# Patient Record
Sex: Female | Born: 1985
Health system: Southern US, Community
[De-identification: ages and names within clinical notes are randomized; demographics above are authoritative.]

## PROBLEM LIST (undated history)

## (undated) DIAGNOSIS — K219 Gastro-esophageal reflux disease without esophagitis: Secondary | ICD-10-CM

## (undated) DIAGNOSIS — F419 Anxiety disorder, unspecified: Secondary | ICD-10-CM

## (undated) DIAGNOSIS — U071 COVID-19: Secondary | ICD-10-CM

## (undated) DIAGNOSIS — M545 Low back pain: Secondary | ICD-10-CM

## (undated) DIAGNOSIS — R87619 Unspecified abnormal cytological findings in specimens from cervix uteri: Secondary | ICD-10-CM

## (undated) DIAGNOSIS — G43909 Migraine, unspecified, not intractable, without status migrainosus: Secondary | ICD-10-CM

## (undated) DIAGNOSIS — T7840XA Allergy, unspecified, initial encounter: Secondary | ICD-10-CM

## (undated) DIAGNOSIS — G8929 Other chronic pain: Secondary | ICD-10-CM

## (undated) DIAGNOSIS — I499 Cardiac arrhythmia, unspecified: Secondary | ICD-10-CM

## (undated) DIAGNOSIS — G459 Transient cerebral ischemic attack, unspecified: Secondary | ICD-10-CM

## (undated) DIAGNOSIS — R011 Cardiac murmur, unspecified: Secondary | ICD-10-CM

## (undated) DIAGNOSIS — K8020 Calculus of gallbladder without cholecystitis without obstruction: Secondary | ICD-10-CM

## (undated) HISTORY — PX: BREAST ENHANCEMENT SURGERY: SHX7

## (undated) HISTORY — DX: Unspecified abnormal cytological findings in specimens from cervix uteri: R87.619

## (undated) HISTORY — PX: ENDOSCOPIC RETROGRADE CHOLANGIOPANCREATOGRAPHY (ERCP) WITH PROPOFOL: SHX5810

## (undated) HISTORY — DX: Low back pain: M54.5

## (undated) HISTORY — DX: Allergy, unspecified, initial encounter: T78.40XA

## (undated) HISTORY — DX: Transient cerebral ischemic attack, unspecified: G45.9

## (undated) HISTORY — DX: Calculus of gallbladder without cholecystitis without obstruction: K80.20

## (undated) HISTORY — PX: GALLBLADDER SURGERY: SHX652

## (undated) HISTORY — PX: ABDOMINOPLASTY: SUR9

## (undated) HISTORY — PX: OTHER SURGICAL HISTORY: SHX169

## (undated) HISTORY — DX: Cardiac murmur, unspecified: R01.1

## (undated) HISTORY — DX: Other chronic pain: G89.29

## (undated) HISTORY — DX: COVID-19: U07.1

## (undated) HISTORY — DX: Anxiety disorder, unspecified: F41.9

---
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
## 2004-06-08 ENCOUNTER — Other Ambulatory Visit: Admission: RE | Admit: 2004-06-08 | Discharge: 2004-06-08 | Payer: Self-pay | Admitting: Obstetrics & Gynecology

## 2005-08-25 ENCOUNTER — Ambulatory Visit (HOSPITAL_COMMUNITY): Admission: AD | Admit: 2005-08-25 | Discharge: 2005-08-25 | Payer: Self-pay | Admitting: Obstetrics and Gynecology

## 2005-08-30 ENCOUNTER — Inpatient Hospital Stay (HOSPITAL_COMMUNITY): Admission: AD | Admit: 2005-08-30 | Discharge: 2005-08-31 | Payer: Self-pay | Admitting: Obstetrics and Gynecology

## 2005-09-03 ENCOUNTER — Ambulatory Visit (HOSPITAL_COMMUNITY): Admission: AD | Admit: 2005-09-03 | Discharge: 2005-09-03 | Payer: Self-pay | Admitting: Obstetrics and Gynecology

## 2005-09-08 ENCOUNTER — Inpatient Hospital Stay (HOSPITAL_COMMUNITY): Admission: AD | Admit: 2005-09-08 | Discharge: 2005-09-10 | Payer: Self-pay | Admitting: Obstetrics and Gynecology

## 2005-09-09 ENCOUNTER — Encounter: Payer: Self-pay | Admitting: Obstetrics and Gynecology

## 2005-09-28 ENCOUNTER — Emergency Department (HOSPITAL_COMMUNITY): Admission: EM | Admit: 2005-09-28 | Discharge: 2005-09-28 | Payer: Self-pay | Admitting: Emergency Medicine

## 2005-10-05 ENCOUNTER — Emergency Department (HOSPITAL_COMMUNITY): Admission: EM | Admit: 2005-10-05 | Discharge: 2005-10-05 | Payer: Self-pay | Admitting: Emergency Medicine

## 2005-10-08 ENCOUNTER — Ambulatory Visit (HOSPITAL_COMMUNITY): Admission: RE | Admit: 2005-10-08 | Discharge: 2005-10-08 | Payer: Self-pay | Admitting: Obstetrics and Gynecology

## 2005-11-09 ENCOUNTER — Emergency Department (HOSPITAL_COMMUNITY): Admission: EM | Admit: 2005-11-09 | Discharge: 2005-11-09 | Payer: Self-pay | Admitting: Emergency Medicine

## 2006-01-29 ENCOUNTER — Ambulatory Visit (HOSPITAL_COMMUNITY): Admission: RE | Admit: 2006-01-29 | Discharge: 2006-01-29 | Payer: Self-pay | Admitting: Family Medicine

## 2008-06-19 ENCOUNTER — Emergency Department (HOSPITAL_COMMUNITY): Admission: EM | Admit: 2008-06-19 | Discharge: 2008-06-19 | Payer: Self-pay | Admitting: Emergency Medicine

## 2008-07-02 ENCOUNTER — Emergency Department (HOSPITAL_COMMUNITY): Admission: EM | Admit: 2008-07-02 | Discharge: 2008-07-02 | Payer: Self-pay | Admitting: Emergency Medicine

## 2008-08-01 ENCOUNTER — Emergency Department (HOSPITAL_COMMUNITY): Admission: EM | Admit: 2008-08-01 | Discharge: 2008-08-01 | Payer: Self-pay | Admitting: Emergency Medicine

## 2008-09-14 ENCOUNTER — Other Ambulatory Visit: Admission: RE | Admit: 2008-09-14 | Discharge: 2008-09-14 | Payer: Self-pay | Admitting: Obstetrics & Gynecology

## 2009-05-04 ENCOUNTER — Emergency Department (HOSPITAL_COMMUNITY): Admission: EM | Admit: 2009-05-04 | Discharge: 2009-05-04 | Payer: Self-pay | Admitting: Emergency Medicine

## 2009-11-06 ENCOUNTER — Other Ambulatory Visit: Admission: RE | Admit: 2009-11-06 | Discharge: 2009-11-06 | Payer: Self-pay | Admitting: Obstetrics & Gynecology

## 2010-10-10 ENCOUNTER — Other Ambulatory Visit: Admission: RE | Admit: 2010-10-10 | Discharge: 2010-10-10 | Payer: Self-pay | Admitting: Obstetrics and Gynecology

## 2011-02-22 ENCOUNTER — Emergency Department (HOSPITAL_COMMUNITY)
Admission: EM | Admit: 2011-02-22 | Discharge: 2011-02-22 | Disposition: A | Discharge status: Home / Self Care | Payer: Self-pay | Attending: Emergency Medicine | Admitting: Emergency Medicine

## 2011-02-22 ENCOUNTER — Emergency Department (HOSPITAL_COMMUNITY): Payer: Self-pay

## 2011-02-22 DIAGNOSIS — M25579 Pain in unspecified ankle and joints of unspecified foot: Secondary | ICD-10-CM | POA: Insufficient documentation

## 2011-04-09 LAB — GC/CHLAMYDIA PROBE AMP, GENITAL
Chlamydia, DNA Probe: NEGATIVE
GC Probe Amp, Genital: NEGATIVE

## 2011-04-09 LAB — URINALYSIS, ROUTINE W REFLEX MICROSCOPIC
Hgb urine dipstick: NEGATIVE
Nitrite: NEGATIVE
Protein, ur: NEGATIVE mg/dL
Urobilinogen, UA: 0.2 mg/dL (ref 0.0–1.0)

## 2011-05-09 ENCOUNTER — Other Ambulatory Visit (HOSPITAL_COMMUNITY)
Admission: RE | Admit: 2011-05-09 | Discharge: 2011-05-09 | Disposition: A | Discharge status: Home / Self Care | Payer: Medicaid Other | Source: Ambulatory Visit | Attending: Obstetrics & Gynecology | Admitting: Obstetrics & Gynecology

## 2011-05-09 ENCOUNTER — Other Ambulatory Visit: Payer: Self-pay | Admitting: Obstetrics & Gynecology

## 2011-05-09 DIAGNOSIS — Z113 Encounter for screening for infections with a predominantly sexual mode of transmission: Secondary | ICD-10-CM | POA: Insufficient documentation

## 2011-05-09 DIAGNOSIS — Z01419 Encounter for gynecological examination (general) (routine) without abnormal findings: Secondary | ICD-10-CM | POA: Insufficient documentation

## 2011-05-17 NOTE — Discharge Summary (Signed)
[facility]  Patient:    Marissa Burnett, Marissa Burnett Visit Number: [id number] MRN: [id number]          Service Type: OBS Location: 4A A418 01 Attending Physician:  Jeri Cos. Dictated by:   Langley Gauss, M.D. Admit Date:  10/28/2001 Discharge Date: 10/30/2001   CC:         Dennie Maizes, M.D., [facility]   Discharge Summary  DIAGNOSES: 1. Twenty-four-week intrauterine pregnancy. 2. Right pyelonephritis. 3. Marked moderate-to-severe right hydronephrosis.  CONSULTATIONS OBTAINED:  Dr. Dennie Maizes was consulted and he saw the patient on October 29, 2001, at which time, on the same date, he took the patient to the operating room.  Utilizing general anesthesia, a stent was placed within the right ureter.  Patient did well postoperatively.  She had no postoperative complications.  She was continued on Rocephin 1 g IV q.12h. postoperatively.  Patient did have remarkable improvement in the pain following stent placement such that on October 30, 2001, patient was discharged home.  She had not taken any pain medication postoperatively.  PERTINENT LABORATORY STUDIES:  A urine culture obtained upon admission did reveal 50,000 colonies per milliliter of mixed bacteria.  Hemoglobin 10.7, hematocrit 31.3, white blood count of 5.8.  Urinalysis pertinent for negative hematuria, negative nitrite, negative esterase.  HOSPITAL COURSE:  Patient was seen in the office October 28, 2001 with continued right flank pain which had increased over the previous several days. Patient had been afebrile and had no urinary tract symptoms.  She was taking p.o. antibiotics at that time but had difficulty taking these and vomited several of these recently.  She likewise was on p.o. Tylox which failed to continue to give therapeutic results.  Patient had a renal ultrasound performed on October 28, 2001, with the final report being marked-to-severe right hydronephrosis,  thus patient was referred to [facility] for [facility] on October 28, 2001.  Dr. Rito Ehrlich was consulted and he saw the patient on October 29, 2001.  Upon admission on October 28, 2001, patient required IV Demerol and IV Phenergan for pain relief.  She, however, did remain afebrile.  She was treated with Rocephin 1 g IV q.12h.  She remained afebrile, voided without difficulty.  She had no gross hematuria.  The stent was placed on October 29, 2001 without difficulty.  Patient was prepared for discharge on October 30, 2001. Dictated by:   Langley Gauss, M.D.  Attending Physician:  Jeri Cos. DD:  10/30/01 TD:  11/02/01 Job: [id number]

## 2011-05-17 NOTE — H&P (Signed)
NAMEIRISA, GRIMSLEY            ACCOUNT NO.:  [id number]   MEDICAL RECORD NO.:  [id number]          PATIENT TYPE:  OIB   LOCATION:  LDR2                          FACILITY:  [facility]   PHYSICIAN:  Tilda Burrow, M.D. DATE OF BIRTH:  January 21, 1986   DATE OF ADMISSION:  09/08/2005  DATE OF DISCHARGE:  LH                                HISTORY & PHYSICAL   ADMISSION DIAGNOSES:  1.  Pregnancy, 38-1/2 weeks' gestation.  2.  Prodromal labor.   HISTORY OF PRESENT ILLNESS:  This 25 year old female, gravida 6, para 1, AB  4, LMP December 13, 2004, placing menstrual [facility] September 19, 2005, is seen  in labor and delivery after pregnancy course followed through our office  through 16 prenatal visits so far and several visits to labor and delivery.  She has had lots of prodromal labor symptoms over the last few weeks.  She  presents tonight with the strongest contractions yet.  Cervix is 3 cm, 20%,  -2, by nursing evaluation, 2-3 cm by my exam.  Contractions were more  regular than they have been on prior visits to labor and delivery with  contractions every 3 minutes, tolerated adequately by the patient who claims  epidural once labor gets going.  The patient is admitted and after  discussion, plans are to insert the Foley balloon overnight and if labor  does not spontaneously result, proceed with Pitocin in a.m.   PAST MEDICAL HISTORY:  Benign.   PAST SURGICAL HISTORY:  Cholecystectomy.   ALLERGIES:  PENICILLIN.   GYN HISTORY:  Positive for abnormal Pap smear requiring colposcopy.   LABORATORY DATA:  Prenatal labs include blood type O positive.  Urine drug  screen negative.  Rubella immune.  _present__Hemoglobin 13, hematocrit 39.  Hepatitis, HIV, GC, Chlamydia, RPR, MSA between 1 and 4300.  The patient's  partner, Kathryne Eriksson, is supportive.  She desires epidural.   PLAN:  Balloon dilation overnight, Pitocin in a.m.  Epidural for analgesia.      Tilda Burrow, M.D.  Electronically Signed     JVF/MEDQ  D:  09/08/2005  T:  09/08/2005  Job:  [id number]   cc:   Donna Bernard, M.D.  [address]  [place]  [place] [address]  Fax: [phone]
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
## 2011-05-17 NOTE — Op Note (Signed)
[name], [name]            ACCOUNT NO.:  [id number]   MEDICAL RECORD NO.:  [id number]          PATIENT TYPE:  INP   LOCATION:  LDR2                          FACILITY:  [facility]   PHYSICIAN:  Tilda Burrow, M.D. DATE OF BIRTH:  Jul 11, 1986   DATE OF PROCEDURE:  DATE OF DISCHARGE:                                  PROCEDURE NOTE   DELIVERY NOTE:  Onset of labor on September 09, 2005.  Date of delivery  September 09, 2005, at 7:57 a.m.  Length of first stage labor 3 hours and 52  minutes.  Length of second stage of labor 5 minutes.  Length of third stage  labor 3 minutes.   Marissa Burnett had a normal spontaneous vaginal delivery of a viable female infant  weighing 8 pounds 1 ounce with Apgar's 9 and 9.  Upon delivery of head, nose  and mouth were thoroughly suctioned with DeLee suctioning on the perineum  due to light meconium staining.  Approximately 4 mL of very light meconium  fluid was noted in the DeLee suction.  Shoulders were rotated and delivered  spontaneously without difficulty.  The infant was suctioned again, dried,  cord clamped and cut and to nurses for newborn care.  Third stage of labor  was actually managed with 20 units of Pitocin and 1000 mL of D-5LR at a  rapid rate.  The placenta was delivered spontaneously.  Three-cord was noted  upon inspection.  Membranes were noted to be intact upon inspection.  Estimated blood loss approximately 300 mL.  Perineum was noted to be intact  upon inspection.  Epidural catheter was removed with blue tip intact.  The  placenta will be sent to pathology for evaluation due to meconium staining.      Marissa Burnett, Marissa Burnett      Tilda Burrow, M.D.  Electronically Signed    DL/MEDQ  D:  91/[id number]/[id number]  T:  09/09/2005  Job:  [id number]

## 2011-05-17 NOTE — Discharge Summary (Signed)
Marissa Burnett, Marissa Burnett            ACCOUNT NO.:  [id number]   MEDICAL RECORD NO.:  [id number]          PATIENT TYPE:  INP   LOCATION:  A415                          FACILITY:  [facility]   PHYSICIAN:  Lazaro Arms, M.D.   DATE OF BIRTH:  [birth year]   DATE OF ADMISSION:  08/30/2005  DATE OF DISCHARGE:  LH                                 DISCHARGE SUMMARY   LABOR AND DELIVER OBSERVATION NOTE:  She came in on September 1 and left on  September 2.   Marissa Burnett is a 25 year old gravida 6, para 1, abortion 2, miscarriage 2 with  estimated deliver of September 19, 2005, currently at 37-2/[redacted] weeks gestation  presenting complaining of irregular uterine contractions. She came in having  irregular contractions anywhere from 2 to 14 minutes. They were very mild.  There was no rupture of membranes, no bleeding, good fetal movement. She got  a reactive NST. She was 2 cm when she came in, thick and posterior. We kept  her basically all night. Approximately 12 hours later, her cervix was  unchanged, and again, she was having just irregular uterine activity. Baby  was reactive, and no other changes in her clinical status. As a result, she  is discharged to home to follow up in the office as scheduled and given  labor precautions and instructions.      Lazaro Arms, M.D.  Electronically Signed     LHE/MEDQ  D:  08/31/2005  T:  08/31/2005  Job:  [id number]

## 2011-05-17 NOTE — Discharge Summary (Signed)
[facility]  Patient:    Marissa Burnett, Marissa Burnett Visit Number: [id number] MRN: [id number]          Service Type: OBS Location: 4A A418 01 Attending Physician:  Jeri Cos. Dictated by:   Langley Gauss, M.D. Admit Date:  10/28/2001 Discharge Date: 10/30/2001                             Discharge Summary  DISCHARGE DIAGNOSES:  A 24 week intrauterine pregnancy with right pyelonephritis complicated by moderate to severe hydronephrosis.  The patient was admitted and was treated with IV Rocephin.  Dr. Dennie Maizes, was consulted who placed a stent in the patient on October 29, 2001. On October 30, 2001, the patient was prepared for discharge.  Final cultures:  50,000 mixed culture.  DISCHARGE MEDICATIONS: 1. Keflex 500 mg p.o. b.i.d. x 7 days to be followed by 2. Macrodantin 100 mg p.o. q.h.s. suppressive therapy.  On October 28, 2001, initial hospital care [id number], 50 minutes spent with the patient and on the floor.  On October 29, 2001, subsequent inpatient services [phone].  On October 30, 2001, hospital discharge services [phone]. Dictated by:   Langley Gauss, M.D. Attending Physician:  Jeri Cos. DD:  11/02/01 TD:  11/03/01 Job: [id number]

## 2011-05-17 NOTE — Op Note (Signed)
[facility]  Patient:    Marissa Burnett, Marissa Burnett Visit Number: [id number] MRN: [id number]          Service Type: OBS Location: 4A A418 01 Attending Physician:  Jeri Cos. Dictated by:   Langley Gauss, M.D. Proc. Date: 01/27/02 Admit Date:  01/27/2002                             Operative Report  PROCEDURE: Placement of continuous lumbar epidural analgesia at the L3-L4 interspace, performed by Langley Gauss, M.D.  COMPLICATIONS:  None.  SUMMARY: With the outset of active labor and ______ uterine contractions, the patient requested epidural analgesic, continuous, and electronic fetal monitoring was performed.  The risks and benefits of the procedure had been discussed during the patients prenatal course. She was placed in the seated position, at which time bony landmarks were identified. The L3-L4 interspace was chosen.  The patients back was sterilely prepped and draped utilizing the epidural kit.  5 cc of 1% lidocaine were injected at the midline of the L3-L4 interspace to raise a small skin wheal.  The gauge Tuohy-Schliff needle was then utilized with loss of resistance, and air-filled ______ identified entry into the epidural space on the first attempt ______.  Excellent loss of resistance was noted, consistent with entry into the epidural space.  Initial test dose of 5 cc of 1.5% lidocaine plus epinephrine injected through the epidural needle.  No signs of CSF or intravascular injection obtained.  With this injection, thus the catheter was inserted to a depth of about 4 cm, and the epidural needle was removed.  Aspiration test was negative.  A second test dose, 2 cc of 1.5% lidocaine plus epinephrine injected through the epidural catheter.  Again, no signs of CSF or intravascular injection obtained, the patient having tingling in the buttocks bilaterally, consistent with a proper setting up by epidural block.  Likewise, she was noted to have  appropriate changes in blood pressure.  The catheter was secured into place.  The patient was connected to the infusion pump containing the standard mixture.  She was treated with a bolus of 10 cc, followed by continuous infusion rate of 14 cc per hour.  Upon return to the bed in the left lateral position, the patient was noted to have significant heaviness in the legs, consistent with a properly setting up epidural block, as well as onset of effective labor analgesia. The patient had felt pressure during placement of the epidural. Examination immediately following placement revealed the cervix to be 8 cm dilated, vertex at +1 station, and completely effaced with a continuing reassuring fetal heart rate.  The patient is effectively contracting q. 3-5 minutes at this time.  Now, she is allowed to continue to labor with expectation that the labor will be progressing rapidly. Dictated by:   Langley Gauss, M.D. Attending Physician:  Jeri Cos. DD:  01/29/02 TD:  01/29/02 Job: [phone]

## 2011-05-17 NOTE — H&P (Signed)
[facility]  Patient:    Marissa Burnett, Marissa Burnett Visit Number: [id number] MRN: [id number]          Service Type: OBS Location: 4A A428 01 Attending Physician:  Jeri Cos. Dictated by:   Christin Bach, M.D. Admit Date:  10/22/2001   CC:         Langley Gauss, M.D.   History and Physical  CHIEF COMPLAINT: Back pain, nausea and vomiting, low-grade temperature.  HISTORY OF PRESENT ILLNESS: This 25 year old female, gravida 2 para 0 AB 1, LMP May 03, 2001, placing menstrual [facility] February 10, 2002, placing her now at [redacted] weeks gestation, is admitted after presenting to labor and delivery complaining of nausea and vomiting and back pain throughout the day.  The patient is found on admission to have low-grade temperature of 99.2 degrees, pulse 102, with urinalysis showing presence of urine nitrites and estrace, with negative protein and negative blood.  Physical examination is notable for mild right-sided CVA tenderness.  The patient is admitted for early pyelonephritis.  Prenatal course has been followed through Dr. Royetta Crochet office, and notable for birth control pills taken around the time of conception, with several pills forgotten.  The patient had a history of kidney infection at age [age].  Prenatal laboratories included GC and Chlamydia culture negative, hemoglobin 12, hematocrit 36, blood type O-positive, antibody screen negative. hepatitis and HIV all negative.  Rubella immune to present.  Triple screen negative.  ALLERGIES:  1. PENICILLIN (?).  2. CODEINE (?).  PHYSICAL EXAMINATION:  GENERAL: Generally healthy-appearing Caucasian female, alert and oriented x 3.  HEENT: PERRL.  EOMI.  NECK: Supple.  Trachea midline.  CHEST: Clear to auscultation.  ABDOMEN: Nontender.  BACK: Right CVA tenderness.  PELVIC: External monitoring shows no uterine contractions.  Cervical examination by nurse shows the cervix to be  closed.  EXTREMITIES: Grossly normal.  ASSESSMENT: Right pyelonephritis, mild.  PLAN: IV antibiotics x 24-48 hours. Dictated by:   Christin Bach, M.D. Attending Physician:  Jeri Cos. DD:  10/22/01 TD:  10/24/01 Job: [id number]

## 2011-05-17 NOTE — Consult Note (Signed)
NAMEMADDELYN, ROCCA            ACCOUNT NO.:  [id number]   MEDICAL RECORD NO.:  [id number]          PATIENT TYPE:  OIB   LOCATION:  LDR1                          FACILITY:  [facility]   PHYSICIAN:  Tilda Burrow, M.D. DATE OF BIRTH:  08/16/1986   DATE OF CONSULTATION:  DATE OF DISCHARGE:                                   CONSULTATION   OBSERVATION NOTE:  Observation x2+ hours 1557 to 1977.   CHIEF COMPLAINT:  Contractions overnight, nausea and diarrhea.   HISTORY AND PHYSICAL:  [age]-year-old female gravida 6, para 1, AB 4 now  at 67 plus 4 weeks who presents with overnight discomforts beginning with  irregular contractions last night developing some nausea and loose bowel  movements.  She presented at 7 a.m. with complaints of vomiting x3 and the  mild contractions without bleeding or gush of fluid.  Fetal monitoring shows  excellent reactivity.  Exam by nurse showed cervix to be firm, long, closed,  unchanged from July 26, 2005 evaluation in our office.  Prenatal course is  noted in the prenatal records and records are reviewed.  She had a history  of Chlamydia noted on her 28-week labs which was treated on August 12, 2005  and proof of cure obtained August 21, 2005.  The results of that are  pending.  After 2 hours of monitoring the contractions have not increased,  the nausea has resolved, she has received oral Phenergan and kept it down  with increased comfort.  The plan is to discharge home with prescription  given for Phenergan 25 mg p.o. q.6 h. (dispense 10 tablets) with full liquid  diet for the day anticipate resolution of GI symptoms.  Return p.r.n.  deterioration of condition or increasing contractions.      Tilda Burrow, M.D.  Electronically Signed     JVF/MEDQ  D:  08/25/2005  T:  08/25/2005  Job:  [id number]

## 2011-05-17 NOTE — Op Note (Signed)
[facility]  Patient:    Marissa Burnett, Marissa Burnett Visit Number: [id number] MRN: [id number]          Service Type: OBS Location: 4A A418 01 Attending Physician:  Jeri Cos. Dictated by:   Langley Gauss, M.D. Proc. Date: 01/27/02 Admit Date:  01/27/2002                             Operative Report  DELIVERY PROCEDURE NOTE  DELIVERY PERFORMED BY:  Langley Gauss, M.D.  DIAGNOSIS:  A 38-week intrauterine pregnancy, in labor.  PROCEDURES: 1. Delivery form:  Spontaneous assisted vaginal delivery of a 7 pound 2 ounce    female infant. 2. Midline episiotomy and repair.  ANALGESIA:  Continuous lumbar epidural supplemented with 20 cc of 1% lidocaine in the midline of the perineal body.  SPECIMENS:  Arterial cord gas and cord blood to pathology and laboratory. Placenta is examined and noted to be apparently intact with a three-vessel umbilical cord.  SUMMARY:  The patient had been seen in the office on this date, January 27, 2002, at [redacted] weeks gestation, complaining of onset of uterine contractions since 0600.  She was referred to [facility], at which time she was noted to have contractions q.3-72m. throughout the day with associated cervical change to 3 cm.  Thus the patient was admitted in early labor.  Fetal scalp electrode was placed with resultant amniotomy.  The patient had a reassuring fetal heart rate.  Following amniotomy, spontaneously entered active labor.  With onset of painful uterine contractions, the patient requested epidural anesthesia, which was placed immediately.  Epidural functioned very well throughout the remainder of the labor course.  After placement of the epidural, the patient was noted to be 8 cm dilated with vertex at a +1 station.  The patient thereafter had effective labor analgesia and quickly progressed.  With onset of significant pelvic pressure, the patient was examined and noted to be completely dilated at a +1  station.  Thus, with continued pressure associated with uterine contractions, the patient began pushing with descent of the vertex to the +2 station.  She was placed in the dorsal lithotomy position and prepped and draped in the usual sterile manner.  She continued to push with easy descent of the vertex to the pelvic floor with distention of the perineum, and 20 cc of 1% lidocaine is injected.  A small midline episiotomy was performed.  The patient then pushed well with delivery in a direct OA position over the midline episiotomy without extension.  Mouth and ears of the infant were bulb-suctioned of clear amniotic fluid.  With good expulsive efforts that resulted in spontaneous rotation to a left anterior shoulder position, gentle abdominal traction combined with expulsive efforts resulted in delivery of the shoulder on pubic symphysis as well as the remainder of the infant without difficulty.  Spontaneous and vigorous breathe and cry is noted, and the umbilical cord is noted toward the infant.  The cord was doubly clamped and cut and infant is placed on the maternal abdomen for immediate bonding purposes.  Cord gas and cord blood were then obtained.  Gentle traction on the umbilical cord results in separation, which on examination appears to be intact three-vessel placenta and associated cord.  Excellent uterine tone is achieved immediately following delivery.  Examination of the genital tract reveals no lacerations, the midline episiotomy was not extended. This is easily repaired utilizing 0 chromic in a running  locked fashion on the vaginal mucosa, followed by two-layer closure of 0 chromic on the perineal body.  Following the repair, the patient is taken out of the lithotomy position and rolled to her side, at which time the epidural catheter is removed with the blue tip noted to be intact.  _____ to the nursing staff. The patient herself does plan on bottle-feeding.  She will be  utilizing [facility] for newborn pediatric care. Dictated by:   Langley Gauss, M.D. Attending Physician:  Jeri Cos. DD:  01/29/02 TD:  01/29/02 Job: [id number]

## 2011-05-17 NOTE — Op Note (Signed)
[facility]  Patient:    LESHEA, JAGGERS Visit Number: [id number] MRN: [id number]          Service Type: OBS Location: 4A A418 01 Attending Physician:  Jeri Cos. Dictated by:   Dennie Maizes, M.D. Proc. Date: 10/29/01 Admit Date:  10/28/2001 Discharge Date: 10/30/2001   CC:         Langley Gauss, M.D.   Operative Report  PREOPERATIVE DIAGNOSES:  Right hydronephrosis, pregnancy, history of acute right pyelonephritis.  POSTOPERATIVE DIAGNOSES:  Right hydronephrosis, pregnancy, history of acute right pyelonephritis.  OPERATIVE PROCEDURE:  Cystoscopy and right ureteral stent placement.  ANESTHESIA:  Spinal.  SURGEON:  Dennie Maizes, M.D.  COMPLICATIONS:  None.  CARDIAC:  A 6-French 26-cm size right ureteral stent.  INDICATION FOR THE PROCEDURE:  This 25 year old female is [redacted] weeks pregnant. She was treated for acute pyelonephritis last week.  She was admitted to the hospital with severe right flank pain and renal ultrasound revealed moderate-to-severe right hydronephrosis.  The patient is taken to the OR today for cystoscopy and right ureteral stent placement with ultrasound guidance.  DESCRIPTION OF PROCEDURE:  Spinal anesthesia was induced and the patient was placed on the OR table in the dorsal lithotomy position.  The lower abdomen and genitalia were prepped and draped in a sterile fashion.  Cystoscopy was done with a 25-French scope.  Appearance of bladder was normal.  There was mild hyperemia of the bladder mucosa, especially over the posterior bladder wall.  A 6-French open-ended catheter was then inserted into the right collecting system up to the level of about 20 cm.  A 0.0318 stent and guidewire with a flexible tip were now advanced into the renal pelvis. Ultrasound monitoring of the right kidney was done and the tip of the guidewire was found to be inside the renal pelvis.  The open-ended catheter was then removed.   A 6-French 26-cm size stent was then inserted over the guidewire into the collecting system.  The upper end of the stent could be easily identified in the renal pelvis.  The instruments were removed.  The patient was transferred to the PACU in a satisfactory condition. Dictated by:   Dennie Maizes, M.D. Attending Physician:  Jeri Cos. DD:  10/29/01 TD:  10/30/01 Job: [id number]

## 2011-05-17 NOTE — Op Note (Signed)
[facility]  Patient:    Marissa Burnett, Marissa Burnett Visit Number: [id number] MRN: [id number]          Service Type: DSU Location: DAY Attending Physician:  Jonathon Bellows Dictated by:   Roetta Sessions, M.D. Proc. Date: 03/25/02 Admit Date:  03/25/2002 Discharge Date: 03/25/2002   CC:         Dr. Justice Britain, [facility], [place], [place]   Operative Report  PREOPERATIVE DIAGNOSIS:  POSTOPERATIVE DIAGNOSIS:  OPERATION/PROCEDURE:  Endoscopic retrograde cholangiopancreatography with stent removal.  SURGEON:  Roetta Sessions, M.D.  INDICATIONS:  The patient is a 25 year old lady who presented to [facility] a few weeks ago with biliary colic.  She had symptomatic cholelithiasis and cholecystitis and evidence of a common duct stone.  She underwent ERCP by me.  She was found to have a common duct stone.  She underwent a sphincterotomy with balloon dredging.  The duct did not drain well and there was additional debris, in the duct, which could not be removed.  A 5-cm, 7 French stent was placed.  She did well.  She now returns for stent removal.  This approach has been discussed with the patients mother and the patient at length.  Just as before, the potential risks, benefits, and alternatives have been reviewed.  Specifically, the risk for perforation, pancreatitis, and reaction to medications.  DESCRIPTION OF PROCEDURE:  The patient was placed in the semiprone position on the fluoroscopy table.  She was given Versed 4 mg IV, Demerol 100 mg IV in divided doses.  She also received Phenergan 25 mg IV slowly over 10 minutes at the onset of the procedure.  Cetacaine spray for topical oropharyngeal anesthesia, and Levaquin 250 mg IV prior to procedure.  INSTRUMENT:  Olympus videochip side-viewing duodenoscope.  FINDINGS:  Cursory examination of the distal esophagus, stomach, and duodenum to the second portion appeared normal size and a 7 [place]  stent protruding into the ampullary orifice was evident from prior sphincterotomy.  The scope was pulled back to the short position; 65 cm from the incisors a scout film was taken.  Using the snare through the scope the stent was grasped and pulled out of the patient with the scope.  The scope was reintroduced into the duodenum.  Using the Bard sphincterotome the bile duct was easily cannulated.  Cholangiogram was obtained.  The residual biliary tree appeared normal.  There was no stricture or persistent filling defect.  The bile duct was not dilated.  The patient is status post sphincterotomy.  The bile freely flowed from the antral area orifice. Dr. Jean Rosenthal was present for fluoroscopy and agrees with the above assessment; and we have reviewed the films, after the procedure, and the films are consistent with the fluoroscopic images.  The patient tolerated the procedure well and was reacted in endoscopy.  IMPRESSION:  Normal appearing residual biliary tree, status post stent removal and prior laparoscopic cholecystectomy.  No residual filling defect.  Duct appeared to drain well.  RECOMMENDATIONS:  Clear liquids this afternoon, then advance to a light supper this evening then on to a regular diet tomorrow.  She is to call me if she has any problems.  I feel that her outlook is excellent. Dictated by:   Roetta Sessions, M.D. Attending Physician:  Jonathon Bellows DD:  03/25/02 TD:  03/27/02 Job: [id number] MW/[id number]

## 2011-05-17 NOTE — Op Note (Signed)
NAMEMORISSA, OBEIRNE            ACCOUNT NO.:  [id number]   MEDICAL RECORD NO.:  [id number]          PATIENT TYPE:  OIB   LOCATION:  LDR2                          FACILITY:  [facility]   PHYSICIAN:  Tilda Burrow, M.D. DATE OF BIRTH:  [birth year]   DATE OF PROCEDURE:  09/09/2005  DATE OF DISCHARGE:                                 OPERATIVE REPORT   PROCEDURE PERFORMED:  Epidural catheter placement.   Continuous lumbar epidural catheter placed using loss of resistance  technique after the patient was placed in sitting position, flexed forward  with some difficulty with patient cooperation.  Lydiann is quite thick with  lots of back edema.  It was technically challenging to identify epidural  space.  There was absolutely no sensation of identifying the posterior  spinous processes.  At the third epidural site attempt, we were eventually  able to identify an interspace and at the depth of 7 cm, we were able to  achieve a loss of resistance sensation desired at 7 cm depth.  The 5 mL test  dose was administered and the epidural catheter threaded easily 4 cm into  the epidural space and taped to the skin.  The patient had symmetric  analgesic effect at T10, achieved in a rather prompt fashion with motor  ability of moving the legs still intact.  This was felt to represent  effective epidural.  The epidural consisted of 10 mL bolus initially and 12  mL per hour.  Fetal heart rate remained stable with no blood pressure drop.  The patient tolerated the procedure well and proceeded in labor.  Cervical  exam post catheter showed the cervix to be 7 cm 85% effaced, -2 station  vertex.  The fore waters were generous and upon rupture showed light green  meconium discoloration with no particulate material.  Scalp electrode was  placed for fetal monitoring.      Tilda Burrow, M.D.  Electronically Signed     JVF/MEDQ  D:  09/09/2005  T:  09/09/2005  Job:  [id number]

## 2011-05-17 NOTE — H&P (Signed)
[facility]  Patient:    EMI, LYMON Visit Number: [id number] MRN: [id number]          Service Type: DSU Location: DAY Attending Physician:  Jonathon Bellows Dictated by:   Tana Coast, P.A. Admit Date:  03/25/2002 Discharge Date: 03/25/2002   CC:         Lilyan Punt, M.D.  Justice Britain, M.D.   History and Physical  DATE OF OFFICE VISIT:  March 15, 2002.  CHIEF COMPLAINT:  Follow-up after ERCP.  HISTORY OF PRESENT ILLNESS:  The patient is a pleasant 25 year old Caucasian female who presents today for follow-up.  She had an ERCP with sphincterotomy, balloon bridging in the bile duct, plastic stent placement on March 04, 2002. She had presented the day before with symptomatic cholelithiasis.  She was found to have elevated liver function studies.  An intraoperative cholangiogram demonstrated a filling defect in the distal common bile duct with apparent obstruction to flow of contrast in the duodenum.  The patients LFTs postprocedure revealed total protein 6.4, albumin 3.0, alkaline phosphatase 303, SGOT 53, SGPT 86, total bilirubin 4.5, direct bilirubin 3.1, amylase 81.  She presents today saying she is doing reasonably well.  She complains of several episodes a day of fleeting spasm-type pain in the right upper quadrant which has been occurring ever since her ERCP.  She probably is feeling the stent.  The pain usually only lasts five seconds at a time.  She is currently on antibiotic but does not recall the name.  She denies any nausea, vomiting, heartburn, constipation, diarrhea, melena, or rectal bleeding.  Appetite is very good.  She is due to get more LFTs this week as ordered by Dr. Gabriel Cirri.  CURRENT MEDICATIONS: 1. Lexapro 10 mg q.d. 2. Vicodin p.r.n., however, has not taken recently. 3. Tylox p.r.n., however, has not taken recently. 4. Antibiotics, unsure of name or dose.  ALLERGIES:  CODEINE and PENICILLIN.  REVIEW OF  SYSTEMS:  Cardiac:  Denies any chest pain or dyspnea.  CONSTITUTION: Denies fever or chills.  PHYSICAL EXAMINATION:  VITAL SIGNS:  Weight 196.  Blood pressure 110/60, pulse 80.  GENERAL:  Pleasant 25 year old Caucasian female who appears somewhat anxious.  SKIN:  Warm and dry.  No jaundice.  HEENT:  Conjunctivae pink.  Sclerae nonicteric.  Oropharyngeal mucosa moist and pink.  No lesions, erythema, or exudate.  LUNGS:  Clear to auscultation.  CARDIAC:  Regular rate and rhythm.  Normal S1, S2.  No murmurs, rubs, or gallops.  ABDOMEN:  Positive bowel sounds.  Obese but symmetrical.  Soft, nontender, nondistended.  No organomegaly or masses.  LABORATORY DATA:  On March 05, 2002, as stated in HPI.  IMPRESSION:  The patient is a 25 year old female who recently underwent laparoscopic cholecystectomy for cholelithiasis and was found to have an obstruction via intraoperative cholangiogram.  She subsequently underwent an endoscopic retrograde cholangiopancreatography with sphincterotomy, balloon bridging of the bile duct, plastic stent placement.  Her liver function tests continued to be up the day after the procedure, however, did show some improvement.  She is due for repeat liver function tests this week.  PLAN: 1. ERCP with stent removal next week. 2. NuLev #14, samples given to take 1 p.o. q.6h. p.r.n. abdominal spasms. 3. Will obtain a copy of LFTs to be done this week. 4. I discussed the risks, alternatives, and benefits with the patient as far    as ERCP with stent removal, and she is agreeable to proceed. Dictated by:  Tana Coast, P.A. Attending Physician:  Jonathon Bellows DD:  03/15/02 TD:  03/16/02 Job: [id number]

## 2011-05-17 NOTE — Op Note (Signed)
[facility]  Patient:    Marissa Burnett, Marissa Burnett Visit Number: [id number] MRN: [id number]          Service Type: OBS Location: 4A A418 01 Attending Physician:  Jeri Cos. Dictated by:   Langley Gauss, M.D. Admit Date:  01/27/2002                             Operative Report  DIAGNOSIS:  Intrauterine pregnancy at 38 weeks in labor.  PROCEDURES: 1. Delivery performed in a spontaneous assisted vaginal delivery of    7 pound 2 ounce female infant. 2. Midline episiotomy and repair.  ANALGESIA:  Continued lumbar epidural supplemented with 20 cc of 1% lidocaine in the midline of the peritoneal body.  SPECIMENS:  Arterial cord gas and cord blood to pathology laboratory.  The placenta is examined and noted to be apparently intact with a three vessel umbilical cord.  SUMMARY:  The patient had been seen in the office on this date, January 27, 2002, at [redacted] weeks gestation complaining of the onset of uterine contractions since 0600.  She was referred to [facility] at which time she was noted to have contractions every three to five minutes throughout the day with associated cervical change to 3 cm.  The patient was admitted in early labor. Fetal scalp electrode was placed with resultant amniotomy.  The patient had a reassuring fetal heart rate.  Following amniotomy, she spontaneously entered active labor.  With the onset of uterine contractions, the patient requested epidural analgesic which was placed immediately.  Epidural functioned very well throughout the remainder of the labor course.  After placement of the epidural, the patient was noted to be 8 cm dilated, vertex at a +1 station. The patient thereafter have effective labor analgesia and quickly progressed with the onset of subsequent pelvic pressure.  The patient was examined and noted to be completely dilated at a +1 station.  Thus with continued pressure associated with uterine contractions,  the patient began pushing with descent of the uterine vertex with +2 station, she was placed in the dorsal lithotomy position, prepped and draped in the usual sterile manner.  She continues to push with easy descent of the vertex to the pelvic floor with distention of the perineum and 20 cc of 1% lidocaine was injected.  A small midline episiotomy was performed.  The patient then pushed well and delivered in a direct OA position over the midline episiotomy without extension.  The mouth and nares of the infant were bulb suctioned of clear amniotic fluid.  We expulsive efforts this responded in spontaneous dilatation to a left anterior shoulder position.  General abdominal traction combined with expulsive efforts resulted in delivery of shoulder over the pubic symphysis as well as the remainder of the infant without difficulty.  Spontaneous and vigorous breathing and cry were noted.  The umbilical cord was clamped and cut and the infant was placed on the maternal abdomen for immediate bonding purposes. Cord blood and cord gas were then obtained.  Gentle traction on the umbilical cord resulted in separation which upon examination appears to be intact three vessel placenta and associated cord.  Uterine tolerance was achieved immediately following delivery.  Examination of the genital tract reveals no laceration.  The midline episiotomy was not extended.  This was easily repaired utilizing a 0 chromic in a running locked fashion in the vaginal mucosa followed by two layer closure of 0 chromic in  the peritoneal body. Following our repair, the patient was taken out of the lithotomy position, rolled toward her side at which time the epidural catheters were removed and a blue tip noted to be intact as per the nursing staff.  The patient does plan on bottle feeding.  She will be utilizing _____ [facility] for newborn pediatric care. Dictated by:   Langley Gauss, M.D. Attending Physician:   Jeri Cos. DD:  01/29/02 TD:  01/29/02 Job: [id number]

## 2011-05-17 NOTE — Consult Note (Signed)
[facility]  Patient:    Marissa Burnett, GITTO Visit Number: [id number] MRN: [id number]          Service Type: OBS Location: 4A A418 01 Attending Physician:  Jeri Cos. Dictated by:   Dennie Maizes, M.D. Proc. Date: 10/29/01 Admit Date:  10/28/2001 Discharge Date: 10/30/2001   CC:         Langley Gauss, M.D.   Consultation Report  REASON FOR CONSULTATION:  Right hydronephrosis, right flank pain, history of acute right pyelonephritis, pregnancy.  HISTORY:  This 25 year old female is [redacted] weeks pregnant at present.  She has right acute pyelonephritis.  She was hospitalized a week ago and treated with IV antibiotics.  She had recurrent pain, and she was treated in the hospital for pain control.  Urinalysis was unremarkable.  Further evaluation was done with renal ultrasound, and this revealed moderate to severe right hydronephrosis.  The patient denies having any fever, chills, or voiding difficulty at present.  There is no past history of urolithiasis or urinary tract infections.  PAST MEDICAL HISTORY:  History of pyelonephritis as a child.  No other medical problems.  MEDICATIONS:  None.  ALLERGIES:  CODEINE and PENICILLIN.  PHYSICAL EXAMINATION:  ABDOMEN:  Soft.  No palpable flank mass.  Moderate right costovertebral angle tenderness is noted.  Her 25-week pregnancy is noted.  Bladder not palpable.  ADMISSION LABORATORY DATA:  CBC: WBC 5.8, hemoglobin 10.7, hematocrit 31.3. Urinalysis negative, trace leukocyte esterase.  BUN 6, creatinine 0.6, electrolytes within normal limits.  Renal ultrasound revealed moderate to severe right hydronephrosis.  IMPRESSION: 1. Right hydronephrosis. 2. Right flank pain. 3. History of right pyelonephritis. 4. Pregnancy.  PLAN:  I have discussed with the patient and her family regarding the diagnosis and treatment options.  Discussed about cystoscopy and right ureteral stent placement with  ultrasound guidance, and the patient is agreeable.  I explained to them about the diagnosis, operative details, outcome, possible risks and complications, and they agree for the procedure to be done under anesthesia.  Thank you for this consult. Dictated by:   Dennie Maizes, M.D. Attending Physician:  Jeri Cos. DD:  10/29/01 TD:  10/30/01 Job: [id number]

## 2011-05-17 NOTE — Group Therapy Note (Signed)
NAMERYLAND, SMOOTS            ACCOUNT NO.:  [id number]   MEDICAL RECORD NO.:  [id number]          PATIENT TYPE:  OIB   LOCATION:  A415                          FACILITY:  [facility]   PHYSICIAN:  Lazaro Arms, M.D.   DATE OF BIRTH:  02-19-1986   DATE OF PROCEDURE:  09/03/2005  DATE OF DISCHARGE:                                   PROGRESS NOTE   SUBJECTIVE:  Bethannie got very good relief from the morphine IM, took a nap,  woke up and said she felt a gush of fluid about 5 inches in diameter come  out.  She came back to the hospital to rule out rupture of membranes.  Sterile speculum exam revealed no pooling, normal appearing physiological  fluid, Nitrazine was negative and ferning on two slides was negative.  Cervix is unchanged.  There are no contractions.   IMPRESSION:  Negative rupture of membranes.      Jacklyn Shell, C.N.M.      Lazaro Arms, M.D.  Electronically Signed    FC/MEDQ  D:  09/03/2005  T:  09/03/2005  Job:  [id number]   cc:   [facility]

## 2011-05-17 NOTE — Discharge Summary (Signed)
[facility]  Patient:    Marissa Burnett, Marissa Burnett Visit Number: [id number] MRN: [id number]          Service Type: OBS Location: 4A A418 01 Attending Physician:  Jeri Cos. Dictated by:   Langley Gauss, M.D. Admit Date:  01/27/2002 Discharge Date: 01/30/2002   CC:         [facility]   Discharge Summary  DIAGNOSES:  January 27, 2002:  A 25 year old gravida 2, para 0 at [redacted] weeks gestation who presents in labor.  PROCEDURE:  January 27, 2002:  Placement of continuous lumbar epidural analgesia, spontaneous assisted vaginal delivery of 7 pound 2 ounce female infant, midline episiotomy repair.  LABORATORIES:  O+ blood type.  Hemoglobin and hematocrit 8.5/24.3 on postpartum day #1.  Admission hemoglobin and hematocrit 9.9/28.8, white count 11.7.  Patient is bottle feeding at time of discharge.  She is utilizing [facility] for pediatric care.  Patient is given a copy of the standard discharge instructions at time of discharge.  Will follow up in four weeks time at which time we can discuss initiation of oral contraceptives for birth control purposes. Dictated by:   Langley Gauss, M.D. Attending Physician:  Jeri Cos. DD:  02/04/02 TD:  02/04/02 Job: [id number] IO/[id number]

## 2011-05-17 NOTE — Group Therapy Note (Signed)
NAMEDARE, SPILLMAN            ACCOUNT NO.:  [id number]   MEDICAL RECORD NO.:  [id number]          PATIENT TYPE:  OIB   LOCATION:  A415                          FACILITY:  [facility]   PHYSICIAN:  Lazaro Arms, M.D.   DATE OF BIRTH:  [birth year]   DATE OF PROCEDURE:  DATE OF DISCHARGE:                                   PROGRESS NOTE   Marissa Burnett came to labor and delivery this morning to rule out labor.  She is  about 37-plus weeks gestation.  She was found to be having contractions  every 3-4 minutes, mild in strength.  Fetal heart rate is reactive without  decelerations.  Her cervix is not changing and is still tight, two, thick,  posterior and high.  She was given the opportunity to walk around for  several hours and still do not have any cervical change.  Due to the  frequency of her contractions yet no cervical change, she was offered a  therapeutic rest, and she accepted.  She was given 14 mg of morphine IM and  discharged home with instructions to come back if the contractions get  stronger.      Jacklyn Shell, C.N.M.      Lazaro Arms, M.D.  Electronically Signed    FC/MEDQ  D:  09/03/2005  T:  09/03/2005  Job:  [id number]

## 2011-09-26 LAB — HERPES SIMPLEX VIRUS CULTURE: Culture: DETECTED

## 2011-09-26 LAB — STREP A DNA PROBE: Group A Strep Probe: NEGATIVE

## 2012-07-30 ENCOUNTER — Other Ambulatory Visit: Payer: Self-pay | Admitting: Obstetrics & Gynecology

## 2012-07-30 ENCOUNTER — Other Ambulatory Visit (HOSPITAL_COMMUNITY)
Admission: RE | Admit: 2012-07-30 | Discharge: 2012-07-30 | Disposition: A | Discharge status: Home / Self Care | Payer: Medicaid Other | Source: Ambulatory Visit | Attending: Obstetrics & Gynecology | Admitting: Obstetrics & Gynecology

## 2012-07-30 DIAGNOSIS — Z01419 Encounter for gynecological examination (general) (routine) without abnormal findings: Secondary | ICD-10-CM | POA: Insufficient documentation

## 2012-07-30 DIAGNOSIS — Z113 Encounter for screening for infections with a predominantly sexual mode of transmission: Secondary | ICD-10-CM | POA: Insufficient documentation

## 2013-04-15 ENCOUNTER — Encounter: Payer: Self-pay | Admitting: Nurse Practitioner

## 2013-05-18 ENCOUNTER — Telehealth: Payer: Self-pay | Admitting: Family Medicine

## 2013-05-18 MED ORDER — CITALOPRAM HYDROBROMIDE 20 MG PO TABS: 20.0000 mg | ORAL_TABLET | Freq: Every day | ORAL | Status: DC

## 2013-05-18 NOTE — Telephone Encounter (Signed)
celexa 20 mg  30 with one refill faxed to [facility]. Pt notified on her voicemail

## 2013-05-18 NOTE — Telephone Encounter (Signed)
Patient needs a refill of her Celexa to [facility] in [place]. She misplaced her pack so she hasnt had it in 3 days and she is panicking.

## 2013-05-28 ENCOUNTER — Telehealth: Payer: Self-pay | Admitting: Adult Health

## 2013-05-28 MED ORDER — METRONIDAZOLE 500 MG PO TABS: 500.0000 mg | ORAL_TABLET | Freq: Two times a day (BID) | ORAL | Status: DC

## 2013-05-28 NOTE — Telephone Encounter (Signed)
Left message that flagyl called in to [facility]

## 2013-07-24 ENCOUNTER — Encounter: Payer: Self-pay | Admitting: *Deleted

## 2013-07-26 ENCOUNTER — Telehealth: Payer: Self-pay | Admitting: Nurse Practitioner

## 2013-07-26 ENCOUNTER — Encounter: Payer: Self-pay | Admitting: Nurse Practitioner

## 2013-07-26 NOTE — Telephone Encounter (Signed)
Try generic Ritalin, comes in 3 different forms; this and generic Adderall are probably your least expensive choices

## 2013-07-26 NOTE — Telephone Encounter (Signed)
Pt calling wanting to know what ADD meds you would possibly put her on so she can go to the pharmacy an get a quote on the med to see if she can afford to try this route. Thanks

## 2013-07-26 NOTE — Telephone Encounter (Signed)
Pt states she does not have insurance and adderall is still over $100. Is there anything else cheaper

## 2013-07-26 NOTE — Telephone Encounter (Signed)
Most likely Adderall or Adderall XR (both are generic)

## 2013-07-27 NOTE — Telephone Encounter (Signed)
Left message to return call 

## 2013-07-27 NOTE — Telephone Encounter (Signed)
Discussed with patient

## 2013-08-31 ENCOUNTER — Other Ambulatory Visit: Payer: Self-pay | Admitting: Obstetrics & Gynecology

## 2013-09-17 ENCOUNTER — Other Ambulatory Visit (HOSPITAL_COMMUNITY)
Admission: RE | Admit: 2013-09-17 | Discharge: 2013-09-17 | Disposition: A | Discharge status: Home / Self Care | Payer: Medicaid Other | Source: Ambulatory Visit | Attending: Obstetrics & Gynecology | Admitting: Obstetrics & Gynecology

## 2013-09-17 ENCOUNTER — Encounter: Payer: Self-pay | Admitting: Obstetrics & Gynecology

## 2013-09-17 ENCOUNTER — Ambulatory Visit (INDEPENDENT_AMBULATORY_CARE_PROVIDER_SITE_OTHER): Payer: Medicaid Other | Admitting: Obstetrics & Gynecology

## 2013-09-17 VITALS — BP 110/78 | Ht 64.0 in | Wt 166.0 lb

## 2013-09-17 DIAGNOSIS — Z3049 Encounter for surveillance of other contraceptives: Secondary | ICD-10-CM

## 2013-09-17 DIAGNOSIS — Z01419 Encounter for gynecological examination (general) (routine) without abnormal findings: Secondary | ICD-10-CM | POA: Insufficient documentation

## 2013-09-17 DIAGNOSIS — Z113 Encounter for screening for infections with a predominantly sexual mode of transmission: Secondary | ICD-10-CM | POA: Insufficient documentation

## 2013-09-17 LAB — HIV ANTIBODY (ROUTINE TESTING W REFLEX): HIV: NONREACTIVE

## 2013-09-17 LAB — RPR

## 2013-09-17 LAB — POCT HEMOGLOBIN: Hemoglobin: 13.5 g/dL (ref 12.2–16.2)

## 2013-09-17 NOTE — Progress Notes (Signed)
Patient ID: Marissa Burnett, female   DOB: 13-Oct-1986, 27 y.o.   MRN: [id number] Subjective:     Marissa Burnett is a 27 y.o. female here for a routine exam.  Patient's last menstrual period was 09/08/2013. No obstetric history on file. Current complaints: none.  Personal health questionnaire reviewed: no.   Gynecologic History Patient's last menstrual period was 09/08/2013. Contraception: condoms Last Pap: 2013. Results were: normal Last mammogram: na. Results were: na  Obstetric History OB History  No data available     The following portions of the patient's history were reviewed and updated as appropriate: allergies, current medications, past family history, past medical history, past social history, past surgical history and problem list.  Review of Systems  Review of Systems  Constitutional: Negative for fever, chills, weight loss, malaise/fatigue and diaphoresis.  HENT: Negative for hearing loss, ear pain, nosebleeds, congestion, sore throat, neck pain, tinnitus and ear discharge.   Eyes: Negative for blurred vision, double vision, photophobia, pain, discharge and redness.  Respiratory: Negative for cough, hemoptysis, sputum production, shortness of breath, wheezing and stridor.   Cardiovascular: Negative for chest pain, palpitations, orthopnea, claudication, leg swelling and PND.  Gastrointestinal: negative for abdominal pain. Negative for heartburn, nausea, vomiting, diarrhea, constipation, blood in stool and melena.  Genitourinary: Negative for dysuria, urgency, frequency, hematuria and flank pain.  Musculoskeletal: Negative for myalgias, back pain, joint pain and falls.  Skin: Negative for itching and rash.  Neurological: Negative for dizziness, tingling, tremors, sensory change, speech change, focal weakness, seizures, loss of consciousness, weakness and headaches.  Endo/Heme/Allergies: Negative for environmental allergies and polydipsia. Does not bruise/bleed easily.   Psychiatric/Behavioral: Negative for depression, suicidal ideas, hallucinations, memory loss and substance abuse. The patient is not nervous/anxious and does not have insomnia.        Objective:    Physical Exam  Vitals reviewed. Constitutional: She is oriented to person, place, and time. She appears well-developed and well-nourished.  HENT:  Head: Normocephalic and atraumatic.        Right Ear: External ear normal.  Left Ear: External ear normal.  Nose: Nose normal.  Mouth/Throat: Oropharynx is clear and moist.  Eyes: Conjunctivae and EOM are normal. Pupils are equal, round, and reactive to light. Right eye exhibits no discharge. Left eye exhibits no discharge. No scleral icterus.  Neck: Normal range of motion. Neck supple. No tracheal deviation present. No thyromegaly present.  Cardiovascular: Normal rate, regular rhythm, normal heart sounds and intact distal pulses.  Exam reveals no gallop and no friction rub.   No murmur heard. Respiratory: Effort normal and breath sounds normal. No respiratory distress. She has no wheezes. She has no rales. She exhibits no tenderness.  GI: Soft. Bowel sounds are normal. She exhibits no distension and no mass. There is no tenderness. There is no rebound and no guarding.  Genitourinary:       Vulva is normal without lesions Vagina is pink moist without discharge Cervix normal in appearance and pap is done Uterus is normal size shape and contour Adnexa is negative with normal sized ovaries   Musculoskeletal: Normal range of motion. She exhibits no edema and no tenderness.  Neurological: She is alert and oriented to person, place, and time. She has normal reflexes. She displays normal reflexes. No cranial nerve deficit. She exhibits normal muscle tone. Coordination normal.  Skin: Skin is warm and dry. No rash noted. No erythema. No pallor.  Psychiatric: She has a normal mood and affect. Her behavior is  normal. Judgment and thought content normal.        Assessment:    Healthy female exam.    Plan:    Contraception: will decide. Follow up in: 1 year.

## 2013-09-17 NOTE — Addendum Note (Signed)
Addended by: Lazaro Arms on: 09/17/2013 10:49 AM   Modules accepted: Orders

## 2013-09-20 LAB — HSV 2 ANTIBODY, IGG: HSV 2 Glycoprotein G Ab, IgG: <0.1 IV

## 2013-10-08 ENCOUNTER — Encounter: Payer: Self-pay | Admitting: Nurse Practitioner

## 2013-10-08 ENCOUNTER — Ambulatory Visit (INDEPENDENT_AMBULATORY_CARE_PROVIDER_SITE_OTHER): Payer: Medicaid Other | Admitting: Nurse Practitioner

## 2013-10-08 ENCOUNTER — Encounter: Payer: Self-pay | Admitting: Family Medicine

## 2013-10-08 VITALS — BP 112/80 | Temp 97.9°F | Ht 64.0 in

## 2013-10-08 DIAGNOSIS — J069 Acute upper respiratory infection, unspecified: Secondary | ICD-10-CM

## 2013-10-08 MED ORDER — AMOXICILLIN 500 MG PO CAPS: 500.0000 mg | ORAL_CAPSULE | Freq: Three times a day (TID) | ORAL | Status: DC

## 2013-10-08 NOTE — Progress Notes (Signed)
Subjective:  Presents with complaints of upper respiratory congestion for the past 3-1/2 weeks. No fever. Occasional frontal area headache. Frequent cough worse at nighttime and in the mornings. Now producing green mucus for the past 2 days. Sore throat. Slight ear pain. Slight nausea, no vomiting. No diarrhea or abdominal pain. No wheezing.  Objective:   BP 112/80  Temp(Src) 97.9 F (36.6 C) (Oral)  Ht 5\' 4"  (1.626 m)  LMP 09/08/2013 NAD. Alert, oriented. Significant clear effusion bilateral, no erythema. Pharynx mildly erythematous with thick green PND noted. Neck supple with mild soft nontender adenopathy. Lungs clear. Heart regular rate rhythm.  Assessment:Acute upper respiratory infections of unspecified site  Plan: Meds ordered this encounter  Medications  . amoxicillin (AMOXIL) 500 MG capsule    Sig: Take 1 capsule (500 mg total) by mouth 3 (three) times daily.    Dispense:  30 capsule    Refill:  0    Order Specific Question:  Supervising Provider    Answer:  Merlyn Albert [[id number]]   OTC meds as directed for congestion. Call back if symptoms worsen or persist.

## 2013-11-24 ENCOUNTER — Telehealth: Payer: Self-pay | Admitting: Nurse Practitioner

## 2013-11-24 NOTE — Telephone Encounter (Signed)
Patient states she seen [place] on 10/08/2013 and was given an Antibiotic and was told if symptoms did not clear up to let her know and she will prescribe something different for these symptoms.  [facility] in [place]

## 2013-11-24 NOTE — Telephone Encounter (Signed)
Nurses please call to clarify. That was 6 weeks ago. What symptoms is she having? How long?

## 2013-11-26 MED ORDER — CEFPROZIL 500 MG PO TABS: 500.0000 mg | ORAL_TABLET | Freq: Two times a day (BID) | ORAL | Status: DC

## 2013-11-26 NOTE — Telephone Encounter (Addendum)
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 Wants med sent to [facility].

## 2013-11-26 NOTE — Telephone Encounter (Signed)
cefzil 500 bid ten d 

## 2013-11-26 NOTE — Telephone Encounter (Signed)
Rx sent electronically to [facility](patient request). Patient notified.

## 2013-12-02 ENCOUNTER — Telehealth: Payer: Self-pay | Admitting: *Deleted

## 2013-12-02 MED ORDER — AMOXICILLIN 500 MG PO CAPS: 500.0000 mg | ORAL_CAPSULE | Freq: Three times a day (TID) | ORAL | Status: DC

## 2013-12-02 NOTE — Telephone Encounter (Signed)
Pt was prescribed a antibiotic recently she do not have insurance and did not pick up rx from pharmacy she wants to know if she can get something that is cheaper preferably (Amoxicillin)

## 2013-12-02 NOTE — Telephone Encounter (Signed)
Prescribed cefzil on 11/26/13.

## 2013-12-02 NOTE — Telephone Encounter (Signed)
Amoxil 500,1 tid 10 days 

## 2013-12-02 NOTE — Telephone Encounter (Signed)
Left message on voicemail notifying patient that medication was sent in to [facility].

## 2014-01-29 ENCOUNTER — Other Ambulatory Visit: Payer: Self-pay | Admitting: Family Medicine

## 2014-04-07 ENCOUNTER — Ambulatory Visit (INDEPENDENT_AMBULATORY_CARE_PROVIDER_SITE_OTHER): Payer: 59 | Admitting: Family Medicine

## 2014-04-07 ENCOUNTER — Encounter: Payer: Self-pay | Admitting: Family Medicine

## 2014-04-07 VITALS — BP 118/74 | Ht 64.0 in | Wt 165.0 lb

## 2014-04-07 DIAGNOSIS — J309 Allergic rhinitis, unspecified: Secondary | ICD-10-CM

## 2014-04-07 DIAGNOSIS — J019 Acute sinusitis, unspecified: Secondary | ICD-10-CM

## 2014-04-07 MED ORDER — AMOXICILLIN 500 MG PO CAPS: 500.0000 mg | ORAL_CAPSULE | Freq: Three times a day (TID) | ORAL | Status: DC

## 2014-04-07 NOTE — Progress Notes (Signed)
   Subjective:    Patient ID: Marissa Burnett, female    DOB: 05-Jul-1986, 28 y.o.   MRN: [id number]  Cough This is a new problem. The current episode started yesterday. The problem occurs every few minutes. The cough is productive of sputum. Associated symptoms include ear pain, nasal congestion, postnasal drip and rhinorrhea. Associated symptoms comments: Metal taste in her mouth, and hot flashes . She has tried OTC cough suppressant for the symptoms. The treatment provided no relief.   PMH benign   Review of Systems  HENT: Positive for ear pain, postnasal drip and rhinorrhea.   Respiratory: Positive for cough.        Objective:   Physical Exam  Lungs are clear hearts regular subjective discomfort in the sinuses eardrums normal neck no masses      Assessment & Plan:  Moderate sinus symptoms along with possible sinusitis I recommend allergy tablet also went ahead and prescribed her amoxicillin 10 days she will get that filled in a few days if not improving.

## 2014-07-20 ENCOUNTER — Ambulatory Visit (INDEPENDENT_AMBULATORY_CARE_PROVIDER_SITE_OTHER): Payer: 59 | Admitting: Nurse Practitioner

## 2014-07-20 ENCOUNTER — Encounter: Payer: Self-pay | Admitting: Nurse Practitioner

## 2014-07-20 VITALS — BP 118/78 | Resp 18 | Ht 64.0 in

## 2014-07-20 DIAGNOSIS — Z79899 Other long term (current) drug therapy: Secondary | ICD-10-CM

## 2014-07-20 DIAGNOSIS — F988 Other specified behavioral and emotional disorders with onset usually occurring in childhood and adolescence: Secondary | ICD-10-CM | POA: Insufficient documentation

## 2014-07-20 MED ORDER — AMPHETAMINE-DEXTROAMPHETAMINE 10 MG PO TABS: ORAL_TABLET | ORAL | Status: DC

## 2014-07-21 ENCOUNTER — Encounter: Payer: Self-pay | Admitting: Nurse Practitioner

## 2014-07-21 ENCOUNTER — Ambulatory Visit: Payer: 59 | Admitting: Nurse Practitioner

## 2014-07-21 NOTE — Progress Notes (Signed)
Subjective:  Presents for recheck. Celexa doing well for her anxiety but continues to have significant difficulty focusing and completing tasks. Patient works and is going to school. Has a hard time completing tasks at school. Very impulsive at times. [facility] her daughter has ADD. No personal history of cardiac problems.  Objective:   BP 118/78  Resp 18  Ht 5\' 4"  (1.626 m)  LMP 07/19/2014 NAD. Alert, oriented. Lungs clear. Heart regular rate rhythm. ECG normal.  Assessment:  Problem List Items Addressed This Visit     Other   ADD (attention deficit disorder) - Primary    Other Visit Diagnoses   High risk medication use        Relevant Orders       PR ELECTROCARDIOGRAM, COMPLETE      Plan:  Meds ordered this encounter  Medications  . amphetamine-dextroamphetamine (ADDERALL) 10 MG tablet    Sig: One po TID 4 hours apart    Dispense:  90 tablet    Refill:  0    Order Specific Question:  Supervising Provider    Answer:  Mikey Kirschner [[id number]]   Cautioned about potential adverse effects. DC med and call if any problems. Return in about 1 month (around 08/20/2014).

## 2014-07-29 ENCOUNTER — Encounter: Payer: Self-pay | Admitting: Family Medicine

## 2014-08-03 ENCOUNTER — Other Ambulatory Visit: Payer: Self-pay | Admitting: Family Medicine

## 2014-09-22 ENCOUNTER — Ambulatory Visit: Payer: Self-pay | Admitting: Family Medicine

## 2014-09-28 ENCOUNTER — Ambulatory Visit: Payer: Self-pay | Admitting: Family Medicine

## 2014-10-06 ENCOUNTER — Other Ambulatory Visit (HOSPITAL_COMMUNITY)
Admission: RE | Admit: 2014-10-06 | Discharge: 2014-10-06 | Disposition: A | Discharge status: Home / Self Care | Payer: 59 | Source: Ambulatory Visit | Attending: Obstetrics & Gynecology | Admitting: Obstetrics & Gynecology

## 2014-10-06 ENCOUNTER — Encounter: Payer: Self-pay | Admitting: Obstetrics & Gynecology

## 2014-10-06 ENCOUNTER — Ambulatory Visit (INDEPENDENT_AMBULATORY_CARE_PROVIDER_SITE_OTHER): Payer: 59 | Admitting: Obstetrics & Gynecology

## 2014-10-06 VITALS — BP 110/80 | Ht 64.0 in | Wt 177.0 lb

## 2014-10-06 DIAGNOSIS — Z01419 Encounter for gynecological examination (general) (routine) without abnormal findings: Secondary | ICD-10-CM | POA: Diagnosis present

## 2014-10-06 NOTE — Addendum Note (Signed)
Addended by: Farley Ly on: 10/06/2014 10:00 AM   Modules accepted: Orders

## 2014-10-06 NOTE — Progress Notes (Signed)
Patient ID: Marissa Burnett, female   DOB: 08/26/1986, 28 y.o.   MRN: [id number] Subjective:     Marissa Burnett is a 28 y.o. female here for a routine exam.  Patient's last menstrual period was 09/13/2014. No obstetric history on file. Birth Control Method:  condoms Menstrual Calendar(currently): regular  Current complaints: none.   Current acute medical issues:  none   Recent Gynecologic History Patient's last menstrual period was 09/13/2014. Last Pap: 2014,  normal Last mammogram: ,    Past Medical History  Diagnosis Date  . Heart murmur birth  . Allergy   . Anxiety     Past Surgical History  Procedure Laterality Date  . Cholecystectomy  02/2002  . Other surgical history      stent in kidney during pregnancy,2002; removed in 2003    OB History   Grav Para Term Preterm Abortions TAB SAB Ect Mult Living                  History   Social History  . Marital Status: Single    Spouse Name: N/A    Number of Children: N/A  . Years of Education: N/A   Social History Main Topics  . Smoking status: Never Smoker   . Smokeless tobacco: Never Used  . Alcohol Use: Yes     Comment: social  . Drug Use: No  . Sexual Activity: Yes    Birth Control/ Protection: Condom   Other Topics Concern  . None   Social History Narrative  . None    Family History  Problem Relation Age of Onset  . Diabetes Other   . Cancer Paternal Grandfather     lung    Current outpatient prescriptions:citalopram (CELEXA) 20 MG tablet, TAKE ONE-HALF TABLET BY MOUTH EVERY DAY AT BEDTIME FOR 6 DAYS, THEN TAKE ONE EVERY DAY AT BEDTIME THEREAFTER, Disp: 30 tablet, Rfl: 0;  amoxicillin (AMOXIL) 500 MG capsule, Take 1 capsule (500 mg total) by mouth 3 (three) times daily., Disp: 30 capsule, Rfl: 0;  amphetamine-dextroamphetamine (ADDERALL) 10 MG tablet, One po TID 4 hours apart, Disp: 90 tablet, Rfl: 0  Review of Systems  Review of Systems  Constitutional: Negative for fever, chills, weight  loss, malaise/fatigue and diaphoresis.  HENT: Negative for hearing loss, ear pain, nosebleeds, congestion, sore throat, neck pain, tinnitus and ear discharge.   Eyes: Negative for blurred vision, double vision, photophobia, pain, discharge and redness.  Respiratory: Negative for cough, hemoptysis, sputum production, shortness of breath, wheezing and stridor.   Cardiovascular: Negative for chest pain, palpitations, orthopnea, claudication, leg swelling and PND.  Gastrointestinal: negative for abdominal pain. Negative for heartburn, nausea, vomiting, diarrhea, constipation, blood in stool and melena.  Genitourinary: Negative for dysuria, urgency, frequency, hematuria and flank pain.  Musculoskeletal: Negative for myalgias, back pain, joint pain and falls.  Skin: Negative for itching and rash.  Neurological: Negative for dizziness, tingling, tremors, sensory change, speech change, focal weakness, seizures, loss of consciousness, weakness and headaches.  Endo/Heme/Allergies: Negative for environmental allergies and polydipsia. Does not bruise/bleed easily.  Psychiatric/Behavioral: Negative for depression, suicidal ideas, hallucinations, memory loss and substance abuse. The patient is not nervous/anxious and does not have insomnia.        Objective:  Blood pressure 110/80, height 5\' 4"  (1.626 m), weight 177 lb (80.287 kg), last menstrual period 09/13/2014.   Physical Exam  Vitals reviewed. Constitutional: She is oriented to person, place, and time. She appears well-developed and well-nourished.  HENT:  Head:  Normocephalic and atraumatic.        Right Ear: External ear normal.  Left Ear: External ear normal.  Nose: Nose normal.  Mouth/Throat: Oropharynx is clear and moist.  Eyes: Conjunctivae and EOM are normal. Pupils are equal, round, and reactive to light. Right eye exhibits no discharge. Left eye exhibits no discharge. No scleral icterus.  Neck: Normal range of motion. Neck supple. No  tracheal deviation present. No thyromegaly present.  Cardiovascular: Normal rate, regular rhythm, normal heart sounds and intact distal pulses.  Exam reveals no gallop and no friction rub.   No murmur heard. Respiratory: Effort normal and breath sounds normal. No respiratory distress. She has no wheezes. She has no rales. She exhibits no tenderness.  GI: Soft. Bowel sounds are normal. She exhibits no distension and no mass. There is no tenderness. There is no rebound and no guarding.  Genitourinary:  Breasts no masses skin changes or nipple changes bilaterally      Vulva is normal without lesions Vagina is pink moist without discharge Cervix normal in appearance and pap is done Uterus is normal size shape and contour Adnexa is negative with normal sized ovaries    Musculoskeletal: Normal range of motion. She exhibits no edema and no tenderness.  Neurological: She is alert and oriented to person, place, and time. She has normal reflexes. She displays normal reflexes. No cranial nerve deficit. She exhibits normal muscle tone. Coordination normal.  Skin: Skin is warm and dry. No rash noted. No erythema. No pallor.  Psychiatric: She has a normal mood and affect. Her behavior is normal. Judgment and thought content normal.       Assessment:    Healthy female exam.    Plan:    Contraception: condoms. Follow up in: 1 year.

## 2014-10-10 LAB — CYTOLOGY - PAP

## 2014-11-24 ENCOUNTER — Other Ambulatory Visit: Payer: Self-pay | Admitting: Family Medicine

## 2015-01-24 ENCOUNTER — Ambulatory Visit: Payer: Self-pay | Admitting: Family Medicine

## 2015-01-25 ENCOUNTER — Ambulatory Visit: Payer: Self-pay | Admitting: Family Medicine

## 2015-04-12 ENCOUNTER — Ambulatory Visit (INDEPENDENT_AMBULATORY_CARE_PROVIDER_SITE_OTHER): Payer: Self-pay | Admitting: Family Medicine

## 2015-04-12 ENCOUNTER — Encounter: Payer: Self-pay | Admitting: Family Medicine

## 2015-04-12 VITALS — Temp 98.4°F | Ht 64.0 in

## 2015-04-12 DIAGNOSIS — H6501 Acute serous otitis media, right ear: Secondary | ICD-10-CM

## 2015-04-12 MED ORDER — CLONAZEPAM 1 MG PO TABS: ORAL_TABLET | ORAL | Status: DC

## 2015-04-12 MED ORDER — AMOXICILLIN 500 MG PO CAPS: 500.0000 mg | ORAL_CAPSULE | Freq: Three times a day (TID) | ORAL | Status: DC

## 2015-04-12 NOTE — Progress Notes (Signed)
   Subjective:    Patient ID: Marissa Burnett, female    DOB: 1986/03/31, 29 y.o.   MRN: [id number]  HPI Right ear pain. Started about 2 days ago. Tx Tylenol.   Cough and congestion.   Very painful, took four ibu. One oclock called here  Pain pretty bad,    Pt notes muffled sens  And pain and discomfort'' Patient also notes extreme high anxiety when taking a test. Has been given benzodiazepines for this in the past and it definitely helped Review of Systems No vomiting no diarrhea no chest pain no abdominal pain    Objective:   Physical Exam Alert no apparent distress. HEENT positive for otitis media. Frontal maxillary tenderness.       Assessment & Plan:  Impression right otitis media with plus minus sinusitis #2 performance anxiety with pending important test plan antibiotics prescribed. Klonopin prescribed. Proper use discussed WSL

## 2015-04-28 ENCOUNTER — Ambulatory Visit: Payer: Self-pay | Admitting: Nurse Practitioner

## 2015-06-30 ENCOUNTER — Ambulatory Visit (INDEPENDENT_AMBULATORY_CARE_PROVIDER_SITE_OTHER): Payer: 59 | Admitting: Nurse Practitioner

## 2015-06-30 VITALS — BP 116/76 | Wt 178.0 lb

## 2015-06-30 DIAGNOSIS — F419 Anxiety disorder, unspecified: Secondary | ICD-10-CM | POA: Diagnosis not present

## 2015-06-30 MED ORDER — PHENTERMINE HCL 37.5 MG PO TABS: 37.5000 mg | ORAL_TABLET | Freq: Every day | ORAL | Status: DC

## 2015-07-04 ENCOUNTER — Encounter: Payer: Self-pay | Admitting: Nurse Practitioner

## 2015-07-04 NOTE — Progress Notes (Signed)
Subjective:  Presents for recheck on her anxiety. Weaned herself off Celexa. Has been off for-5 days. Minimal increase in anxiety between fine off medication. Would like to try medication to help with her weight loss. Has been taking phentermine off and on for about 10 years. Denies any adverse effects.  Objective:   BP 116/76 mmHg  Wt 178 lb (80.74 kg) NAD. Alert, oriented. Lungs clear. Heart regular rate rhythm. BMI 30.5.  Assessment:  Problem List Items Addressed This Visit      Other   Anxiety - Primary   Morbid obesity   Relevant Medications   phentermine (ADIPEX-P) 37.5 MG tablet      Plan: Meds ordered this encounter  Medications  . phentermine (ADIPEX-P) 37.5 MG tablet    Sig: Take 1 tablet (37.5 mg total) by mouth daily before breakfast.    Dispense:  30 tablet    Refill:  2    Order Specific Question:  Supervising Provider    Answer:  Maggie Font   Defers other medication for anxiety at this time. Restart phentermine as directed. Encouraged healthy diet and regular activity. Recheck in 3 months if she wishes to continue phentermine. Also given information on Contrave.

## 2015-07-26 ENCOUNTER — Telehealth: Payer: Self-pay | Admitting: Family Medicine

## 2015-07-26 NOTE — Telephone Encounter (Signed)
ERROR

## 2015-08-31 ENCOUNTER — Encounter: Payer: Self-pay | Admitting: Nurse Practitioner

## 2015-08-31 ENCOUNTER — Ambulatory Visit (INDEPENDENT_AMBULATORY_CARE_PROVIDER_SITE_OTHER): Payer: 59 | Admitting: Nurse Practitioner

## 2015-08-31 VITALS — BP 100/78 | Temp 98.4°F | Ht 64.0 in

## 2015-08-31 DIAGNOSIS — J0110 Acute frontal sinusitis, unspecified: Secondary | ICD-10-CM

## 2015-08-31 DIAGNOSIS — Z91010 Allergy to peanuts: Secondary | ICD-10-CM

## 2015-08-31 MED ORDER — PREDNISONE 20 MG PO TABS: ORAL_TABLET | ORAL | Status: DC

## 2015-08-31 MED ORDER — METHYLPREDNISOLONE ACETATE 40 MG/ML IJ SUSP
40.0000 mg | Freq: Once | INTRAMUSCULAR | Status: AC
  Administered 2015-08-31: 40 mg via INTRAMUSCULAR

## 2015-08-31 MED ORDER — AZITHROMYCIN 250 MG PO TABS: ORAL_TABLET | ORAL | Status: DC

## 2015-08-31 NOTE — Patient Instructions (Signed)
nasacort AQ or Rhinocort as directed

## 2015-09-02 ENCOUNTER — Encounter: Payer: Self-pay | Admitting: Nurse Practitioner

## 2015-09-02 NOTE — Progress Notes (Signed)
Subjective:  Presents for complaints of sinus symptoms over the past 2 days. No fever. Sore throat worse at morning and nighttime. Frontal area headache. Head congestion. Occasional cough producing green sputum. No wheezing. Also patient requesting referral to allergy specialist for possible allergy to peanut butter. Has mild swelling. No hives. No symptoms of anaphylaxis.  Objective:   BP 100/78 mmHg  Temp(Src) 98.4 F (36.9 C) (Oral)  Ht 5\' 4"  (1.626 m)  Wt  NAD. Alert, oriented. TMs clear effusion, no erythema. Pharynx mildly injected with green PND noted. Neck supple with mild soft anterior adenopathy. Lungs clear. Heart regular rate rhythm.  Assessment: Acute frontal sinusitis, recurrence not specified - Plan: methylPREDNISolone acetate (DEPO-MEDROL) injection 40 mg  Allergy to peanuts - Plan: methylPREDNISolone acetate (DEPO-MEDROL) injection 40 mg, Ambulatory referral to Allergy   Plan:  Meds ordered this encounter  Medications  . azithromycin (ZITHROMAX Z-PAK) 250 MG tablet    Sig: Take 2 tablets (500 mg) on  Day 1,  followed by 1 tablet (250 mg) once daily on Days 2 through 5.    Dispense:  6 each    Refill:  0    Order Specific Question:  Supervising Provider    Answer:  Mikey Kirschner [[id number]]  . predniSONE (DELTASONE) 20 MG tablet    Sig: 3 po qd x 3 d then 2 po qd x 3 d then 1 po qd x 3 d    Dispense:  18 tablet    Refill:  0    Order Specific Question:  Supervising Provider    Answer:  Mikey Kirschner [[id number]]  . methylPREDNISolone acetate (DEPO-MEDROL) injection 40 mg    Sig:    Given prescription for prednisone to start over the holiday weekend if no improvement in symptoms. OTC meds as directed for congestion and cough. Callback in 7-10 days if no improvement, sooner if worse.

## 2015-09-11 ENCOUNTER — Encounter: Payer: Self-pay | Admitting: Family Medicine

## 2015-10-09 ENCOUNTER — Other Ambulatory Visit: Payer: 59 | Admitting: Women's Health

## 2015-10-11 ENCOUNTER — Ambulatory Visit (INDEPENDENT_AMBULATORY_CARE_PROVIDER_SITE_OTHER): Payer: 59 | Admitting: Women's Health

## 2015-10-11 ENCOUNTER — Encounter: Payer: Self-pay | Admitting: Women's Health

## 2015-10-11 VITALS — BP 122/78 | HR 84 | Ht 64.25 in | Wt 181.0 lb

## 2015-10-11 DIAGNOSIS — Z01419 Encounter for gynecological examination (general) (routine) without abnormal findings: Secondary | ICD-10-CM | POA: Diagnosis not present

## 2015-10-11 MED ORDER — ETONOGESTREL-ETHINYL ESTRADIOL 0.12-0.015 MG/24HR VA RING: VAGINAL_RING | VAGINAL | Status: DC

## 2015-10-11 NOTE — Progress Notes (Signed)
Patient ID: CIONNA COLLANTES, female   DOB: [birth year], 29 y.o.   MRN: [id number] Subjective:   DESHONDA CRYDERMAN is a 29 y.o. G14P2002 Caucasian female here for a routine well-woman exam.  Patient's last menstrual period was 09/29/2015 (approximate).    Current complaints: 58yo husband of 3years wants to have a baby- he doesn't have any of his own- was married to someone else x 3 years and was never able to conceive- had sperm count done x 2, 1st showed no sperm, then some sperm w/ another doctor. Pt has 2 children of her own, conceived both on [facility]. Had IUD removed 3 years ago and periods have been different since- has at same time every month, but only last 1-1.5days instead of 4-5 days they used to last prior to IUD. No cramping during periods. 1st day may be slightly heavy then bleeding is usually gone by next day. Wants something to make them more normal to try to conceive. Was on LoLoestrin in past- had a raging appetite, gained 30lbs. Did ok w/ nuva ring, although she conceived on it.  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  Past Medical History Past Medical History  Diagnosis Date  . Heart murmur birth  . Allergy   . Anxiety     Past Surgical History Past Surgical History  Procedure Laterality Date  . Cholecystectomy  02/2002  . Other surgical history      stent in kidney during pregnancy,2002; removed in 2003    Gynecologic History No obstetric history on file.  Patient's last menstrual  period was 09/29/2015 (approximate). Contraception: none Last Pap: 2015. Results were: normal Last mammogram: never. Results were: n/a Last TCS: never  Obstetric History OB History  No data available    Current Medications Current Outpatient Prescriptions on File Prior to Visit  Medication Sig Dispense Refill  . azithromycin (ZITHROMAX Z-PAK) 250 MG tablet Take 2 tablets (500 mg) on  Day 1,  followed by 1 tablet (250 mg) once daily on Days 2 through 5. (Patient not taking: Reported on 10/11/2015) 6 each 0  . phentermine (ADIPEX-P) 37.5 MG tablet Take 1 tablet (37.5 mg total) by mouth daily before breakfast. (Patient not taking: Reported on 10/11/2015) 30 tablet 2  . predniSONE (DELTASONE) 20 MG tablet 3 po qd x 3 d then 2 po qd x 3 d then 1 po qd x 3 d (Patient not taking: Reported on 10/11/2015) 18 tablet 0   No current facility-administered medications on file prior to visit.    Review of Systems Patient denies any headaches, blurred vision, shortness of breath, chest pain, abdominal pain, problems with bowel movements, urination, or intercourse.  Objective:  BP 122/78 mmHg  Pulse 84  Ht 5' 4.25" (1.632 m)  Wt 181 lb (82.101 kg)  BMI 30.83 kg/m2  LMP 09/29/2015 (Approximate) Physical Exam  General:  Well developed, well nourished, no acute distress. She is alert and oriented x3. Skin:  Warm and dry Neck:  Midline trachea, no thyromegaly or nodules Cardiovascular: Regular rate and rhythm, no  murmur heard Lungs:  Effort normal, all lung fields clear to auscultation bilaterally Breasts:  No dominant palpable mass, retraction, or nipple discharge Abdomen:  Soft, non tender, no hepatosplenomegaly or masses Pelvic:  External genitalia is normal in appearance.  The vagina is normal in appearance. The cervix is bulbous, no CMT.  Thin prep pap is not done. Uterus is felt to be normal size, shape, and contour.  No adnexal masses or tenderness noted. Extremities:  No swelling or  varicosities noted Psych:  She has a normal mood and affect  Assessment:   Healthy well-woman exam Desires pregnancy  Plan:  CBC, CMP, TSH today Rx nuva ring x 3 months to regulate periods Gave printed info on fertility tips Have husband see urologist for infertility work-up Start pnv in case conceives F/U 19mths for [facility] f/u, or sooner if needed Mammogram @30yo  or sooner if problems Colonoscopy @30yo  or sooner if problems  Tawnya Crook CNM, [facility] 10/11/2015 10:51 AM

## 2015-10-11 NOTE — Patient Instructions (Addendum)
Nuva ring for 3 months to try to help regulate your period  If you are trying to get pregnant:   Have sex every other day on days 7-24 of your cycle (Day 1 is the 1st day of your period)  [place] before sex  Lay with your hips elevated on pillows for 20-35mins after sex  Do not smoke or drink alcohol  Lose weight if you are overweight  Take a prenatal vitamin with at least 409BDZ of folic acid  Decrease stress in your life  For Him:   Wear boxers instead of briefs  Avoid hot baths/jacuzzi  Vit C supplement  Do not smoke or drink alcohol  Lose weight if you are overweight  [email or web address]

## 2015-10-12 LAB — COMPREHENSIVE METABOLIC PANEL
ALBUMIN: 4.2 g/dL (ref 3.5–5.5)
ALK PHOS: 80 IU/L (ref 39–117)
ALT: 7 IU/L (ref 0–32)
AST: 13 IU/L (ref 0–40)
Albumin/Globulin Ratio: 1.6 (ref 1.1–2.5)
BUN / CREAT RATIO: 17 (ref 8–20)
BUN: 10 mg/dL (ref 6–20)
Bilirubin Total: 0.3 mg/dL (ref 0.0–1.2)
CO2: 24 mmol/L (ref 18–29)
CREATININE: 0.59 mg/dL (ref 0.57–1.00)
Calcium: 8.8 mg/dL (ref 8.7–10.2)
Chloride: 101 mmol/L (ref 97–108)
GFR calc Af Amer: 142 mL/min/{1.73_m2} (ref >59)
GFR calc non Af Amer: 123 mL/min/{1.73_m2} (ref >59)
GLUCOSE: 87 mg/dL (ref 65–99)
Globulin, Total: 2.6 g/dL (ref 1.5–4.5)
Potassium: 4.1 mmol/L (ref 3.5–5.2)
Sodium: 140 mmol/L (ref 134–144)
Total Protein: 6.8 g/dL (ref 6.0–8.5)

## 2015-10-12 LAB — CBC
HEMOGLOBIN: 13.3 g/dL (ref 11.1–15.9)
Hematocrit: 40.3 % (ref 34.0–46.6)
MCH: 30.4 pg (ref 26.6–33.0)
MCHC: 33 g/dL (ref 31.5–35.7)
MCV: 92 fL (ref 79–97)
Platelets: 193 10*3/uL (ref 150–379)
RBC: 4.38 x10E6/uL (ref 3.77–5.28)
RDW: 13 % (ref 12.3–15.4)
WBC: 5.9 10*3/uL (ref 3.4–10.8)

## 2015-10-12 LAB — TSH: TSH: 0.878 u[IU]/mL (ref 0.450–4.500)

## 2015-10-13 ENCOUNTER — Telehealth: Payer: Self-pay | Admitting: Family Medicine

## 2015-10-13 ENCOUNTER — Other Ambulatory Visit: Payer: Self-pay | Admitting: Nurse Practitioner

## 2015-10-13 MED ORDER — PHENTERMINE HCL 37.5 MG PO TABS: 37.5000 mg | ORAL_TABLET | Freq: Every day | ORAL | Status: DC

## 2015-10-13 NOTE — Telephone Encounter (Signed)
Pt is wanting the [facility]term[facility]. Pt states it is cheaper there.

## 2015-10-13 NOTE — Telephone Encounter (Signed)
Carolyn to see 

## 2015-10-26 ENCOUNTER — Ambulatory Visit: Payer: 59 | Admitting: Allergy and Immunology

## 2015-11-14 ENCOUNTER — Ambulatory Visit: Payer: 59 | Admitting: Allergy and Immunology

## 2015-12-14 ENCOUNTER — Ambulatory Visit (INDEPENDENT_AMBULATORY_CARE_PROVIDER_SITE_OTHER): Payer: 59 | Admitting: Allergy and Immunology

## 2015-12-14 ENCOUNTER — Encounter: Payer: Self-pay | Admitting: Allergy and Immunology

## 2015-12-14 VITALS — BP 128/72 | HR 72 | Temp 98.0°F | Resp 18 | Ht 64.0 in | Wt 172.0 lb

## 2015-12-14 DIAGNOSIS — J309 Allergic rhinitis, unspecified: Secondary | ICD-10-CM | POA: Diagnosis not present

## 2015-12-14 DIAGNOSIS — H1010 Acute atopic conjunctivitis, unspecified eye: Secondary | ICD-10-CM | POA: Diagnosis not present

## 2015-12-14 DIAGNOSIS — Z91010 Allergy to peanuts: Secondary | ICD-10-CM

## 2015-12-14 NOTE — Patient Instructions (Signed)
Take Home Sheet  1. Avoidance: Mite and Mold and peanut.   2. Antihistamine: Claritin 10mg  by mouth once daily for runny nose or itching as needed.   3. Nasal Spray: Rhinocort AQ 1-2 spray(s) each nostril once daily for stuffy nose or drainage as needed.  4.  Epi-pen/Benadryl  as needed.   5.  Follow up Visit: 2-3 months or sooner if needed.    Consider selected labs as discussed.   Websites that have reliable Patient information: 1. American Academy of Asthma, Allergy, & Immunology: www.aaaai.org 2. Food Allergy Network: www.foodallergy.org 3. Mothers of Asthmatics: www.aanma.org 4. [place]: DiningCalendar.de 5. American College of Allergy, Asthma, & Immunology: https://robertson.info/ or www.acaai.org

## 2016-01-12 MED FILL — PHENTERMINE 37.5 MG TABLET: 37.5 | 30 days supply | Qty: 30 | Fill #2

## 2016-01-15 ENCOUNTER — Ambulatory Visit: Payer: 59 | Admitting: Women's Health

## 2016-01-17 MED ORDER — EPINEPHRINE 0.3 MG/0.3ML IJ SOAJ: 0.3000 mg | Freq: Once | INTRAMUSCULAR | Status: DC

## 2016-01-17 MED FILL — EPINEPHRINE 0.3 MG AUTO-INJ: 0.3 | 30 days supply | Qty: 2 | Fill #0

## 2016-01-17 NOTE — Progress Notes (Signed)
NEW PATIENT NOTE  RE: [name] MRN: [id number] DOB: 04-08-1986 [facility]  [address] [place] [address] Date of Office Visit: 12/14/2015  Referring provider: [name], MD [address], [place] [address]  Subjective:  Marissa Burnett is a 30 y.o. female who presents today for Allergy Testing  Assessment:   1. Peanut allergy   2. Allergic rhinoconjunctivitis    Plan:   Meds ordered this encounter  Medications  . EPINEPHrine 0.3 mg/0.3 mL IJ SOAJ injection    Sig: Inject 0.3 mLs (0.3 mg total) into the muscle once.    Dispense:  2 Device    Refill:  1   Patient Instructions  1. Avoidance: Mite and Mold and peanut. 2. Antihistamine: Claritin 10mg  by mouth once daily for runny nose or itching as needed. 3. Nasal Spray: Rhinocort AQ 1-2 spray(s) each nostril once daily for stuffy nose or drainage as needed. 4.  Epi-pen/Benadryl  as needed. 5.  Follow up Visit: 2-3 months or sooner if needed.    Consider selected labs as discussed.  HPI: Marissa Burnett presents to the office with history of rhinorrhea, congestion, sneezing, itchy watery eyes for more than 10 years, which appears to be worsened with pollen, outdoor, fluctuant weather pattern, and cigarette smoke exposures.  She describes occasional dry skin in the Winter, but no difficulty with hives, swelling, rashes or eczema.  She remembers pneumonia  22 years ago, but no recurring infections, sinus difficulties or reflux.  Denies nocturnal, exercise induced daily, respiratory symptoms.  In addition in 2014 with ingesting peanut butter crackers while at school, she had throat tightening, and then 2 weeks later similar throat irritation with a peanut butter pretzel.  There are no hives or swelling, vomiting or diarrhea, respiratory symptoms without his episodes, but has been minimizing since her peanut/nut exposures.  She inadvertently ingested a Snickers and M&Ms  recently without symptoms and therefore is interested in testing.  No other food concerns, and typically has only use Benadryl as needed.  Denies Urgent care visits, prednisone or antibiotic courses.  Medical History: Past Medical History  Diagnosis Date  . Heart murmur birth  . Allergy   . Anxiety    Surgical History: Past Surgical History  Procedure Laterality Date  . Cholecystectomy  02/2002  . Other surgical history      stent in kidney during pregnancy,2002; removed in 2003  . Gallbladder surgery     Family History: Family History  Problem Relation Age of Onset  . Diabetes Other   . Cancer Paternal Grandfather     lung  . Diabetes Maternal Grandmother   . COPD Maternal Grandfather   . Heart disease Paternal Grandmother   . Allergic rhinitis Neg Hx   . Angioedema Neg Hx   . Asthma Neg Hx   . Atopy Neg Hx   . Eczema Neg Hx   . Immunodeficiency Neg Hx   . Urticaria Neg Hx    Social History: Social History  . Marital Status: married    Spouse Name: N/A  . Number of Children: N/A  . Years of Education: N/A   Social History Main Topics  . Smoking status: Never Smoker   . Smokeless tobacco: Never Used  . Alcohol Use: Yes     Comment: social  . Drug Use: No  . 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  Medications prior to this encounter: Outpatient Prescriptions Prior to Visit  Medication Sig Dispense Refill  . etonogestrel-ethinyl estradiol (NUVARING) 0.12-0.015 MG/24HR vaginal ring Insert vaginally and leave in place for 3 consecutive weeks, then remove for 1 week. 1 each 12   No facility-administered medications prior to visit.   Drug Allergies: Allergies  Allergen Reactions  . Codeine     Nausea and vomiting   Environmental History: Marissa Burnett lives in a 31 year old house 2 years with wood floors, central air  and heat and indoor dog.  Stuffed mattress non-feather pillow and comforter.  No humidifier, or smokers.  Review of Systems  Constitutional: Negative for fever, weight loss and malaise/fatigue.  HENT: Positive for congestion. Negative for ear pain, hearing loss, nosebleeds and sore throat.   Eyes: Negative for discharge and redness.  Respiratory: Negative for shortness of breath.        Denies history of bronchitis.  Negative PPD.  Gastrointestinal: Negative for heartburn, nausea, vomiting, abdominal pain, diarrhea and constipation.  Genitourinary: Negative.   Musculoskeletal: Negative for myalgias and joint pain.  Skin: Negative.  Negative for itching and rash.  Neurological: Negative.  Negative for dizziness, seizures, weakness and headaches.  Endo/Heme/Allergies: Positive for environmental allergies.       Denies sensitivity to aspirin, NSAIDs, stinging insects (only large local swelling after bee sting), latex, jewelry and cosmetics.   Objective:   Filed Vitals:   12/14/15 1337  BP: 128/72  Pulse: 72  Temp: 98 F (36.7 C)  Resp: 18   Physical Exam  Constitutional: She is well-developed, well-nourished, and in no distress.  HENT:  Head: Atraumatic.  Right Ear: Tympanic membrane and ear canal normal.  Left Ear: Tympanic membrane and ear canal normal.  Nose: Mucosal edema present. No rhinorrhea. No epistaxis.  Mouth/Throat: Oropharynx is clear and moist and mucous membranes are normal. No oropharyngeal exudate, posterior oropharyngeal edema or posterior oropharyngeal erythema.  Eyes: Conjunctivae are normal.  Neck: Neck supple.  Cardiovascular: Normal rate, S1 normal and S2 normal.   No murmur heard. Pulmonary/Chest: Effort normal. She has no wheezes. She has no rhonchi. She has no rales.  Abdominal: Soft. Normal appearance and bowel sounds are normal.  Musculoskeletal: She exhibits no edema.  Lymphadenopathy:    She has no cervical adenopathy.  Neurological: She is alert.   Skin: Skin is warm and intact. No rash noted. No cyanosis. Nails show no clubbing.   Diagnostics: Skin testing:  Mild reactivity to selected mold species, dust mite and cockroach as well as peanut with only equivocal reactivity to pecan, walnut, hazelnut, and [place] nut.     Mila Pair M. Ishmael Holter, MD   cc: Sallee Lange, MD

## 2016-02-20 ENCOUNTER — Other Ambulatory Visit: Payer: Self-pay | Admitting: *Deleted

## 2016-02-20 MED ORDER — PHENTERMINE HCL 37.5 MG PO TABS: 37.5000 mg | ORAL_TABLET | Freq: Every day | ORAL | Status: DC

## 2016-02-21 MED FILL — PHENTERMINE 37.5 MG TABLET: 37.5 | 30 days supply | Qty: 30 | Fill #0

## 2016-02-23 ENCOUNTER — Ambulatory Visit (INDEPENDENT_AMBULATORY_CARE_PROVIDER_SITE_OTHER): Payer: 59 | Admitting: Family Medicine

## 2016-02-23 ENCOUNTER — Encounter: Payer: Self-pay | Admitting: Family Medicine

## 2016-02-23 VITALS — BP 104/72 | Temp 98.4°F | Ht 64.0 in | Wt 170.5 lb

## 2016-02-23 DIAGNOSIS — J0110 Acute frontal sinusitis, unspecified: Secondary | ICD-10-CM | POA: Diagnosis not present

## 2016-02-23 MED ORDER — AZITHROMYCIN 250 MG PO TABS: ORAL_TABLET | ORAL | Status: DC

## 2016-02-23 NOTE — Progress Notes (Signed)
   Subjective:    Patient ID: Marissa Burnett, female    DOB: 1986-08-04, 30 y.o.   MRN: [id number]  Otalgia  This is a new problem. The current episode started yesterday. The problem has been unchanged. There has been no fever. The pain is moderate. Associated symptoms include headaches and a sore throat. Associated symptoms comments: Fatigue, chills, runny nose. Treatments tried: sudafed. The treatment provided no relief.   Nose draining and pouring  Headache sore throat, neck hurting  Felt sluggish the day before  Not much cough  No achiness elsewhere  Flu shot already given    Review of Systems  HENT: Positive for ear pain and sore throat.   Neurological: Positive for headaches.       Objective:   Physical Exam  Alert active good hydration. HEENT moderate nasal congestion TMs retracted pharynx slight erythema neck supple lungs clear heart regular in rhythm.      Assessment & Plan:  Impression 1 early rhinosinusitis/probable viral component cannot rule out mild flu discussed plan antibiotics prescribed. Symptom care discussed warning signs discussed WSL

## 2016-02-27 ENCOUNTER — Ambulatory Visit (INDEPENDENT_AMBULATORY_CARE_PROVIDER_SITE_OTHER): Payer: 59 | Admitting: Allergy and Immunology

## 2016-02-27 ENCOUNTER — Encounter: Payer: Self-pay | Admitting: Allergy and Immunology

## 2016-02-27 VITALS — BP 116/76 | HR 96 | Temp 98.2°F | Resp 16

## 2016-02-27 DIAGNOSIS — H1010 Acute atopic conjunctivitis, unspecified eye: Secondary | ICD-10-CM | POA: Diagnosis not present

## 2016-02-27 DIAGNOSIS — J309 Allergic rhinitis, unspecified: Secondary | ICD-10-CM

## 2016-02-27 DIAGNOSIS — L509 Urticaria, unspecified: Secondary | ICD-10-CM | POA: Diagnosis not present

## 2016-02-27 NOTE — Patient Instructions (Signed)
    Dermatographism  Zyrtec 10mg  once to twice daily.  Add Zantac 150mg  each day if persisting hives.  If new episodes take picture and document environment, ingestion, and activity and exposure.  Call with update in the next 2 weeks.  Continue peanut avoidance.   EpiPen/Benadryl as needed.   Follow-up in one month or sooner if needed.

## 2016-02-27 NOTE — Progress Notes (Signed)
     FOLLOW UP NOTE  RE: MICKALA POTEAT MRN: [id number] DOB: [birth year] [facility] [address], [place] [address] Date of Office Visit: 02/27/2016  Subjective:  [name] is a 30 y.o. female who presents today for Allergic Reaction and Urticaria  Assessment:   1. Allergic rhinoconjunctivitis   2. Hives, clear skin today--suspected component of dermatographism.   3.      Peanut allergy--avoidance/emergency action plan in place. Plan:   Patient Instructions  1.   Reviewed component of Dermatographism. 2.   Zyrtec 10mg  once to twice daily. 3.   Add Zantac 150mg  each day if persisting hives. 4.   If new episodes take picture and document environment, ingestion, and activity and exposure. 5.   Call with update in the next 2 weeks and plan for selected labs at [facility]--specific IgE. 6.   Continue peanut avoidance.  7.   EpiPen/Benadryl as needed.   8.   Follow-up in one month or sooner if needed.  HPI: Burnett Burnett returns to the office in follow-up of allergic rhinoconjunctivitis but now concerned about skin issues.  She is avoiding peanuts without issue.  However she has had recent ?possible hive episodes, with unclear trigger.  She has taken Benadryl which is helpful, though usually drowsiness occurs.  She has not used any of the medications we discussed.  The last episode was one week ago after work with hive under eye and a puffiness at lip, which seem to resolve and recur over the next several days. Denies hand/feet swelling, throat irritation, change in breathing, dysphagia or any associated systemic symptoms.  She does not note any correlation to activity, exposure--showering etc.  Her meals may have been chocolate pudding popcorn, [place] wrap with avocado.  But several of the episodes resolve without management or persisting concerns.     She did have 3 treatments via laser for tattoo removal at her leg but that has not been the location of any  of her hives.  Denies ED or urgent care visits, prednisone or antibiotic courses. Reports sleep and activity are normal.  [name] has a current medication list which includes the following prescription(s): diphenhydramine hcl, epinephrine, and phentermine.   Drug Allergies: Allergies  Allergen Reactions  . Codeine     Nausea and vomiting    Objective:   Filed Vitals:   02/27/16 1538  BP: 116/76  Pulse: 96  Temp: 98.2 F (36.8 C)  Resp: 16   SpO2 Readings from Last 1 Encounters:  02/27/16 98%   Physical Exam  Constitutional: She is well-developed, well-nourished, and in no distress.  HENT:  Head: Atraumatic.  Right Ear: Tympanic membrane and ear canal normal.  Left Ear: Tympanic membrane and ear canal normal.  Nose: Mucosal edema present. No rhinorrhea. No epistaxis.  Mouth/Throat: Oropharynx is clear and moist and mucous membranes are normal. No oropharyngeal exudate, posterior oropharyngeal edema or posterior oropharyngeal erythema.  Neck: Neck supple.  Cardiovascular: Normal rate, S1 normal and S2 normal.   No murmur heard. Pulmonary/Chest: Effort normal. She has no wheezes. She has no rhonchi. She has no rales.  Lymphadenopathy:    She has no cervical adenopathy.  Skin: Skin is warm and dry. No rash noted. No cyanosis. Nails show no clubbing.  Defined redness with stroking of skin.     Burnett Burnett. Ishmael Holter, MD  cc: Sallee Lange, MD

## 2016-03-13 ENCOUNTER — Ambulatory Visit: Payer: 59 | Admitting: Nurse Practitioner

## 2016-03-13 ENCOUNTER — Ambulatory Visit (INDEPENDENT_AMBULATORY_CARE_PROVIDER_SITE_OTHER): Payer: 59 | Admitting: Nurse Practitioner

## 2016-03-13 ENCOUNTER — Encounter: Payer: Self-pay | Admitting: Nurse Practitioner

## 2016-03-13 VITALS — BP 128/80 | Temp 98.4°F | Ht 64.0 in | Wt 170.5 lb

## 2016-03-13 DIAGNOSIS — T7840XA Allergy, unspecified, initial encounter: Secondary | ICD-10-CM | POA: Diagnosis not present

## 2016-03-13 DIAGNOSIS — L509 Urticaria, unspecified: Secondary | ICD-10-CM | POA: Diagnosis not present

## 2016-03-13 NOTE — Patient Instructions (Signed)
claritin or allegra in the morning Benadryl at night  Zantac or Pepcid as directed Alpha gal

## 2016-03-15 ENCOUNTER — Telehealth: Payer: Self-pay | Admitting: Allergy and Immunology

## 2016-03-15 DIAGNOSIS — H52223 Regular astigmatism, bilateral: Secondary | ICD-10-CM | POA: Diagnosis not present

## 2016-03-15 DIAGNOSIS — H5213 Myopia, bilateral: Secondary | ICD-10-CM | POA: Diagnosis not present

## 2016-03-15 NOTE — Telephone Encounter (Signed)
Pt called and would like for you to send her for blood work for foods and stuff and then she would come in for a visit. [phone].

## 2016-03-16 ENCOUNTER — Encounter: Payer: Self-pay | Admitting: Nurse Practitioner

## 2016-03-16 DIAGNOSIS — L509 Urticaria, unspecified: Secondary | ICD-10-CM | POA: Insufficient documentation

## 2016-03-16 NOTE — Progress Notes (Signed)
Subjective:  Presents with complaints of off-and-on swelling in the lips that began about 3-1/2 weeks ago. Has also had a well-prepped develop over her eye at times. Resolves with Benadryl. Occurs about every other day. No wheezing, no difficulty breathing or swallowing. Had one episode of "a knot in the throat" that was uncomfortable one time. Resolved after taking Benadryl. Has had a workup with local allergy specialist back in December, was told she is allergic to peanut butter. Has not had any exposure to any of her allergens. Also describes occasional head itching with whelps. Has started daily Zyrtec which has helped. Has not been able to identify any specific triggers at this point. Is not having any issues today.  Objective:   BP 128/80 mmHg  Temp(Src) 98.4 F (36.9 C) (Oral)  Ht 5\' 4"  (1.626 m)  Wt 170 lb 8 oz (77.338 kg)  BMI 29.25 kg/m2 NAD. Alert, oriented. No facial edema. TMs normal limit. Pharynx clear. Neck supple with minimal adenopathy. Lungs clear. Heart regular rate rhythm.  Assessment:  Problem List Items Addressed This Visit      Musculoskeletal and Integument   Urticaria   Relevant Orders   Alpha-Gal Panel    Other Visit Diagnoses    Allergic reaction, initial encounter    -  Primary    Relevant Orders    Alpha-Gal Panel      Plan:  Meds ordered this encounter  Medications  . cetirizine (ZYRTEC) 10 MG tablet    Sig: Take 10 mg by mouth daily.   Hold on Zyrtec. claritin or allegra in the morning Benadryl at night  Zantac or Pepcid as directed Patient has an EpiPen in case of emergency. Lab testing pending. Recommend follow-up with Dr. Ishmael Holter for further evaluation. Seek help immediately if any anaphylactic symptoms.

## 2016-03-19 ENCOUNTER — Ambulatory Visit: Payer: 59 | Admitting: Allergy and Immunology

## 2016-03-20 DIAGNOSIS — M9905 Segmental and somatic dysfunction of pelvic region: Secondary | ICD-10-CM | POA: Diagnosis not present

## 2016-03-20 DIAGNOSIS — M9903 Segmental and somatic dysfunction of lumbar region: Secondary | ICD-10-CM | POA: Diagnosis not present

## 2016-03-20 DIAGNOSIS — M25552 Pain in left hip: Secondary | ICD-10-CM | POA: Diagnosis not present

## 2016-03-20 DIAGNOSIS — M545 Low back pain: Secondary | ICD-10-CM | POA: Diagnosis not present

## 2016-03-20 LAB — ALPHA-GAL PANEL
Alpha Gal IgE*: <0.1 kU/L (ref <0.35)
BEEF CLASS INTERPRETATION: 0
Beef (Bos spp) IgE: <0.1 kU/L (ref <0.35)
Class Interpretation: 0
Class Interpretation: 0

## 2016-03-25 NOTE — Telephone Encounter (Signed)
Pt was calling to check and see if Dr. Ishmael Holter  Has responded to her message on 03/15/16.   Pt is still breaking out in HIVES, her primary care has her taking a lot of different antihistamine to keep the HIVES down. All these medications are making her very sleepy during the day.  Pt Really wants to do blood work.  Please Advise  Thanks

## 2016-03-26 ENCOUNTER — Other Ambulatory Visit: Payer: Self-pay | Admitting: Allergy and Immunology

## 2016-03-26 NOTE — Telephone Encounter (Signed)
Review with patient additional specific exposure/ingestion concerns.

## 2016-03-26 NOTE — Telephone Encounter (Signed)
Spoke with patient:  Change to Xyzal 5mg  twice daily. (stop Zyrtec)    Add Allegra 180mg  midday.    Maintain Zantac.  Will obtain selected labs as reviewed.  100% avoidance of all fragrance soaps/lotions/detergents.  Return visit will review Xolair.  Patient agreed with plan.

## 2016-03-27 ENCOUNTER — Other Ambulatory Visit: Payer: Self-pay | Admitting: Allergy and Immunology

## 2016-03-27 DIAGNOSIS — L509 Urticaria, unspecified: Secondary | ICD-10-CM

## 2016-04-08 ENCOUNTER — Other Ambulatory Visit: Payer: Self-pay | Admitting: Nurse Practitioner

## 2016-04-09 MED FILL — PHENTERMINE 37.5 MG TABLET: 37.5 | 30 days supply | Qty: 30 | Fill #0

## 2016-04-18 ENCOUNTER — Telehealth: Payer: Self-pay | Admitting: Family Medicine

## 2016-04-18 NOTE — Telephone Encounter (Signed)
Error

## 2016-06-21 ENCOUNTER — Ambulatory Visit (INDEPENDENT_AMBULATORY_CARE_PROVIDER_SITE_OTHER): Payer: 59 | Admitting: Family Medicine

## 2016-06-21 VITALS — BP 118/82 | Ht 64.0 in | Wt 170.6 lb

## 2016-06-21 DIAGNOSIS — R5383 Other fatigue: Secondary | ICD-10-CM | POA: Diagnosis not present

## 2016-06-21 DIAGNOSIS — R21 Rash and other nonspecific skin eruption: Secondary | ICD-10-CM

## 2016-06-21 DIAGNOSIS — R635 Abnormal weight gain: Secondary | ICD-10-CM

## 2016-06-21 DIAGNOSIS — R000 Tachycardia, unspecified: Secondary | ICD-10-CM

## 2016-06-21 DIAGNOSIS — Z13220 Encounter for screening for lipoid disorders: Secondary | ICD-10-CM

## 2016-06-21 MED ORDER — TRIAMCINOLONE ACETONIDE 0.1 % EX CREA: 1.0000 "application " | TOPICAL_CREAM | Freq: Two times a day (BID) | CUTANEOUS | Status: DC

## 2016-06-21 MED ORDER — TRIAMCINOLONE ACETONIDE 0.025 % EX OINT: 1.0000 "application " | TOPICAL_OINTMENT | Freq: Two times a day (BID) | CUTANEOUS | Status: DC

## 2016-06-21 MED FILL — TRIAMCINOLONE 0.1% CREAM: 0.1 | 10 days supply | Qty: 45 | Fill #0

## 2016-06-21 NOTE — Progress Notes (Signed)
   Subjective:    Patient ID: Marissa Burnett, female    DOB: 1986/05/15, 30 y.o.   MRN: [id number]  HPI  Patient arrives for a general check up. She does not have any history of heart disease or family history heart disease there is some family history of thyroid issues in addition to this patient does try to do the best he can at staying healthy and exercising some. She does do cardio and denies any excessive tachycardia and she does not have any supraventricular tachycardia symptoms  Was curious about her resting heart rate runs borderline high and it goes higher with caffeine and alcohol-has discussed this before.   Patient also has a heat rash from laser treatments for tattoo removal and would like steroid cream.    Review of Systems Occasional tachycardia rates in the 90s or 100s denies chest pressure tightness pain shortness breath nausea vomiting diarrhea    Objective:   Physical Exam On exam patient's lungs were clear no crackles respiratory rate is normal heart was regular pulse normal no murmurs heard no edema.       Assessment & Plan:  Patient having a rash around the tattoo removal Kenalog cream twice a day when necessary  Mild intermittent tachycardia sometimes with minimal activity EKG looks good I do not recommend further testing other than lab work I instructed the patient how to watch for heart rates of 140 or above if these occur the patient needs to notify us we will set her up with cardiology for telemetry

## 2016-06-24 ENCOUNTER — Encounter: Payer: Self-pay | Admitting: Family Medicine

## 2016-07-08 ENCOUNTER — Telehealth: Payer: Self-pay | Admitting: Family Medicine

## 2016-07-08 ENCOUNTER — Other Ambulatory Visit: Payer: Self-pay | Admitting: *Deleted

## 2016-07-08 MED ORDER — FIRST-DUKES MOUTHWASH MT SUSP: OROMUCOSAL | Status: DC

## 2016-07-08 MED FILL — MAGIC MOUTHWASH BOP FORM: 7 days supply | Qty: 140 | Fill #0

## 2016-07-08 NOTE — Telephone Encounter (Signed)
Patient blistered the roof of her mouth over the weekend after she was served extremely hot food.  She said it hurts to eat and she is worried about getting an infection.  She wants to know if there is anything that we can call in for her?     [facility]

## 2016-07-08 NOTE — Telephone Encounter (Signed)
Med sent to pharm. Pt notified.  

## 2016-07-08 NOTE — Telephone Encounter (Signed)
[facility]  Magic mouthwash, 1 teaspoon swish and spit for 7 days, recheck if persistent trouble,

## 2016-07-08 NOTE — Telephone Encounter (Signed)
Discussed with pt. Pt is concerned because today is the 7th day and it is not getting better. No fever or signs of infections. Very painful and not able to eat well.

## 2016-07-08 NOTE — Telephone Encounter (Signed)
The chances of getting infection is very unlikely with something like this. The best thing to do is to make sure that she is rinsing her mouth after eating. Typically these areas will be very tender and painful for 7-10 days until the skin heals up. Topical anesthetics such as Orajel can be used to locally on those areas if the discomfort is bad. If she feels like it is getting infected please call us back.

## 2016-07-30 ENCOUNTER — Telehealth: Payer: Self-pay | Admitting: Family Medicine

## 2016-07-30 NOTE — Telephone Encounter (Signed)
Left message return call

## 2016-07-30 NOTE — Telephone Encounter (Signed)
Notified patient we will need to stop for 6 months; also very concerned about taking Phentermine since she was last seen for tachycardia. Patient verbalized understanding.

## 2016-07-30 NOTE — Telephone Encounter (Signed)
(  Message For [place]) patient requesting new prescription for phentermine was last seen 06/21/16 by Dr.Scott.

## 2016-07-30 NOTE — Telephone Encounter (Signed)
We will need to stop for 6 months; also very concerned about taking Phentermine since she was last seen for tachycardia.

## 2016-08-08 MED FILL — PHENTERMINE 37.5 MG TABLET: 37.5 | 30 days supply | Qty: 30 | Fill #0

## 2016-08-29 ENCOUNTER — Telehealth: Payer: Self-pay | Admitting: Nurse Practitioner

## 2016-08-29 NOTE — Telephone Encounter (Signed)
Pt is wanting to know if she can get an mri scheduled for hip pain. Pt was seen in march for it and it is not getting better even after seeing a chiropractor. Please advise.

## 2016-08-29 NOTE — Telephone Encounter (Signed)
Patient will need recent office visit to order MRI for insurance pre certification. Patient verbalized understanding and scheduled office visit.

## 2016-09-03 ENCOUNTER — Ambulatory Visit (INDEPENDENT_AMBULATORY_CARE_PROVIDER_SITE_OTHER): Payer: 59 | Admitting: Nurse Practitioner

## 2016-09-03 ENCOUNTER — Encounter: Payer: Self-pay | Admitting: Nurse Practitioner

## 2016-09-03 VITALS — BP 114/74 | Ht 64.0 in | Wt 172.1 lb

## 2016-09-03 DIAGNOSIS — M25552 Pain in left hip: Secondary | ICD-10-CM | POA: Diagnosis not present

## 2016-09-03 DIAGNOSIS — G8929 Other chronic pain: Secondary | ICD-10-CM

## 2016-09-03 MED ORDER — DICLOFENAC SODIUM 75 MG PO TBEC: 75.0000 mg | DELAYED_RELEASE_TABLET | Freq: Two times a day (BID) | ORAL | 0 refills | Status: DC

## 2016-09-03 MED FILL — DICLOFENAC SOD EC 75 MG TAB: 75 | 15 days supply | Qty: 30 | Fill #0

## 2016-09-03 NOTE — Patient Instructions (Signed)
Lidocaine patch biofreeze Ice/heat applications

## 2016-09-04 ENCOUNTER — Encounter: Payer: Self-pay | Admitting: Nurse Practitioner

## 2016-09-04 NOTE — Progress Notes (Signed)
Subjective:  Presents for c/o left hip pain off/on over the past year. No history of injury. Very active; working out on a regular basis. Worse with running or going up inclines. Difficulty walking at times. Took Ibuprofen before running this am which helped. Slight relief with chiropractor visits. Off/on low back pain but none recent. Localized area that radiates into the upper thigh. No persistent or unusual abdominal pain.   Objective:   BP 114/74   Ht 5\' 4"  (1.626 m)   Wt 172 lb 2 oz (78.1 kg)   BMI 29.55 kg/m  NAD. Alert, oriented. SLR neg bilat. Normal ROM of the left hip with minimal tenderness. Distinct area of tenderness to palpation at the anterior portion of the left hip. Reflexes normal. Gait slow but steady.   Assessment: Chronic hip pain, left - Plan: Ambulatory referral to Physical Therapy  Plan:  Meds ordered this encounter  Medications  . loratadine (CLARITIN) 10 MG tablet    Sig: Take 10 mg by mouth daily.  . diclofenac (VOLTAREN) 75 MG EC tablet    Sig: Take 1 tablet (75 mg total) by mouth 2 (two) times daily.    Dispense:  30 tablet    Refill:  0    Order Specific Question:   Supervising Provider    Answer:   Mikey Kirschner [[id number]]   Switch to Diclofenac.  Lidocaine patch biofreeze Ice/heat applications Schedule PT twice a week for 3 weeks. Call back at that time if no improvement, will refer to orthopedic specialist at that time.

## 2016-09-12 ENCOUNTER — Ambulatory Visit (HOSPITAL_COMMUNITY): Payer: Medicaid Other | Admitting: Physical Therapy

## 2016-09-17 ENCOUNTER — Ambulatory Visit (HOSPITAL_COMMUNITY): Payer: 59 | Attending: Family Medicine | Admitting: Physical Therapy

## 2016-09-17 DIAGNOSIS — M6281 Muscle weakness (generalized): Secondary | ICD-10-CM | POA: Diagnosis not present

## 2016-09-17 DIAGNOSIS — M25552 Pain in left hip: Secondary | ICD-10-CM | POA: Insufficient documentation

## 2016-09-17 DIAGNOSIS — R29898 Other symptoms and signs involving the musculoskeletal system: Secondary | ICD-10-CM | POA: Diagnosis not present

## 2016-09-17 NOTE — Therapy (Signed)
[place], [place], [address] Phone: [phone]   Fax:  [phone]  Physical Therapy Evaluation  Patient Details  Name: Marissa Burnett MRN: [id number] Date of Birth: Nov 14, 1986 Referring Provider: Sallee Lange   Encounter Date: 09/17/2016      PT End of Session - 09/17/16 0907    Visit Number 1   Number of Visits 8   Date for PT Re-Evaluation 10/15/16   Authorization Type UMR    Authorization Time Period 09/17/16 to 10/17/16   PT Start Time 0830  patient arrived late    PT Stop Time 0900   PT Time Calculation (min) 30 min   Activity Tolerance Patient tolerated treatment well   Behavior During Therapy [facility] for tasks assessed/performed      Past Medical History:  Diagnosis Date  . Allergy   . Anxiety   . Heart murmur birth    Past Surgical History:  Procedure Laterality Date  . CHOLECYSTECTOMY  02/2002  . GALLBLADDER SURGERY    . OTHER SURGICAL HISTORY     stent in kidney during pregnancy,2002; removed in 2003    There were no vitals filed for this visit.       Subjective Assessment - 09/17/16 0833    Subjective She reports that she has had L hip pain for more than a year; she is not aware of any trauma or incident that started the hip pain, she did gain 30 pounds during school and she is wondering if this is making her pain worse. Inclines and sitting in the car too long make her pain worse. She works 12 hour shifts at work and this can make her hip hurt as well. Anti-inflammatories have been helping. She has an old ankle injury that has started to flare up again recently. She initially hurt her ankle slipping on some ice while holding her child about 10 years ago. She did see a chiropractor, who told her she had a 42mm shift in her hips; she tried adjusments but it never helped.    Pertinent History no significan PMH    How long can you sit comfortably? primarily in car, 60 minutes    How long can  you stand comfortably? unlimited    How long can you walk comfortably? a long day walking at work can flare up hip    Diagnostic tests x-ray at chiropractor    Patient Stated Goals get rid of pain, get back to exercise program    Currently in Pain? Yes   Pain Score 3    Pain Location Ankle   Pain Orientation Right   Pain Descriptors / Indicators Aching;Dull   Pain Type Chronic pain   Pain Radiating Towards can radiate up to knee, possibly due to compensations    Pain Onset More than a month ago   Pain Frequency Intermittent   Aggravating Factors  weight bearing, going to gym and running    Pain Relieving Factors none    Effect of Pain on Daily Activities limits activity             [facility] PT Assessment - 09/17/16 0001      Assessment   Medical Diagnosis chronic L hip pain    Referring Provider Nicki Reaper Luking    Onset Date/Surgical Date --  over  a year ago    Next MD Visit not scheduled      Precautions   Precautions None     Balance  Screen   Has the patient fallen in the past 6 months No   Has the patient had a decrease in activity level because of a fear of falling?  No   Is the patient reluctant to leave their home because of a fear of falling?  No     Prior Function   Level of Independence Independent;Independent with basic ADLs   Vocation Full time employment   Vocation Requirements respiratory therapist      Observation/Other Assessments   Observations L LE longer than R with supine testing; scour (-) L LE, FABER and FADIR (+) L with lateral pinching symptoms      AROM   Overall AROM Comments increased pain L side of lumbar spine with flexion/extension; burning symptoms with repeated lumbar extension      Strength   Right Hip Flexion 5/5   Right Hip Extension 4/5   Right Hip ABduction 5/5   Left Hip Flexion 4+/5   Left Hip Extension 4+/5   Left Hip ABduction 4+/5   Right Knee Flexion 5/5   Right Knee Extension 4+/5   Left Knee Flexion 4+/5   Left Knee  Extension 4+/5   Right Ankle Dorsiflexion 5/5   Left Ankle Dorsiflexion 5/5     Palpation   Palpation comment tenderness noted lateral L glutes; no significant knotting/tender points noted throughotu lumbar paraspinals or other gluteal musculature      Ambulation/Gait   Gait Comments slightly shorter stance time L LE, positive [place], increased supination R ankle, proximal muscle weakness                            PT Education - 09/17/16 0906    Education provided Yes   Education Details prognosis, possible involvement of L hip and L lumbar spine, POC, HEP; can change focus to R ankle with appropriate MD order after L hip pain is resolved    Person(s) Educated Patient   Methods Explanation;Demonstration;Handout   Comprehension Verbalized understanding;Returned demonstration;Need further instruction          PT Short Term Goals - 09/17/16 1232      PT SHORT TERM GOAL #1   Title Patient to experience pain L hip no more than 3/10 during all functional weight bearing tasks and activities in order to improve QOL    Time 2   Period Weeks   Status New     PT SHORT TERM GOAL #2   Title Patient to be able to complete a full shift at work with pain no more than 3/10 L hip and no limping in order to improve QOL and demonstrate improved tolerance of extended standing/walking tasks    Time 2   Period Weeks   Status New     PT SHORT TERM GOAL #3   Title Patient to demonstrate full ROM in lumbar spine and L hip with no increased pain in order to show general improvement in condition    Time 2   Period Weeks   Status New     PT SHORT TERM GOAL #4   Title Patient to be independent in correctly and consistently performing appropriate HEP, to be updated PRN    Time 2   Period Weeks   Status New           PT Long Term Goals - 09/17/16 1234      PT LONG TERM GOAL #1   Title Patient to demonstrate strength 5/5  in all tested muscle groups in order to  assist in reducing pain and improving tolerance to extended CKC tasks    Time 4   Period Weeks   Status New     PT LONG TERM GOAL #2   Title Patient to be able to perform regular exercise routine with no limiitations, pain L hip no more than 2/10, in order to facilitate return to  PLOF    Time 4   Period Weeks   Status New     PT LONG TERM GOAL #3   Title Patient to be able to tolerate a busy full shift at work with L hip pain no more than 2/10 in order to improve QOL and job performance    Time 4   Period [place] - 09/17/16 0908    Clinical Impression Statement Patient arrives with chronic L hip pain which has been bothering her for over a year; she reports that it seemed to start with some weight gain in school, and has been limiting her ability to drive long distances in cars, walk for extended periods for her job, and perform her regular exercise regimen. Upon examination, patient does reveal functional gait impairment, functional muscle weakness, postural deficit, and does have discomfort in her L lower back with flexion and extension as well as repeated movements. She does have a small leg length discrepancy however do not suspect that this is playing a large role in her current pain as this is likely much more chronic than her hip pain. She also had chronic R ankle pain/injury however is agreeable to focusing on L hip first and moving to R ankle care down the line if needed.  Suspect possible impingement of soft tissues in hip as well as mobility deficit in lumbar spine causing her current L hip pain, and recommend skilled PT services to address functional limitations and assist in reaching optimal level of function.    Rehab Potential Excellent   Clinical Impairments Affecting Rehab Potential chronic R ankle pain/injury, history of lumbar pain/discomfort ever since child-birth    PT Frequency 2x / week   PT Duration 4 weeks   PT  Treatment/Interventions ADLs/Self Care Home Management;Biofeedback;Cryotherapy;Moist Heat;Gait training;Stair training;Functional mobility training;Therapeutic activities;Therapeutic exercise;Balance training;Neuromuscular re-education;Patient/family education;Manual techniques   PT Next Visit Plan review HEP and goals; core/proximal strength, LE strength, hip/lumbar mobility as needed    PT Home Exercise Plan 9/19: SKTC, lumbar rotations, piriformis stretch    Recommended Other Services skilled PT services for R ankle after L hip is resolved    Consulted and Agree with Plan of Care Patient      Patient will benefit from skilled therapeutic intervention in order to improve the following deficits and impairments:  Abnormal gait, Improper body mechanics, Pain, Decreased coordination, Postural dysfunction, Decreased strength, Difficulty walking, Impaired flexibility  Visit Diagnosis: Pain in left hip - Plan: PT plan of care cert/re-cert  Muscle weakness (generalized) - Plan: PT plan of care cert/re-cert  Other symptoms and signs involving the musculoskeletal system - Plan: PT plan of care cert/re-cert     Problem List Patient Active Problem List   Diagnosis Date Noted  . Urticaria 03/16/2016  . Morbid obesity ([place]) 07/04/2015  . Anxiety 06/30/2015  . ADD (attention deficit disorder) 07/20/2014    Deniece Ree PT, DPT [place]  [place], [place], [address] Phone: [phone]   Fax:  [phone]  Name: GERTUDE WECKER MRN: [id number] Date of Birth: [birth year]

## 2016-09-17 NOTE — Patient Instructions (Signed)
   SINGLE KNEE TO CHEST STRETCH - [place]  While Lying on your back, hold your knee and gently pull it up towards your chest.  Hold for 10 seconds and repeat 5-10 times each side, 1-2 times per day.     Lumbar Rotations   Lying on your back with your knees bent, slowly drop your legs to one side and hold the stretch. Come back to the middle and switch sides. You should feel the stretch in your back on the opposite side that your legs are leaning.  Hold each for 10 seconds, then switch sides. Repeat 5-10 times each side, 1-2 times per day.     PIRIFORMIS AND HIP STRETCH - SEATED  While sitting in a chair, cross your affected leg on top of the other as shown.   Next, gently lean forward until a stretch is felt along the crossed leg.  Hold for 30 seconds, and repeat twice each side, 1-2 times per day.

## 2016-09-19 ENCOUNTER — Telehealth (HOSPITAL_COMMUNITY): Payer: Self-pay

## 2016-09-19 ENCOUNTER — Ambulatory Visit (HOSPITAL_COMMUNITY): Payer: 59

## 2016-09-19 NOTE — Telephone Encounter (Signed)
09/19/16 pt left a message that she needed to reschedule today's appt

## 2016-09-24 ENCOUNTER — Ambulatory Visit (HOSPITAL_COMMUNITY): Payer: 59

## 2016-09-24 ENCOUNTER — Telehealth (HOSPITAL_COMMUNITY): Payer: Self-pay

## 2016-09-24 NOTE — Telephone Encounter (Signed)
09/24/16 pt left a messge that she had to work today and to cx her appt.  She will call back to reschedule

## 2016-10-09 ENCOUNTER — Ambulatory Visit (INDEPENDENT_AMBULATORY_CARE_PROVIDER_SITE_OTHER): Payer: 59 | Admitting: Adult Health

## 2016-10-09 ENCOUNTER — Encounter: Payer: Self-pay | Admitting: Adult Health

## 2016-10-09 ENCOUNTER — Other Ambulatory Visit (HOSPITAL_COMMUNITY)
Admission: RE | Admit: 2016-10-09 | Discharge: 2016-10-09 | Disposition: A | Discharge status: Home / Self Care | Payer: 59 | Source: Ambulatory Visit | Attending: Adult Health | Admitting: Adult Health

## 2016-10-09 VITALS — BP 110/62 | HR 86 | Ht 64.0 in | Wt 171.0 lb

## 2016-10-09 DIAGNOSIS — Z113 Encounter for screening for infections with a predominantly sexual mode of transmission: Secondary | ICD-10-CM | POA: Diagnosis not present

## 2016-10-09 DIAGNOSIS — Z01411 Encounter for gynecological examination (general) (routine) with abnormal findings: Secondary | ICD-10-CM | POA: Diagnosis not present

## 2016-10-09 DIAGNOSIS — R87611 Atypical squamous cells cannot exclude high grade squamous intraepithelial lesion on cytologic smear of cervix (ASC-H): Secondary | ICD-10-CM | POA: Diagnosis not present

## 2016-10-09 DIAGNOSIS — Z1151 Encounter for screening for human papillomavirus (HPV): Secondary | ICD-10-CM | POA: Insufficient documentation

## 2016-10-09 DIAGNOSIS — R12 Heartburn: Secondary | ICD-10-CM

## 2016-10-09 DIAGNOSIS — Z01419 Encounter for gynecological examination (general) (routine) without abnormal findings: Secondary | ICD-10-CM | POA: Diagnosis not present

## 2016-10-09 MED ORDER — ETONOGESTREL-ETHINYL ESTRADIOL 0.12-0.015 MG/24HR VA RING: VAGINAL_RING | VAGINAL | 4 refills | Status: DC

## 2016-10-09 MED ORDER — OMEPRAZOLE 20 MG PO CPDR: 20.0000 mg | DELAYED_RELEASE_CAPSULE | Freq: Every day | ORAL | 11 refills | Status: DC

## 2016-10-09 MED FILL — NUVARING VAGINAL RING: 0.12-0.015 | 84 days supply | Qty: 3 | Fill #0

## 2016-10-09 MED FILL — OMEPRAZOLE DR 20 MG CAPSULE: 20 | 30 days supply | Qty: 30 | Fill #0

## 2016-10-09 NOTE — Progress Notes (Signed)
Patient ID: ALASIA LANDHERR, female   DOB: 12/09/1986, 30 y.o.   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  PCP is Dr Lance Sell office.   Current Medications, Allergies, Past Medical History, Past Surgical History, Family History and Social History were reviewed in [employer] record.     Review of Systems: Patient denies any headaches, hearing loss, fatigue, blurred vision, shortness of breath, chest pain, abdominal pain, problems with bowel movements, urination, or intercourse. No joint pain or mood swings.See HPI for positives.    Physical Exam:BP 110/62 (BP Location: Left Arm, Patient Position: Sitting, Cuff Size: Normal)   Pulse 86   Ht 5\' 4"  (1.626 m)   Wt 171 lb (77.6 kg)   BMI 29.35 kg/m  General:  Well developed, well nourished, no acute distress Skin:  Warm and dry Neck:  Midline trachea, normal thyroid, good ROM, no lymphadenopathy Lungs; Clear to auscultation bilaterally Breast:  No dominant palpable mass, retraction, or nipple discharge Cardiovascular: Regular rate and rhythm Abdomen:  Soft, non tender, no hepatosplenomegaly Pelvic:  External genitalia is normal in appearance, no lesions.  The vagina is normal in appearance. Urethra has no lesions or masses. The cervix is bulbous.Pap with HPV and GC/CHL performed.  Uterus is felt to be normal size, shape, and contour.  No adnexal masses or tenderness noted.Bladder is non tender, no masses felt. Extremities/musculoskeletal:  No swelling or varicosities noted, no clubbing or cyanosis Psych:  No mood changes, alert and  cooperative,seems happy PHQ 2 score 0  Impression: 1. Encounter for gynecological examination with Papanicolaou smear of cervix   2. Burning reflux       Plan: Rx nuva ring Disp #3 with 4 refills, use 25 days in and 7 out Rx prilosec 20 mg #30 take 1 daily with 11 refills  Physical in 1 year, pap in 3 if normal

## 2016-10-09 NOTE — Patient Instructions (Signed)
Physical in 1 year, pap in 3 if this one normal

## 2016-10-10 LAB — CYTOLOGY - PAP

## 2016-10-15 ENCOUNTER — Encounter: Payer: Self-pay | Admitting: Adult Health

## 2016-10-15 ENCOUNTER — Telehealth: Payer: Self-pay | Admitting: Adult Health

## 2016-10-15 DIAGNOSIS — R87619 Unspecified abnormal cytological findings in specimens from cervix uteri: Secondary | ICD-10-CM

## 2016-10-15 DIAGNOSIS — R87612 Low grade squamous intraepithelial lesion on cytologic smear of cervix (LGSIL): Secondary | ICD-10-CM

## 2016-10-15 HISTORY — DX: Unspecified abnormal cytological findings in specimens from cervix uteri: R87.619

## 2016-10-15 NOTE — Telephone Encounter (Signed)
Pt aware pap is abnormal and needs colpo, and that GC/CHL negative.Appt made with Dr Elonda Husky.

## 2016-10-31 MED FILL — HYDROCODON-APAP 5-325: 5-325 | 3 days supply | Qty: 22 | Fill #0

## 2016-10-31 MED FILL — AMOXICILLIN 500 MG CAPSULE: 500 | 10 days supply | Qty: 30 | Fill #0

## 2016-10-31 MED FILL — IBUPROFEN 600 MG TABLET: 600 | 6 days supply | Qty: 24 | Fill #0

## 2016-11-04 ENCOUNTER — Ambulatory Visit (INDEPENDENT_AMBULATORY_CARE_PROVIDER_SITE_OTHER): Payer: 59 | Admitting: Obstetrics & Gynecology

## 2016-11-04 ENCOUNTER — Encounter: Payer: Self-pay | Admitting: Obstetrics & Gynecology

## 2016-11-04 ENCOUNTER — Other Ambulatory Visit: Payer: Self-pay | Admitting: Obstetrics & Gynecology

## 2016-11-04 VITALS — BP 130/90 | HR 80 | Ht 64.0 in | Wt 173.0 lb

## 2016-11-04 DIAGNOSIS — R87612 Low grade squamous intraepithelial lesion on cytologic smear of cervix (LGSIL): Secondary | ICD-10-CM | POA: Diagnosis not present

## 2016-11-04 DIAGNOSIS — Z3202 Encounter for pregnancy test, result negative: Secondary | ICD-10-CM | POA: Diagnosis not present

## 2016-11-04 DIAGNOSIS — N870 Mild cervical dysplasia: Secondary | ICD-10-CM | POA: Diagnosis not present

## 2016-11-04 DIAGNOSIS — D069 Carcinoma in situ of cervix, unspecified: Secondary | ICD-10-CM | POA: Diagnosis not present

## 2016-11-04 LAB — POCT URINE PREGNANCY: Preg Test, Ur: NEGATIVE

## 2016-11-04 NOTE — Addendum Note (Signed)
Addended by: Diona Fanti A on: 11/04/2016 11:12 AM   Modules accepted: Orders

## 2016-11-04 NOTE — Progress Notes (Signed)
Colposcopy Procedure Note:  Colposcopy Procedure Note  Indications: Pap smear 1 months ago showed: low-grade squamous intraepithelial neoplasia (LGSIL - encompassing HPV,mild dysplasia,CIN I). The prior pap showed no abnormalities.  Prior cervical/vaginal disease: normal exam without visible pathology. Prior cervical treatment: no treatment.  Smoker:  No. New sexual partner:  No.  :  History of abnormal Pap: yes, 5 years ago  Procedure Details  The risks and benefits of the procedure and Written informed consent obtained.  Speculum placed in vagina and excellent visualization of cervix achieved, cervix swabbed x 3 with acetic acid solution.  Findings: Cervix: visible lesion(s) at 10 o'clock, acetowhite lesion(s) noted at 10 o'clock and punctation noted at 10 o'clock; SCJ visualized - lesion at 10 o'clock. Vaginal inspection: normal without visible lesions. Vulvar colposcopy: vulvar colposcopy not performed.  Specimens: cervical biopsy  Complications: none.  Plan: Return to discuss Pathology results in 1 week.

## 2016-11-07 ENCOUNTER — Telehealth: Payer: Self-pay | Admitting: Obstetrics & Gynecology

## 2016-11-07 ENCOUNTER — Telehealth: Payer: Self-pay | Admitting: *Deleted

## 2016-11-07 NOTE — Telephone Encounter (Signed)
Spoke with patient regarding biopsy results. States she is concerned about it and wanted to talk with Dr Elonda Husky. I spoke with Dr Elonda Husky who stated that the follow-up appt was to discuss results and if she wanted to come in sooner she could. Call transferred to front desk to reschedule appt.

## 2016-11-07 NOTE — Telephone Encounter (Signed)
Dr Elonda Husky spoke at length with patient on the phone regarding results. Patient is still coming in for appt.

## 2016-11-14 ENCOUNTER — Encounter: Payer: Self-pay | Admitting: Obstetrics and Gynecology

## 2016-11-14 ENCOUNTER — Ambulatory Visit: Payer: 59 | Admitting: Obstetrics & Gynecology

## 2016-11-14 ENCOUNTER — Ambulatory Visit (INDEPENDENT_AMBULATORY_CARE_PROVIDER_SITE_OTHER): Payer: 59 | Admitting: Obstetrics and Gynecology

## 2016-11-14 DIAGNOSIS — D069 Carcinoma in situ of cervix, unspecified: Secondary | ICD-10-CM

## 2016-11-14 NOTE — Progress Notes (Signed)
Patient ID: Marissa HEPPLER, female   DOB: 02/01/1986, 30 y.o.   MRN: [id number] Preoperative History and Physical  Marissa Burnett is a 30 y.o. [id number] here for surgical management of abnormal pap smear and abnormal colposcopy significant of "TRANSFORMATION ZONE MUCOSA SHOWING HIGH GRADE SQUAMOUS INTRAEPITHELIAL LESION, CIN-III (SEVERE DYSPLASIA/CIS)." No significant preoperative concerns.  Pt notes that she has had two children and she is unsure if she wants more children at this time. 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 Pt hasn't tried any medications for the relief of her symptoms. Pt denies any other symptoms.   Proposed surgery: laser conization  Past Medical History:  Diagnosis Date  . Abnormal Papanicolaou smear of cervix 10/15/2016   LSIL+HPV  . Allergy   . Anxiety   . Heart murmur birth   Past Surgical History:  Procedure Laterality Date  . CHOLECYSTECTOMY  02/2002  . GALLBLADDER SURGERY    . OTHER SURGICAL HISTORY     stent in kidney during pregnancy,2002; removed in 2003   OB History  Gravida Para Term Preterm AB Living  4 2 2   2 2   SAB TAB Ectopic Multiple Live Births  2       2    # Outcome Date GA Lbr Len/2nd Weight Sex Delivery Anes PTL Lv  4 Term 09/09/05 [redacted]w[redacted]d  8 lb 2 oz (3.685 kg) F Vag-Spont   LIV  3 SAB 2004          2 Term 01/27/02 [redacted]w[redacted]d  7 lb 2 oz (3.232 kg) F Vag-Spont   LIV  1 SAB 2002            Patient denies any other pertinent gynecologic issues.   Current Outpatient Prescriptions on File Prior to Visit  Medication Sig Dispense Refill  . diclofenac (VOLTAREN) 75 MG EC tablet Take 1 tablet (75 mg total) by mouth 2 (two) times daily. 30 tablet 0  . DiphenhydrAMINE HCl (BENADRYL ALLERGY PO) Take by mouth as needed.     Marland Kitchen EPINEPHrine 0.3 mg/0.3 mL IJ SOAJ injection Inject 0.3 mLs (0.3 mg total) into the muscle once. 2 Device 1  . loratadine (CLARITIN) 10 MG tablet Take 10 mg by mouth daily.    Marland Kitchen omeprazole  (PRILOSEC) 20 MG capsule Take 1 capsule (20 mg total) by mouth daily. 30 capsule 11   No current facility-administered medications on file prior to visit.    Allergies  Allergen Reactions  . Codeine     Nausea and vomiting    Social History:   reports that she has never smoked. She has never used smokeless tobacco. She reports that she drinks alcohol. She reports that she does not use drugs.  Family History  Problem Relation Age of Onset  . Diabetes Other   . Cancer Paternal Grandfather     lung  . Diabetes Maternal Grandmother   . COPD Maternal Grandfather   . Heart disease Paternal Grandmother   . ADD / ADHD Daughter   . Anxiety disorder Daughter   . Allergic rhinitis Neg Hx   . Angioedema Neg Hx   . Asthma Neg Hx   . Atopy Neg Hx   . Eczema Neg Hx   . Immunodeficiency Neg Hx   . Urticaria Neg Hx     Review of Systems: Noncontributory  PHYSICAL EXAM: Blood pressure 124/64, pulse 95, height 5\' 4"  (1.626 m), weight 169 lb (76.7 kg), last menstrual period 10/29/2016. General appearance -  alert, well appearing, and in no distress Chest - clear to auscultation, no wheezes, rales or rhonchi, symmetric air entry Heart - normal rate and regular rhythm Abdomen - soft, nontender, nondistended, no masses or organomegaly Pelvic - normal external genitalia, vulva, vagina, cervix, uterus and adnexa,  VULVA: normal appearing vulva with no masses, tenderness or lesions,  VAGINA: normal appearing vagina with normal color and discharge, no lesions, good support CERVIX: normal appearing cervix without discharge, multiparous os, small, 2 mm irregular nodule on the anterior cervicovesical at 1 o'clock.  UTERUS: uterus is normal size, shape, consistency and nontender, well supported.  ADNEXA: normal adnexa in size, nontender and no masses Extremities - peripheral pulses normal, no pedal edema, no clubbing or cyanosis  Discussion: 1. Discussed with pt risks and benefits of laser  conization  At end of discussion, pt had opportunity to ask questions and has no further questions at this time.   Specific discussion of cervical conization as noted above. Greater than 50% was spent in counseling and coordination of care with the patient. Will plan to do as a Cold Knife Conization (CKC)   Total time greater than: 25 minutes.    Labs: Results for orders placed or performed in visit on 11/04/16 (from the past 336 hour(s))  POCT urine pregnancy   Collection Time: 11/04/16 10:50 AM  Result Value Ref Range   Preg Test, Ur Negative Negative    Imaging Studies: No results found.  Assessment: CIN-III Small cervical nodule 12 oclock. Patient Active Problem List   Diagnosis Date Noted  . Abnormal Papanicolaou smear of cervix 10/15/2016  . Urticaria 03/16/2016  . Morbid obesity ([place]) 07/04/2015  . Anxiety 06/30/2015  . ADD (attention deficit disorder) 07/20/2014    Plan: Patient will undergo surgical management with laser conization.    Jonnie Kind, MD 11/14/2016 11:21 AM  By signing my name below, I, Soijett Blue, attest that this documentation has been prepared under the direction and in the presence of Jonnie Kind, MD. Electronically Signed: Soijett Blue, ED Scribe. 11/14/16. 11:21 AM.  I personally performed the services described in this documentation, which was SCRIBED in my presence. The recorded information has been reviewed and considered accurate. It has been edited as necessary during review. Jonnie Kind, MD

## 2016-11-18 DIAGNOSIS — D069 Carcinoma in situ of cervix, unspecified: Secondary | ICD-10-CM | POA: Insufficient documentation

## 2016-12-03 ENCOUNTER — Other Ambulatory Visit: Payer: Self-pay | Admitting: Nurse Practitioner

## 2016-12-05 NOTE — Patient Instructions (Signed)
Marissa Burnett  12/05/2016     @PREFPERIOPPHARMACY @   Your procedure is scheduled on 12/13/2016.  Report to [facility] at 10:30 A.M.  Call this number if you have problems the morning of surgery:  [phone]   Remember:  Do not eat food or drink liquids after midnight.  Take these medicines the morning of surgery with A SIP OF WATER Voltaren, Claritin, Prilosec   Do not wear jewelry, make-up or nail polish.  Do not wear lotions, powders, or perfumes, or deoderant.  Do not shave 48 hours prior to surgery.  Men may shave face and neck.  Do not bring valuables to the hospital.  [facility] is not responsible for any belongings or valuables.  Contacts, dentures or bridgework may not be worn into surgery.  Leave your suitcase in the car.  After surgery it may be brought to your room.  For patients admitted to the hospital, discharge time will be determined by your treatment team.  Patients discharged the day of surgery will not be allowed to drive home.    Please read over the following fact sheets that you were given. Surgical Site Infection Prevention and Anesthesia Post-op Instructions     PATIENT INSTRUCTIONS POST-ANESTHESIA  IMMEDIATELY FOLLOWING SURGERY:  Do not drive or operate machinery for the first twenty four hours after surgery.  Do not make any important decisions for twenty four hours after surgery or while taking narcotic pain medications or sedatives.  If you develop intractable nausea and vomiting or a severe headache please notify your doctor immediately.  FOLLOW-UP:  Please make an appointment with your surgeon as instructed. You do not need to follow up with anesthesia unless specifically instructed to do so.  WOUND CARE INSTRUCTIONS (if applicable):  Keep a dry clean dressing on the anesthesia/puncture wound site if there is drainage.  Once the wound has quit draining you may leave it open to air.  Generally you should leave the bandage intact for twenty  four hours unless there is drainage.  If the epidural site drains for more than 36-48 hours please call the anesthesia department.  QUESTIONS?:  Please feel free to call your physician or the hospital operator if you have any questions, and they will be happy to assist you.      Conization of the Cervix Cervical conization is the cutting (excision) of a cone-shaped portion of the cervix. The procedure is performed through the vagina in either your health care provider's office or an operating room. This procedure is usually done when there is abnormal bleeding from the cervix. It can also be done to evaluate an abnormal Pap test or if an abnormality is seen on the cervix during an exam. The tissue is then examined to see if there are precancerous cells or cancer present.  Conization of the cervix is not done during a menstrual period or pregnancy.  LET [facility] CARE PROVIDER KNOW ABOUT:  Any allergies you have.   All medicines you are taking, including vitamins, herbs, eye drops, creams, and over-the-counter medicines.   Previous problems you or members of your family have had with the use of anesthetics.   Any blood disorders you have.   Previous surgeries you have had.   Medical conditions you have.   Your smoking habits.   The possibility of being pregnant.  RISKS AND COMPLICATIONS  Generally, conization of the cervix is a safe procedure. However, as with any procedure, complications can occur. Possible complications include:  Heavy bleeding several days or weeks after the procedure. Light bleeding or spotting after the procedure is normal.  Infection (rare).  Damage to the cervix or surrounding organs (uncommon).   Problems with the anesthesia.   Increased risk of preterm labor in future pregnancies. BEFORE THE PROCEDURE  Do not eat or drink anything for 6-8 hours before the procedure.   Do not take aspirin or blood thinners for at least a week before the  procedure or as directed by your health care provider.   Arrange for someone to take you home after the procedure.  PROCEDURE There are three different methods to perform conization of the cervix. These include:   The cold knife method. In this method a small cone-shaped sample of tissue is cut out with a knife (scalpel) from the cervical canal and the transformation zone (where the normal cells end and the abnormal cells begin).   The LEEP method. In this method a small cone-shaped sample of tissue is cut out with a thin wire that can burn (cauterize) the cervical tissue with an electrical current.   Laser treatment. In this method a small cone-shaped sample of tissue is cut out and then cauterized with a laser beam to prevent bleeding.  The procedure will be performed as follows:   Depending on the method, you will either be given a medicine to make you sleep (general anesthetic) or a numbing medicine (local anesthetic). A medicine that numbs the cervix (cervical block) may be given.   A lubricated device called a speculum will be inserted into the vagina to spread open the walls of the vagina. This will help your health care provider see the inside of the vagina and cervix better.   The tissue from the cervix will be removed and examined.   The results of the procedure will help your health care provider decide if further treatment is necessary. They will also help your health care provider decide on the best treatment if your results are abnormal. AFTER THE PROCEDURE  If you had a general anesthetic, you may be groggy for 2-3 hours after the procedure.   If you had a local anesthetic, you will rest at the clinic or hospital until you are stable and feel ready to go home.   Recovery may take up to 3 weeks.   You may have some cramping for about 1 week.   You may have bloody discharge or light bleeding for 1-2 weeks.   You may have black discharge coming from the  vagina. This is from the paste used on the cervix to prevent bleeding. This is normal discharge.  This information is not intended to replace advice given to you by your health care provider. Make sure you discuss any questions you have with your health care provider. Document Released: 09/25/2005 Document Revised: 12/21/2013 Document Reviewed: 06/11/2013 Elsevier Interactive Patient Education  2017 [employer].

## 2016-12-06 ENCOUNTER — Encounter (HOSPITAL_COMMUNITY): Payer: Self-pay

## 2016-12-06 ENCOUNTER — Encounter (HOSPITAL_COMMUNITY)
Admission: RE | Admit: 2016-12-06 | Discharge: 2016-12-06 | Disposition: A | Discharge status: Home / Self Care | Payer: 59 | Source: Ambulatory Visit | Attending: Obstetrics and Gynecology | Admitting: Obstetrics and Gynecology

## 2016-12-06 ENCOUNTER — Other Ambulatory Visit: Payer: Self-pay | Admitting: Obstetrics and Gynecology

## 2016-12-06 DIAGNOSIS — Z01810 Encounter for preprocedural cardiovascular examination: Secondary | ICD-10-CM | POA: Insufficient documentation

## 2016-12-06 DIAGNOSIS — R002 Palpitations: Secondary | ICD-10-CM | POA: Diagnosis not present

## 2016-12-06 DIAGNOSIS — Z01812 Encounter for preprocedural laboratory examination: Secondary | ICD-10-CM | POA: Insufficient documentation

## 2016-12-06 DIAGNOSIS — R011 Cardiac murmur, unspecified: Secondary | ICD-10-CM | POA: Diagnosis not present

## 2016-12-06 HISTORY — DX: Cardiac arrhythmia, unspecified: I49.9

## 2016-12-06 HISTORY — DX: Gastro-esophageal reflux disease without esophagitis: K21.9

## 2016-12-06 LAB — COMPREHENSIVE METABOLIC PANEL
ALBUMIN: 3.9 g/dL (ref 3.5–5.0)
ALT: 16 U/L (ref 14–54)
ANION GAP: 5 (ref 5–15)
AST: 17 U/L (ref 15–41)
Alkaline Phosphatase: 59 U/L (ref 38–126)
BUN: 18 mg/dL (ref 6–20)
CHLORIDE: 108 mmol/L (ref 101–111)
CO2: 26 mmol/L (ref 22–32)
Calcium: 8.9 mg/dL (ref 8.9–10.3)
Creatinine, Ser: 0.58 mg/dL (ref 0.44–1.00)
GFR calc Af Amer: >60 mL/min (ref >60)
GFR calc non Af Amer: >60 mL/min (ref >60)
GLUCOSE: 85 mg/dL (ref 65–99)
POTASSIUM: 4.2 mmol/L (ref 3.5–5.1)
SODIUM: 139 mmol/L (ref 135–145)
Total Bilirubin: 0.7 mg/dL (ref 0.3–1.2)
Total Protein: 6.5 g/dL (ref 6.5–8.1)

## 2016-12-06 LAB — CBC
HCT: 40 % (ref 36.0–46.0)
Hemoglobin: 13.2 g/dL (ref 12.0–15.0)
MCH: 30.6 pg (ref 26.0–34.0)
MCHC: 33 g/dL (ref 30.0–36.0)
MCV: 92.6 fL (ref 78.0–100.0)
PLATELETS: 176 10*3/uL (ref 150–400)
RBC: 4.32 MIL/uL (ref 3.87–5.11)
RDW: 12.4 % (ref 11.5–15.5)
WBC: 5.4 10*3/uL (ref 4.0–10.5)

## 2016-12-06 LAB — HCG, SERUM, QUALITATIVE: Preg, Serum: NEGATIVE

## 2016-12-06 MED FILL — DICLOFENAC SOD 75 MG TAB EC: 75 | 15 days supply | Qty: 30 | Fill #0

## 2016-12-13 ENCOUNTER — Encounter (HOSPITAL_COMMUNITY): Payer: Self-pay | Admitting: Anesthesiology

## 2016-12-13 ENCOUNTER — Encounter (HOSPITAL_COMMUNITY)
Admission: RE | Disposition: A | Discharge status: Home / Self Care | Payer: Self-pay | Source: Ambulatory Visit | Attending: Obstetrics and Gynecology

## 2016-12-13 ENCOUNTER — Ambulatory Visit (HOSPITAL_COMMUNITY)
Admission: RE | Admit: 2016-12-13 | Discharge: 2016-12-13 | Disposition: A | Discharge status: Home / Self Care | Payer: 59 | Source: Ambulatory Visit | Attending: Obstetrics and Gynecology | Admitting: Obstetrics and Gynecology

## 2016-12-13 ENCOUNTER — Ambulatory Visit (HOSPITAL_COMMUNITY): Payer: 59 | Admitting: Anesthesiology

## 2016-12-13 DIAGNOSIS — R011 Cardiac murmur, unspecified: Secondary | ICD-10-CM | POA: Insufficient documentation

## 2016-12-13 DIAGNOSIS — Z833 Family history of diabetes mellitus: Secondary | ICD-10-CM | POA: Diagnosis not present

## 2016-12-13 DIAGNOSIS — F419 Anxiety disorder, unspecified: Secondary | ICD-10-CM | POA: Diagnosis not present

## 2016-12-13 DIAGNOSIS — D069 Carcinoma in situ of cervix, unspecified: Secondary | ICD-10-CM | POA: Diagnosis not present

## 2016-12-13 DIAGNOSIS — Z801 Family history of malignant neoplasm of trachea, bronchus and lung: Secondary | ICD-10-CM | POA: Insufficient documentation

## 2016-12-13 DIAGNOSIS — Z825 Family history of asthma and other chronic lower respiratory diseases: Secondary | ICD-10-CM | POA: Diagnosis not present

## 2016-12-13 DIAGNOSIS — Z885 Allergy status to narcotic agent status: Secondary | ICD-10-CM | POA: Insufficient documentation

## 2016-12-13 DIAGNOSIS — K219 Gastro-esophageal reflux disease without esophagitis: Secondary | ICD-10-CM | POA: Diagnosis not present

## 2016-12-13 DIAGNOSIS — D067 Carcinoma in situ of other parts of cervix: Secondary | ICD-10-CM | POA: Insufficient documentation

## 2016-12-13 HISTORY — PX: CERVICAL CONIZATION W/BX: SHX1330

## 2016-12-13 SURGERY — CONE BIOPSY, CERVIX
Anesthesia: General | Site: Vagina

## 2016-12-13 MED ORDER — IODINE STRONG (LUGOLS) 5 % PO SOLN
ORAL | Status: AC
  Filled 2016-12-13: qty 1

## 2016-12-13 MED ORDER — ONDANSETRON HCL 4 MG/2ML IJ SOLN
INTRAMUSCULAR | Status: AC
  Filled 2016-12-13: qty 2

## 2016-12-13 MED ORDER — ACETIC ACID 5 % SOLN
Status: DC | PRN
  Administered 2016-12-13: 1 via TOPICAL

## 2016-12-13 MED ORDER — BUPIVACAINE HCL (PF) 0.5 % IJ SOLN
INTRAMUSCULAR | Status: AC
  Filled 2016-12-13: qty 30

## 2016-12-13 MED ORDER — FENTANYL CITRATE (PF) 100 MCG/2ML IJ SOLN
INTRAMUSCULAR | Status: AC
  Filled 2016-12-13: qty 2

## 2016-12-13 MED ORDER — PROPOFOL 10 MG/ML IV BOLUS
INTRAVENOUS | Status: DC | PRN
  Administered 2016-12-13: 50 mg via INTRAVENOUS
  Administered 2016-12-13: 200 mg via INTRAVENOUS

## 2016-12-13 MED ORDER — BUPIVACAINE-EPINEPHRINE (PF) 0.25% -1:200000 IJ SOLN
INTRAMUSCULAR | Status: DC | PRN
  Administered 2016-12-13: 7 mL via PERINEURAL

## 2016-12-13 MED ORDER — LACTATED RINGERS IV SOLN
INTRAVENOUS | Status: DC
  Administered 2016-12-13 (×2): via INTRAVENOUS

## 2016-12-13 MED ORDER — MIDAZOLAM HCL 2 MG/2ML IJ SOLN
INTRAMUSCULAR | Status: AC
  Filled 2016-12-13: qty 2

## 2016-12-13 MED ORDER — LIDOCAINE 2% (20 MG/ML) 5 ML SYRINGE
INTRAMUSCULAR | Status: DC | PRN
  Administered 2016-12-13: 50 mg via INTRAVENOUS

## 2016-12-13 MED ORDER — BUPIVACAINE-EPINEPHRINE (PF) 0.25% -1:200000 IJ SOLN
INTRAMUSCULAR | Status: AC
  Filled 2016-12-13: qty 30

## 2016-12-13 MED ORDER — FERRIC SUBSULFATE 259 MG/GM EX SOLN
CUTANEOUS | Status: AC
Start: 2016-12-13 — End: 2016-12-13
  Filled 2016-12-13: qty 8

## 2016-12-13 MED ORDER — LIDOCAINE HCL (PF) 1 % IJ SOLN
INTRAMUSCULAR | Status: AC
  Filled 2016-12-13: qty 5

## 2016-12-13 MED ORDER — ONDANSETRON HCL 4 MG/2ML IJ SOLN
4.0000 mg | Freq: Once | INTRAMUSCULAR | Status: AC
  Administered 2016-12-13: 4 mg via INTRAVENOUS

## 2016-12-13 MED ORDER — METRONIDAZOLE 0.75 % VA GEL: 1.0000 | VAGINAL | 1 refills | Status: DC | PRN

## 2016-12-13 MED ORDER — MIDAZOLAM HCL 2 MG/2ML IJ SOLN
1.0000 mg | INTRAMUSCULAR | Status: DC | PRN
  Administered 2016-12-13 (×2): 2 mg via INTRAVENOUS

## 2016-12-13 MED ORDER — FENTANYL CITRATE (PF) 100 MCG/2ML IJ SOLN
25.0000 ug | INTRAMUSCULAR | Status: DC | PRN
  Administered 2016-12-13: 25 ug via INTRAVENOUS
  Filled 2016-12-13: qty 2

## 2016-12-13 MED ORDER — FENTANYL CITRATE (PF) 100 MCG/2ML IJ SOLN
25.0000 ug | INTRAMUSCULAR | Status: AC | PRN
  Administered 2016-12-13 (×2): 25 ug via INTRAVENOUS

## 2016-12-13 MED ORDER — FENTANYL CITRATE (PF) 100 MCG/2ML IJ SOLN
INTRAMUSCULAR | Status: DC | PRN
  Administered 2016-12-13: 50 ug via INTRAVENOUS
  Administered 2016-12-13: 25 ug via INTRAVENOUS
  Administered 2016-12-13: 50 ug via INTRAVENOUS

## 2016-12-13 MED ORDER — PROPOFOL 10 MG/ML IV BOLUS
INTRAVENOUS | Status: AC
  Filled 2016-12-13: qty 20

## 2016-12-13 MED ORDER — TRAMADOL HCL 50 MG PO TABS: 50.0000 mg | ORAL_TABLET | Freq: Four times a day (QID) | ORAL | 0 refills | Status: DC | PRN

## 2016-12-13 MED ORDER — FERRIC SUBSULFATE 259 MG/GM EX SOLN
CUTANEOUS | Status: DC | PRN
  Administered 2016-12-13: 1

## 2016-12-13 SURGICAL SUPPLY — 27 items
BAG HAMPER (MISCELLANEOUS) ×2 IMPLANT
BLADE SURG 15 STRL LF DISP TIS (BLADE) ×1 IMPLANT
BLADE SURG 15 STRL SS (BLADE) ×2
CATH ROBINSON RED A/P 16FR (CATHETERS) IMPLANT
CLOTH BEACON ORANGE TIMEOUT ST (SAFETY) ×2 IMPLANT
COVER LIGHT HANDLE STERIS (MISCELLANEOUS) ×4 IMPLANT
DECANTER SPIKE VIAL GLASS SM (MISCELLANEOUS) ×2 IMPLANT
DRAPE PROXIMA HALF (DRAPES) ×2 IMPLANT
ELECT REM PT RETURN 9FT ADLT (ELECTROSURGICAL) ×2
ELECTRODE REM PT RTRN 9FT ADLT (ELECTROSURGICAL) ×1 IMPLANT
FORMALIN 10 PREFIL 120ML (MISCELLANEOUS) ×2 IMPLANT
GLOVE BIOGEL PI IND STRL 7.0 (GLOVE) ×1 IMPLANT
GLOVE BIOGEL PI IND STRL 9 (GLOVE) ×1 IMPLANT
GLOVE BIOGEL PI INDICATOR 7.0 (GLOVE) ×1
GLOVE BIOGEL PI INDICATOR 9 (GLOVE) ×1
GLOVE ECLIPSE 9.0 STRL (GLOVE) ×2 IMPLANT
GOWN SPEC L3 XXLG W/TWL (GOWN DISPOSABLE) ×2 IMPLANT
GOWN STRL REUS W/TWL LRG LVL3 (GOWN DISPOSABLE) ×2 IMPLANT
KIT ROOM TURNOVER AP CYSTO (KITS) ×2 IMPLANT
MANIFOLD NEPTUNE II (INSTRUMENTS) ×2 IMPLANT
NDL HYPO 21X1.5 SAFETY (NEEDLE) ×1 IMPLANT
NEEDLE HYPO 21X1.5 SAFETY (NEEDLE) ×2 IMPLANT
PACK PERI GYN (CUSTOM PROCEDURE TRAY) ×2 IMPLANT
PAD ARMBOARD 7.5X6 YLW CONV (MISCELLANEOUS) ×2 IMPLANT
SET BASIN LINEN APH (SET/KITS/TRAYS/PACK) ×2 IMPLANT
SUT CHROMIC 2 0 CT 1 (SUTURE) ×4 IMPLANT
SYR CONTROL 10ML LL (SYRINGE) ×2 IMPLANT

## 2016-12-13 NOTE — Op Note (Signed)
Please see the brief operative note for surgical details 

## 2016-12-13 NOTE — Anesthesia Procedure Notes (Signed)
Procedure Name: LMA Insertion Date/Time: 12/13/2016 12:35 PM Performed by: Lieutenant Diego Pre-anesthesia Checklist: Patient identified, Emergency Drugs available, Suction available and Patient being monitored Patient Re-evaluated:Patient Re-evaluated prior to inductionOxygen Delivery Method: Circle system utilized Preoxygenation: Pre-oxygenation with 100% oxygen Intubation Type: IV induction Ventilation: Mask ventilation without difficulty LMA: LMA inserted LMA Size: 4.0 Number of attempts: 1 Airway Equipment and Method: Bite block Placement Confirmation: positive ETCO2 and breath sounds checked- equal and bilateral Tube secured with: Tape Dental Injury: Teeth and Oropharynx as per pre-operative assessment

## 2016-12-13 NOTE — Transfer of Care (Signed)
Immediate Anesthesia Transfer of Care Note  Patient: Marissa Burnett  Procedure(s) Performed: Procedure(s): CONIZATION CERVIX WITH BIOPSY--cold knife conization (N/A)  Patient Location: PACU  Anesthesia Type:General  Level of Consciousness: awake and alert   Airway & Oxygen Therapy: Patient Spontanous Breathing and Patient connected to face mask oxygen  Post-op Assessment: Report given to RN and Post -op Vital signs reviewed and stable  Post vital signs: Reviewed and stable  Last Vitals:  Vitals:   12/13/16 1120 12/13/16 1125  BP:  119/80  Pulse:    Resp: (!) 27 (!) 50  Temp:      Last Pain:  Vitals:   12/13/16 1053  TempSrc: Oral      Patients Stated Pain Goal: 3 ([id number])  Complications: No apparent anesthesia complications

## 2016-12-13 NOTE — Anesthesia Preprocedure Evaluation (Signed)
Anesthesia Evaluation  Patient identified by MRN, date of birth, ID band Patient awake    Reviewed: Allergy & Precautions, NPO status , Patient's Chart, lab work & pertinent test results  Airway Mallampati: I  TM Distance: >3 FB Neck ROM: Full    Dental  (+) Teeth Intact   Pulmonary neg pulmonary ROS,    breath sounds clear to auscultation       Cardiovascular + dysrhythmias ( history of palpitations )  Rhythm:Regular Rate:Normal     Neuro/Psych PSYCHIATRIC DISORDERS (ADD) Anxiety    GI/Hepatic GERD  Medicated and Controlled,  Endo/Other    Renal/GU      Musculoskeletal   Abdominal   Peds  Hematology   Anesthesia Other Findings   Reproductive/Obstetrics                             Anesthesia Physical Anesthesia Plan  ASA: II  Anesthesia Plan: General   Post-op Pain Management:    Induction: Intravenous  Airway Management Planned: LMA  Additional Equipment:   Intra-op Plan:   Post-operative Plan: Extubation in OR  Informed Consent: I have reviewed the patients History and Physical, chart, labs and discussed the procedure including the risks, benefits and alternatives for the proposed anesthesia with the patient or authorized representative who has indicated his/her understanding and acceptance.     Plan Discussed with:   Anesthesia Plan Comments:         Anesthesia Quick Evaluation

## 2016-12-13 NOTE — H&P (Signed)
Patient ID: Marissa Burnett, female   DOB: 1986/05/26, 30 y.o.   MRN: [id number] Preoperative History and Physical  Marissa Burnett is a 30 y.o. [id number] here for surgical management of abnormal pap smear and abnormal colposcopy significant of "TRANSFORMATION ZONE MUCOSA SHOWING HIGH GRADE SQUAMOUS INTRAEPITHELIAL LESION, CIN-III (SEVERE DYSPLASIA/CIS)." No significant preoperative concerns.  Pt notes that she has had two children and she is unsure if she wants more children at this time. 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 Pt hasn't tried any medications for the relief of her symptoms. Pt denies any other symptoms.   Proposed surgery: laser conization      Past Medical History:  Diagnosis Date  . Abnormal Papanicolaou smear of cervix 10/15/2016   LSIL+HPV  . Allergy   . Anxiety   . Heart murmur birth        Past Surgical History:  Procedure Laterality Date  . CHOLECYSTECTOMY  02/2002  . GALLBLADDER SURGERY    . OTHER SURGICAL HISTORY     stent in kidney during pregnancy,2002; removed in 2003                   OB History  Gravida Para Term Preterm AB Living  4 2 2   2 2   SAB TAB Ectopic Multiple Live Births  2       2    # Outcome Date GA Lbr Len/2nd Weight Sex Delivery Anes PTL Lv  4 Term 09/09/05 [redacted]w[redacted]d  8 lb 2 oz (3.685 kg) F Vag-Spont   LIV  3 SAB 2004          2 Term 01/27/02 [redacted]w[redacted]d  7 lb 2 oz (3.232 kg) F Vag-Spont   LIV  1 SAB 2002            Patient denies any other pertinent gynecologic issues.         Current Outpatient Prescriptions on File Prior to Visit  Medication Sig Dispense Refill  . diclofenac (VOLTAREN) 75 MG EC tablet Take 1 tablet (75 mg total) by mouth 2 (two) times daily. 30 tablet 0  . DiphenhydrAMINE HCl (BENADRYL ALLERGY PO) Take by mouth as needed.     Marland Kitchen EPINEPHrine 0.3 mg/0.3 mL IJ SOAJ injection Inject 0.3 mLs (0.3 mg total) into the muscle once. 2 Device 1  .  loratadine (CLARITIN) 10 MG tablet Take 10 mg by mouth daily.    Marland Kitchen omeprazole (PRILOSEC) 20 MG capsule Take 1 capsule (20 mg total) by mouth daily. 30 capsule 11   No current facility-administered medications on file prior to visit.         Allergies  Allergen Reactions  . Codeine     Nausea and vomiting    Social History:   reports that she has never smoked. She has never used smokeless tobacco. She reports that she drinks alcohol. She reports that she does not use drugs.        Family History  Problem Relation Age of Onset  . Diabetes Other   . Cancer Paternal Grandfather     lung  . Diabetes Maternal Grandmother   . COPD Maternal Grandfather   . Heart disease Paternal Grandmother   . ADD / ADHD Daughter   . Anxiety disorder Daughter   . Allergic rhinitis Neg Hx   . Angioedema Neg Hx   . Asthma Neg Hx   . Atopy Neg Hx   . Eczema Neg Hx   .  Immunodeficiency Neg Hx   . Urticaria Neg Hx     Review of Systems: Noncontributory  PHYSICAL EXAM: Blood pressure 124/64, pulse 95, height 5\' 4"  (1.626 m), weight 169 lb (76.7 kg), last menstrual period 10/29/2016. General appearance - alert, well appearing, and in no distress Chest - clear to auscultation, no wheezes, rales or rhonchi, symmetric air entry Heart - normal rate and regular rhythm Abdomen - soft, nontender, nondistended, no masses or organomegaly Pelvic - normal external genitalia, vulva, vagina, cervix, uterus and adnexa,  VULVA: normal appearing vulva with no masses, tenderness or lesions,  VAGINA: normal appearing vagina with normal color and discharge, no lesions, good support CERVIX: normal appearing cervix without discharge, multiparous os, small, 2 mm irregular nodule on the anterior cervicovesical at 1 o'clock.  UTERUS: uterus is normal size, shape, consistency and nontender, well supported.  ADNEXA: normal adnexa in size, nontender and no masses Extremities - peripheral pulses  normal, no pedal edema, no clubbing or cyanosis  Discussion: 1. Discussed with pt risks and benefits of cervical conization  At end of discussion, pt had opportunity to ask questions and has no further questions at this time.   Specific discussion of cervical conization as noted above. Greater than 50% was spent in counseling and coordination of care with the patient. Will plan to do as a Cold Knife Conization (CKC)  Risks alternatives  Total time greater than: 25 minutes.

## 2016-12-13 NOTE — Discharge Instructions (Signed)
Dr. Glo Herring may be reached at [phone] on the cell this weekend Conization of the Cervix, Care After Refer to this sheet in the next few weeks. These instructions provide you with information on caring for yourself after your procedure. Your health care provider may also give you more specific instructions. Your treatment has been planned according to current medical practices but problems sometimes occur. Call your health care provider if you have any problems or questions after your procedure. WHAT TO EXPECT AFTER THE PROCEDURE After your procedure, it is typical to have the following sensations:  If you had a general anesthetic, you may be groggy for 2-3 hours after the procedure.  You may have cramps (similar to menstrual cramps) for about 1 week.   You may have a bloody discharge or light to moderate bleeding for 1-2 weeks. The bleeding should not be heavy (for example, it should not soak 1 pad in less than 1 hour).  You may have a black vaginal discharge that looks similar to coffee grounds. This is from the paste that was applied to the cervix to control bleeding. This is normal. Recovery may take up to 3 weeks.  HOME CARE INSTRUCTIONS   Arrange for someone to drive you home after the procedure.  Only take medicines as directed by your health care provider. Do not take aspirin. It can cause bleeding.   Take showers for the first week. Do not take baths, swim, or use hot tubs until your health care provider says it is okay.   Do not douche, use tampons, or have sexual intercourse until your health care provider says it is okay.   Avoid strenuous activities, exercises, and heavy lifting for at least 7-14 days.  You may resume your normal diet unless your health care provider advises you differently.    If you are constipated, you may:  Take a mild laxative as directed by your health care provider.   Add fruit and bran to your diet.   Make sure to drink enough  fluids to keep your urine clear or pale yellow.  Keep follow-up appointments with your health care provider. SEEK MEDICAL CARE IF:   You develop a rash.   You are dizzy or lightheaded.   You feel nauseous.   You develop a bad smelling vaginal discharge. SEEK IMMEDIATE MEDICAL CARE IF:   You have blood clots or bleeding that is heavier than a normal menstrual period (for example, soaking a pad in less than 1 hour) or you develop bright red bleeding.   You have a fever over 101F (38.3C) or persistent symptoms for more than 2-3 days.   You have a fever over 101F (38.3C) and your symptoms suddenly get worse.  You have increasing cramps.   You faint.   You have pain when urinating.  You have bloody urine.   You start vomiting.   Your pain is not relieved with your medicine.   Your have severe or worsening pain. MAKE SURE YOU:  Understand these instructions.  Will watch your condition.  Will get help right away if you are not doing well or get worse. This information is not intended to replace advice given to you by your health care provider. Make sure you discuss any questions you have with your health care provider. Document Released: 12/16/2005 Document Revised: 12/21/2013 Document Reviewed: 06/11/2013 Elsevier Interactive Patient Education  2017 [employer].

## 2016-12-13 NOTE — Anesthesia Postprocedure Evaluation (Signed)
Anesthesia Post Note  Patient: Marissa Burnett  Procedure(s) Performed: Procedure(s) (LRB): CONIZATION CERVIX WITH BIOPSY--cold knife conization (N/A)  Patient location during evaluation: PACU Anesthesia Type: General Level of consciousness: awake, oriented and patient cooperative Pain management: pain level controlled Vital Signs Assessment: post-procedure vital signs reviewed and stable Respiratory status: spontaneous breathing, nonlabored ventilation and respiratory function stable Cardiovascular status: blood pressure returned to baseline Postop Assessment: no signs of nausea or vomiting and adequate PO intake Anesthetic complications: no    Last Vitals:  Vitals:   12/13/16 1330 12/13/16 1343  BP: 107/72   Pulse: 100 87  Resp: (!) 8 17  Temp:      Last Pain:  Vitals:   12/13/16 1343  TempSrc:   PainSc: 5                  Dlynn Ranes J

## 2016-12-13 NOTE — Brief Op Note (Signed)
12/13/2016  1:26 PM  PATIENT:  Marissa Burnett  30 y.o. female  PRE-OPERATIVE DIAGNOSIS:  severe cervical dysplasia--CIN III  POST-OPERATIVE DIAGNOSIS:  severe cervical dysplasia--CIN III  PROCEDURE:  Procedure(s): CONIZATION CERVIX WITH BIOPSY--cold knife conization (N/A)  SURGEON:  Surgeon(s) and Role:    * Jonnie Kind, MD - Primary  PHYSICIAN ASSISTANT:   ASSISTANTS: none   ANESTHESIA:   general and paracervical block  EBL:  Total I/O In: 1200 [I.V.:1200] Out: -   BLOOD ADMINISTERED:none  DRAINS: none   LOCAL MEDICATIONS USED:  MARCAINE    and Amount: 10 ml  SPECIMEN:  Source of Specimen:  Conization specimen marked at 12:00 with suture  DISPOSITION OF SPECIMEN:  PATHOLOGY  COUNTS:  YES  TOURNIQUET:  * No tourniquets in log *  DICTATION: .Dragon Dictation  PLAN OF CARE: Discharge to home after PACU  PATIENT DISPOSITION:  PACU - hemodynamically stable.   Delay start of Pharmacological VTE agent (>24hrs) due to surgical blood loss or risk of bleeding: not applicable Details of procedure. Patient was taken to the operating room prepped and draped for vaginal procedure with legs supported in lithotomy candycane stirrups. Was conducted and procedure confirmed by surgical team. Speculum was inserted, cervix visualized. Stitches were placed at 3:00 and 9:00 to catch the cervical branches of the uterine vessels and reduce blood supply. These were tagged for orientation and traction. Marcaine was injected 10 cc in a circumferential fashion around the cervix and 3% acetic acid swab was placed against the cervix for 3 minutes. No areas of white epithelium that became visible the cervix was then sharply incised and its periphery in a circumferential fashion through the epithelium, approximately a 2.5 cm diameter specimen which was sharply and incised with a #15 blade and a conical fashion, beginning anterior and dissecting down removing a 2 cm deep by 2.5 cm wide specimen  that was tagged at 12:00. Sturmdorf type sutures were placed at 12:00 and 6:00 to reduce oozing and cautery used as necessary to reduce further oozing down, down to minimal bleeding at this time Monsel solution was applied to the cervix and was inspected after two-minute observation and minimal bleeding encountered. Patient was allowed to go recovery room in stable condition and will be followed up in our office in a couple weeks EBL 10 cc

## 2016-12-16 ENCOUNTER — Encounter (HOSPITAL_COMMUNITY): Payer: Self-pay | Admitting: Obstetrics and Gynecology

## 2016-12-17 ENCOUNTER — Telehealth: Payer: Self-pay | Admitting: Obstetrics and Gynecology

## 2016-12-17 NOTE — Telephone Encounter (Signed)
Pt called stating that she would like to know the results of her procedure. Pt know that she has an appointment on the 27th but she looked on mychart and noticed that the results were in and she can not wait this long. Please contact pt

## 2016-12-25 ENCOUNTER — Encounter: Payer: 59 | Admitting: Obstetrics and Gynecology

## 2017-01-01 ENCOUNTER — Encounter: Payer: Self-pay | Admitting: Obstetrics and Gynecology

## 2017-01-01 ENCOUNTER — Ambulatory Visit (INDEPENDENT_AMBULATORY_CARE_PROVIDER_SITE_OTHER): Payer: 59 | Admitting: Obstetrics and Gynecology

## 2017-01-01 VITALS — BP 110/73 | HR 91

## 2017-01-01 DIAGNOSIS — Z9889 Other specified postprocedural states: Secondary | ICD-10-CM

## 2017-01-01 DIAGNOSIS — Z09 Encounter for follow-up examination after completed treatment for conditions other than malignant neoplasm: Secondary | ICD-10-CM

## 2017-01-01 DIAGNOSIS — D069 Carcinoma in situ of cervix, unspecified: Secondary | ICD-10-CM

## 2017-01-01 NOTE — Progress Notes (Signed)
Patient ID: Marissa Burnett, female   DOB: [birth year], 31 y.o.   MRN: [id number] Subjective:  Marissa Burnett is a 31 y.o. female now 2 weeks 4 days status post Cold Knife Conization (CKC). Pt notes that she has been actively running since last week with no complications.   . Pt denies any symptoms at this time.    Review of Systems Negative except    Diet:     Bowel movements : normal.  The patient is not having any pain.  Objective:  BP 110/73 (BP Location: Right Arm, Patient Position: Sitting, Cuff Size: Normal)   Pulse 91   LMP 11/29/2016  General:Well developed, well nourished.  No acute distress. Abdomen: Bowel sounds normal, soft, non-tender. Pelvic Exam:    External Genitalia:  Normal.    Vagina: Normal    Cervix: 80% healed, with some eversion.    Uterus: Normal    Adnexa/Bimanual: Normal  Incision(s):   Healing well, no drainage, no erythema, no hernia, no swelling, no dehiscence,  Assessment:  Post-Op 2 weeks 4 days s/p Cold Knife Conization (CKC)   Doing well postoperatively.   Plan:  1.Wound care discussed  2. Current medications. 3. Activity restrictions: sexual activity advised for 2 weeks from today.  4. return to work: not applicable. 5. Follow up in 1 year for a pap smear or PRN.Marland Kitchen  By signing my name below, I, Soijett Blue, attest that this documentation has been prepared under the direction and in the presence of Jonnie Kind, MD. Electronically Signed: Soijett Blue, ED Scribe. 01/01/17. 2:52 PM.  I personally performed the services described in this documentation, which was SCRIBED in my presence. The recorded information has been reviewed and considered accurate. It has been edited as necessary during review. Jonnie Kind, MD

## 2017-03-21 ENCOUNTER — Encounter: Payer: Self-pay | Admitting: Family Medicine

## 2017-03-21 ENCOUNTER — Ambulatory Visit (INDEPENDENT_AMBULATORY_CARE_PROVIDER_SITE_OTHER): Payer: 59 | Admitting: Family Medicine

## 2017-03-21 VITALS — Temp 98.4°F | Ht 64.0 in | Wt 170.0 lb

## 2017-03-21 DIAGNOSIS — J0110 Acute frontal sinusitis, unspecified: Secondary | ICD-10-CM | POA: Diagnosis not present

## 2017-03-21 MED ORDER — AMOXICILLIN 500 MG PO CAPS: 500.0000 mg | ORAL_CAPSULE | Freq: Three times a day (TID) | ORAL | 0 refills | Status: DC

## 2017-03-21 NOTE — Progress Notes (Signed)
   Subjective:    Patient ID: Marissa Burnett, female    DOB: [birth year], 31 y.o.   MRN: [id number]  Sinusitis  This is a new problem. Episode onset: one day. Associated symptoms include congestion, ear pain, headaches and a sore throat.    Throat pain bad four ibuprofen helped  Runny nose   gteen and yellow and gunky  Little coughing and litlle sneezing   Lots of fatigue  achey and pain in the head for two weeks, Left lateral temporal region. No visual symptoms.  Temple a      Review of Systems  HENT: Positive for congestion, ear pain and sore throat.   Neurological: Positive for headaches.       Objective:   Physical Exam  Alert, mild malaise. Hydration good Vitals stable. frontal/ maxillary tenderness evident positive nasal congestion. pharynx normal neck supple  lungs clear/no crackles or wheezes. heart regular in rhythm Left temple region slight tenderness to palpation.      Assessment & Plan:  Impression rhinosinusitis likely post viral, discussed with patient. plan antibiotics prescribed. Questions answered. Symptomatic care discussed. warning signs discussed. WSL Patient has atypical left lateral frontal hypersensitivity. Patient has appointment with her ophthalmologist. Advised that this headache persists consider getting a sedimentation rate. Awfully unusual for a patient this age to develop temporal arteritis but if headache persists would recommend this

## 2017-04-16 MED FILL — CEPHALEXIN 500 MG CAPSULE: 500 | 5 days supply | Qty: 15 | Fill #0

## 2017-04-16 MED FILL — OXYCODONE W/APAP 5/325 TAB: 5-325 | 4 days supply | Qty: 40 | Fill #0

## 2017-04-16 MED FILL — PROMETHAZINE 12.5 MG TABLET: 12.5 | 3 days supply | Qty: 10 | Fill #0

## 2017-05-04 ENCOUNTER — Ambulatory Visit (HOSPITAL_COMMUNITY)
Admission: EM | Admit: 2017-05-04 | Discharge: 2017-05-04 | Disposition: A | Discharge status: Home / Self Care | Payer: 59 | Attending: Internal Medicine | Admitting: Internal Medicine

## 2017-05-04 ENCOUNTER — Encounter (HOSPITAL_COMMUNITY): Payer: Self-pay

## 2017-05-04 DIAGNOSIS — T7840XA Allergy, unspecified, initial encounter: Secondary | ICD-10-CM | POA: Diagnosis not present

## 2017-05-04 DIAGNOSIS — L089 Local infection of the skin and subcutaneous tissue, unspecified: Secondary | ICD-10-CM | POA: Diagnosis not present

## 2017-05-04 DIAGNOSIS — L509 Urticaria, unspecified: Secondary | ICD-10-CM

## 2017-05-04 MED ORDER — PREDNISONE 50 MG PO TABS: ORAL_TABLET | ORAL | 0 refills | Status: DC

## 2017-05-04 MED ORDER — TRIAMCINOLONE ACETONIDE 40 MG/ML IJ SUSP
40.0000 mg | Freq: Once | INTRAMUSCULAR | Status: AC
  Administered 2017-05-04: 40 mg via INTRAMUSCULAR

## 2017-05-04 MED ORDER — TRIAMCINOLONE ACETONIDE 40 MG/ML IJ SUSP
INTRAMUSCULAR | Status: AC
  Filled 2017-05-04: qty 1

## 2017-05-04 NOTE — Discharge Instructions (Signed)
The skin rash is typical of hives or urticaria. This usually results from an allergic type reaction. This is possibly due to the antibiotic or possible peanut exposure or may be completely something else. The treatment is similar. He may continue taking the Benadryl and the Zyrtec or Claritin. He will receive an injection today of a steroid and start the oral prednisone tomorrow. Take with food to protect stomach. If he develops swelling, trouble breathing or becoming worse and other way seek medical attention promptly. The redness of the bellybutton does not appear to be bacterial type infection. It looks more like a fungal infection. Apply either Lamisil cream twice a day or Lotrimin AF cream twice a day. Otherwise keep the area dry and use a hair dryer after bathing. If after using one of these creams for 2-3 days and it is getting worse then stop using it.

## 2017-05-04 NOTE — ED Triage Notes (Signed)
Pt having all over her body and has been taking benadryl and zyrtec. Swelling in her finger tips and was taking an antibiotic for a tummy tuck and a breast augmentation and called her provider and was told to stop the medication this morning. Did not remember to bring it and has taken the medication before without any problems. The rash is itchy, no fever.

## 2017-05-04 NOTE — ED Provider Notes (Signed)
CSN: [id number]     Arrival date & time 05/04/17  1257 History   None    Chief Complaint  Patient presents with  . Rash   (Consider location/radiation/quality/duration/timing/severity/associated sxs/prior Treatment) 31 year old female states that she developed bumps on her legs and arms this morning which then spread to the torso and back. She took Benadryl, went to sleep and when she awoke she felt all better. A little later the rash returned. Approximately 2 weeks ago she had a tummy tuck and breast augmentation. She had been prescribed cefalexin right after the procedure and then when she called about a possible infection of the umbilicus she was recently prescribed another dose of Keflex. She is uncertain as to whether this may have been calls of the reaction that she was also at a restaurant/bar last night and states she may have been exposed to peanuts for which she is allergic. Denies problems with swelling, breathing, cough, shortness of breath or other allergy symptoms. She is concerned about the erythema of her umbilicus. She also would like to have her wounds checked that are located on the chest wall just below the crease of the breasts.      Past Medical History:  Diagnosis Date  . Abnormal Papanicolaou smear of cervix 10/15/2016   LSIL+HPV  . Allergy   . Anxiety   . Dysrhythmia    history of palpitations   . GERD (gastroesophageal reflux disease)   . Heart murmur birth   Past Surgical History:  Procedure Laterality Date  . CERVICAL CONIZATION W/BX N/A 12/13/2016   Procedure: CONIZATION CERVIX WITH BIOPSY--cold knife conization;  Surgeon: Jonnie Kind, MD;  Location: [facility];  Service: Gynecology;  Laterality: N/A;  . CHOLECYSTECTOMY  02/2002  . ENDOSCOPIC RETROGRADE CHOLANGIOPANCREATOGRAPHY (ERCP) WITH PROPOFOL    . GALLBLADDER SURGERY    . OTHER SURGICAL HISTORY     stent in kidney during pregnancy,2002; removed in 2003   Family History  Problem Relation Age of  Onset  . Diabetes Other   . Cancer Paternal Grandfather     lung  . Diabetes Maternal Grandmother   . COPD Maternal Grandfather   . Heart disease Paternal Grandmother   . ADD / ADHD Daughter   . Anxiety disorder Daughter   . Allergic rhinitis Neg Hx   . Angioedema Neg Hx   . Asthma Neg Hx   . Atopy Neg Hx   . Eczema Neg Hx   . Immunodeficiency Neg Hx   . Urticaria Neg Hx    Social History  Substance Use Topics  . Smoking status: Never Smoker  . Smokeless tobacco: Never Used  . Alcohol use Yes     Comment: social   OB History    Gravida Para Term Preterm AB Living   4 2 2   2 2    SAB TAB Ectopic Multiple Live Births   2       2     Review of Systems  Constitutional: Negative.   HENT: Negative.   Eyes: Negative.   Respiratory: Negative.  Negative for cough, shortness of breath, wheezing and stridor.   Gastrointestinal: Negative.   Skin: Positive for color change and rash.  Neurological: Negative.     Allergies  Peanut-containing drug products  Home Medications   Prior to Admission medications   Medication Sig Start Date End Date Taking? Authorizing Provider  EPINEPHrine 0.3 mg/0.3 mL IJ SOAJ injection Inject 0.3 mLs (0.3 mg total) into the muscle once. 01/17/16  Yes Gean Quint, MD  loratadine (CLARITIN) 10 MG tablet Take 10 mg by mouth daily.   Yes [provider]  omeprazole (PRILOSEC) 20 MG capsule Take 1 capsule (20 mg total) by mouth daily. 10/09/16  Yes Estill Dooms, NP  acetaminophen (TYLENOL) 325 MG tablet Take 650 mg by mouth every 6 (six) hours as needed.    [provider]  BIOTIN PO Take 1 tablet by mouth daily.    [provider]  diclofenac (VOLTAREN) 75 MG EC tablet TAKE 1 TABLET BY MOUTH TWICE DAILY 12/06/16   Nilda Simmer, NP  ibuprofen (ADVIL,MOTRIN) 200 MG tablet Take 800 mg by mouth every 6 (six) hours as needed for mild pain.    [provider]  predniSONE (DELTASONE) 50 MG tablet 1 tab po  daily for 6 days. Take with food. Start 05/05/17. 05/04/17   Janne Napoleon, NP   Meds Ordered and Administered this Visit  Medications - No data to display  BP 123/68 (BP Location: Right Arm)   Pulse 100   Temp 98.7 F (37.1 C) (Oral)   Resp 20   LMP 04/20/2017 (Exact Date)   SpO2 100%  No data found.   Physical Exam  Constitutional: She is oriented to person, place, and time. She appears well-developed and well-nourished. No distress.  HENT:  Mouth/Throat: Oropharynx is clear and moist. No oropharyngeal exudate.  No intraoral swelling, edema, erythema. Airway widely patent. No swelling or thickness around the neck. No stridor. Speech is clear and normal.  Eyes: Conjunctivae and EOM are normal.  Neck: Normal range of motion. Neck supple.  Cardiovascular: Normal rate, regular rhythm, normal heart sounds and intact distal pulses.   Pulmonary/Chest: Effort normal and breath sounds normal. No respiratory distress. She has no wheezes.  Abdominal: She exhibits no distension.  Abdomen is soft but mildly tender status post the tummy tack. Umbilicus with deep erythema within the can cavity. There is no erythema extending from the umbilicus. Or surrounding the umbilicus. Currently no liquid is oozing from the umbilicus.  Musculoskeletal: She exhibits no edema.  Neurological: She is alert and oriented to person, place, and time.  Skin: Skin is warm and dry. Capillary refill takes less than 2 seconds.  The chest wall just inferior to the crease of the breast was examined while patient remained completely covered. The surgical scars and wounds are healing nicely. No signs of infection. The left incision has a 4 x 3 cm open area which is healing by secondary intention. No cellulitis. Currently no drainage. The scar/incision marks all appear to be healing well and no signs of cellulitis or infection are noted.  Psychiatric: She has a normal mood and affect. Her behavior is normal.  Nursing note and vitals  reviewed.   Urgent Care Course     Procedures (including critical care time)  Labs Review Labs Reviewed - No data to display  Imaging Review No results found.   Visual Acuity Review  Right Eye Distance:   Left Eye Distance:   Bilateral Distance:    Right Eye Near:   Left Eye Near:    Bilateral Near:         MDM   1. Hives   2. Allergic reaction, initial encounter   3. Local skin infection    The skin rash is typical of hives or urticaria. This usually results from an allergic type reaction. This is possibly due to the antibiotic or possible peanut exposure or may be  completely something else. The treatment is similar. He may continue taking the Benadryl and the Zyrtec or Claritin. He will receive an injection today of a steroid and start the oral prednisone tomorrow. Take with food to protect stomach. If he develops swelling, trouble breathing or becoming worse and other way seek medical attention promptly. Stop taking the current antibiotic. The redness of the bellybutton does not appear to be bacterial type infection. It looks more like a fungal infection. Apply either Lamisil cream twice a day or Lotrimin AF cream twice a day. Otherwise keep the area dry and use a hair dryer after bathing. If after using one of these creams for 2-3 days and it is getting worse then stop using it.    Janne Napoleon, NP 05/04/17 1441

## 2017-05-08 ENCOUNTER — Encounter: Payer: Self-pay | Admitting: Family Medicine

## 2017-05-29 MED FILL — SSD 1% CREAM: 1 | 30 days supply | Qty: 100 | Fill #0

## 2017-06-30 ENCOUNTER — Other Ambulatory Visit: Payer: Self-pay | Admitting: Nurse Practitioner

## 2017-06-30 MED FILL — OMEPRAZOLE DR 20 MG CAPSULE: 20 | 30 days supply | Qty: 30 | Fill #1

## 2017-06-30 MED FILL — DICLOFENAC SOD 75 MG TAB EC: 75 | 15 days supply | Qty: 30 | Fill #0

## 2017-07-11 ENCOUNTER — Other Ambulatory Visit: Payer: Self-pay

## 2017-08-22 ENCOUNTER — Encounter: Payer: Self-pay | Admitting: Family Medicine

## 2017-08-22 ENCOUNTER — Ambulatory Visit (INDEPENDENT_AMBULATORY_CARE_PROVIDER_SITE_OTHER): Payer: 59 | Admitting: Family Medicine

## 2017-08-22 VITALS — BP 112/80 | Ht 64.0 in | Wt 164.0 lb

## 2017-08-22 DIAGNOSIS — F419 Anxiety disorder, unspecified: Secondary | ICD-10-CM

## 2017-08-22 MED ORDER — EPINEPHRINE 0.3 MG/0.3ML IJ SOAJ: 0.3000 mg | Freq: Once | INTRAMUSCULAR | 4 refills | Status: AC

## 2017-08-22 MED ORDER — SERTRALINE HCL 50 MG PO TABS: 50.0000 mg | ORAL_TABLET | Freq: Every day | ORAL | 1 refills | Status: DC

## 2017-08-22 MED FILL — SERTRALINE HCL 50 MG TABLET: 50 | 90 days supply | Qty: 90 | Fill #0

## 2017-08-22 MED FILL — EPINEPHRINE 0.3 MG AUTO-INJ: 0.3 | 30 days supply | Qty: 2 | Fill #0

## 2017-08-22 NOTE — Progress Notes (Signed)
   Subjective:    Patient ID: Marissa Burnett, female    DOB: 1986-11-15, 31 y.o.   MRN: [id number]  Anxiety     Patient is here today states she is about to take on a new job, and will have to take a test for this new position. She is nervous about it. Says she is not sure if she needs something for anxiety or something to make her focus more. She does not want any medications that will cause her to gain weight she states she was on Celexa in the past and it caused her to gain thirty pound in three year period of time.  Needs refill on Epi pen.  Review of Systems Patient denies chest tightness pressure pain shortness breath and wheezing denies being suicidal    Objective:   Physical Exam Anxiety and depression questionnaire filled out Patient not depressed Moderate anxiety related issues Greater than 15 minutes spent greater than half in discussion and answering questions etc.       Assessment & Plan:  Anxiety related issues Do not feel the patient has depression Recommend low-dose Zoloft [place] feedback in a few weeks how this is going Follow-up in a few months If any problems notify us.

## 2017-09-05 MED FILL — OMEPRAZOLE 20 MG CAP: 20 | 30 days supply | Qty: 30 | Fill #2

## 2017-10-13 ENCOUNTER — Ambulatory Visit (INDEPENDENT_AMBULATORY_CARE_PROVIDER_SITE_OTHER): Payer: 59 | Admitting: Obstetrics and Gynecology

## 2017-10-13 ENCOUNTER — Encounter: Payer: Self-pay | Admitting: Obstetrics and Gynecology

## 2017-10-13 ENCOUNTER — Other Ambulatory Visit (HOSPITAL_COMMUNITY)
Admission: RE | Admit: 2017-10-13 | Discharge: 2017-10-13 | Disposition: A | Discharge status: Home / Self Care | Payer: 59 | Source: Ambulatory Visit | Attending: Obstetrics and Gynecology | Admitting: Obstetrics and Gynecology

## 2017-10-13 DIAGNOSIS — Z01419 Encounter for gynecological examination (general) (routine) without abnormal findings: Secondary | ICD-10-CM | POA: Diagnosis not present

## 2017-10-13 DIAGNOSIS — Z131 Encounter for screening for diabetes mellitus: Secondary | ICD-10-CM

## 2017-10-13 NOTE — Progress Notes (Addendum)
Assessment:  Annual Gyn Exam Hx CIN III with CKC 2017, clear margins.to receive pap q yr x 89yr. Plan:  1. pap smear done today, next pap due in 1 year 2. return annually or prn 3    Annual mammogram advised after age 31 4    TSH, thyroid, and liver panel testing  Subjective:  Marissa Burnett is a 31 y.o. female [phone] who presents for annual exam. Patient's last menstrual period was 10/08/2017. The patient has no complaints today. In April of this year, pt had a tummy tuck and breast augmentation done.In this year  Pt had a conization cervix with biopsy CKN for severe cervical dysplasia - CIN III on 12/13/2016. Her last pap was on 10/09/16 with abnormal results of LSIL + HPV.  Pt wanted to ask about blood-work due to renewing her insurance at this time.   The following portions of the patient's history were reviewed and updated as appropriate: allergies, current medications, past family history, past medical history, past social history, past surgical history and problem list. Past Medical History:  Diagnosis Date  . Abnormal Papanicolaou smear of cervix 10/15/2016   LSIL+HPV  . Allergy   . Anxiety   . Dysrhythmia    history of palpitations   . GERD (gastroesophageal reflux disease)   . Heart murmur birth    Past Surgical History:  Procedure Laterality Date  . ABDOMINOPLASTY    . BREAST ENHANCEMENT SURGERY    . CERVICAL CONIZATION W/BX N/A 12/13/2016   Procedure: CONIZATION CERVIX WITH BIOPSY--cold knife conization;  Surgeon: Jonnie Kind, MD;  Location: [facility];  Service: Gynecology;  Laterality: N/A;  . CHOLECYSTECTOMY  02/2002  . ENDOSCOPIC RETROGRADE CHOLANGIOPANCREATOGRAPHY (ERCP) WITH PROPOFOL    . GALLBLADDER SURGERY    . OTHER SURGICAL HISTORY     stent in kidney during pregnancy,2002; removed in 2003     Current Outpatient Prescriptions:  .  BIOTIN PO, Take 1 tablet by mouth daily., Disp: , Rfl:  .  ibuprofen (ADVIL,MOTRIN) 200 MG tablet, Take 800 mg by  mouth every 6 (six) hours as needed for mild pain., Disp: , Rfl:  .  loratadine (CLARITIN) 10 MG tablet, Take 10 mg by mouth daily., Disp: , Rfl:  .  omeprazole (PRILOSEC) 20 MG capsule, Take 1 capsule (20 mg total) by mouth daily., Disp: 30 capsule, Rfl: 11 .  diclofenac (VOLTAREN) 75 MG EC tablet, TAKE 1 TABLET BY MOUTH TWICE DAILY (Patient not taking: Reported on 08/22/2017), Disp: 30 tablet, Rfl: 0 .  sertraline (ZOLOFT) 50 MG tablet, Take 1 tablet (50 mg total) by mouth daily. (Patient not taking: Reported on 10/13/2017), Disp: 90 tablet, Rfl: 1  Review of Systems Constitutional: negative Gastrointestinal: negative Genitourinary: negative  Objective:  BP 140/80   Pulse 92   Wt 166 lb 3.2 oz (75.4 kg)   LMP 10/08/2017   BMI 28.53 kg/m    BMI: Body mass index is 28.53 kg/m.  General Appearance: Alert, appropriate appearance for age. No acute distress HEENT: Grossly normal Breast Exam: No masses or nodes.No dimpling, nipple retraction or discharge. Gastrointestinal: Soft, non-tender, no masses or organomegaly Tummy tuck scars healing well Pelvic Exam:   External genitalia: normal general appearance Vaginal: normal mucosa without prolapse or lesions, normal without tenderness, induration or masses and normal rugae Cervix: normal appearance Adnexa: normal bimanual exam Uterus: normal single, nontender  Pap taken  Rectovaginal: not indicated Lymphatic Exam: Non-palpable nodes in neck, clavicular, axillary, or inguinal regions Skin: no rash  or abnormalities Neurologic: Normal gait and speech, no tremor  Psychiatric: Alert and oriented, appropriate affect.  Urinalysis:Not done  Mallory Shirk. MD Pgr [phone] 12:26 PM    By signing my name below, I, Izna Ahmed, attest that this documentation has been prepared under the direction and in the presence of Jonnie Kind, MD. Electronically Signed: Jabier Gauss, Medical Scribe. 10/13/17. 12:26 PM.  I personally performed  the services described in this documentation, which was SCRIBED in my presence. The recorded information has been reviewed and considered accurate. It has been edited as necessary during review. Jonnie Kind, MD

## 2017-10-14 LAB — COMPREHENSIVE METABOLIC PANEL
ALK PHOS: 79 IU/L (ref 39–117)
ALT: 10 IU/L (ref 0–32)
AST: 17 IU/L (ref 0–40)
Albumin/Globulin Ratio: 1.7 (ref 1.2–2.2)
Albumin: 4.6 g/dL (ref 3.5–5.5)
BUN/Creatinine Ratio: 21 (ref 9–23)
BUN: 13 mg/dL (ref 6–20)
Bilirubin Total: 0.5 mg/dL (ref 0.0–1.2)
CALCIUM: 9.1 mg/dL (ref 8.7–10.2)
CO2: 23 mmol/L (ref 20–29)
CREATININE: 0.63 mg/dL (ref 0.57–1.00)
Chloride: 103 mmol/L (ref 96–106)
GFR calc Af Amer: 137 mL/min/{1.73_m2} (ref >59)
GFR, EST NON AFRICAN AMERICAN: 119 mL/min/{1.73_m2} (ref >59)
GLOBULIN, TOTAL: 2.7 g/dL (ref 1.5–4.5)
GLUCOSE: 84 mg/dL (ref 65–99)
Potassium: 4.2 mmol/L (ref 3.5–5.2)
Sodium: 140 mmol/L (ref 134–144)
Total Protein: 7.3 g/dL (ref 6.0–8.5)

## 2017-10-14 LAB — CBC
HEMATOCRIT: 43.7 % (ref 34.0–46.6)
Hemoglobin: 14.3 g/dL (ref 11.1–15.9)
MCH: 30.6 pg (ref 26.6–33.0)
MCHC: 32.7 g/dL (ref 31.5–35.7)
MCV: 94 fL (ref 79–97)
Platelets: 216 10*3/uL (ref 150–379)
RBC: 4.67 x10E6/uL (ref 3.77–5.28)
RDW: 12.9 % (ref 12.3–15.4)
WBC: 6.8 10*3/uL (ref 3.4–10.8)

## 2017-10-14 LAB — LIPID PANEL
CHOLESTEROL TOTAL: 147 mg/dL (ref 100–199)
Chol/HDL Ratio: 2.5 ratio (ref 0.0–4.4)
HDL: 59 mg/dL (ref >39)
LDL CALC: 65 mg/dL (ref 0–99)
Triglycerides: 116 mg/dL (ref 0–149)
VLDL CHOLESTEROL CAL: 23 mg/dL (ref 5–40)

## 2017-10-14 LAB — TSH: TSH: 0.948 u[IU]/mL (ref 0.450–4.500)

## 2017-10-15 ENCOUNTER — Other Ambulatory Visit: Payer: 59 | Admitting: Obstetrics and Gynecology

## 2017-10-15 LAB — CYTOLOGY - PAP
DIAGNOSIS: NEGATIVE
HPV (WINDOPATH): NOT DETECTED

## 2017-10-30 ENCOUNTER — Other Ambulatory Visit: Payer: Self-pay | Admitting: Nurse Practitioner

## 2017-10-31 ENCOUNTER — Telehealth: Payer: Self-pay | Admitting: Nurse Practitioner

## 2017-10-31 ENCOUNTER — Other Ambulatory Visit: Payer: Self-pay | Admitting: *Deleted

## 2017-10-31 MED ORDER — DICLOFENAC SODIUM 75 MG PO TBEC: 75.0000 mg | DELAYED_RELEASE_TABLET | Freq: Two times a day (BID) | ORAL | 2 refills | Status: DC

## 2017-10-31 MED FILL — DICLOFENAC SOD 75 MG TAB EC: 75 | 30 days supply | Qty: 60 | Fill #0

## 2017-10-31 NOTE — Telephone Encounter (Signed)
Requesting Rx for Diclofenac to [facility].  She said she is having a flare up with her hip pain and wants this done ASAP.

## 2017-10-31 NOTE — Telephone Encounter (Signed)
Med sent to pharm. Pt notified.  

## 2017-10-31 NOTE — Telephone Encounter (Signed)
Diclofenac 75 mg 1 twice daily #60 with 2 refills may send this in

## 2017-11-13 ENCOUNTER — Encounter (HOSPITAL_COMMUNITY): Payer: Self-pay | Admitting: Physical Therapy

## 2017-11-13 NOTE — Therapy (Signed)
[place], [place], [address] Phone: [phone]   Fax:  [phone]  Patient Details  Name: Marissa Burnett MRN: [id number] Date of Birth: October 25, 1986 Referring Provider:  No ref. provider found  Encounter Date: 11/13/2017   PHYSICAL THERAPY DISCHARGE SUMMARY  Visits from Start of Care: 1  Current functional level related to goals / functional outcomes: Patient did not return after initial eval; DC.    Remaining deficits: Unable to assess    Education / Equipment: None  Plan: Patient agrees to discharge.  Patient goals were not met. Patient is being discharged due to not returning since the last visit.  ?????    Deniece Ree PT, DPT, CBIS  Supplemental Physical Therapist [facility] [address] [place], [place], [address] Phone: [phone]   Fax:  [phone]

## 2017-12-12 ENCOUNTER — Encounter: Payer: Self-pay | Admitting: Nurse Practitioner

## 2017-12-12 ENCOUNTER — Encounter: Payer: Self-pay | Admitting: Family Medicine

## 2017-12-24 ENCOUNTER — Other Ambulatory Visit: Payer: Self-pay | Admitting: Adult Health

## 2017-12-25 MED FILL — OMEPRAZOLE 20 MG CAP: 20 | 30 days supply | Qty: 30 | Fill #0

## 2018-01-08 ENCOUNTER — Ambulatory Visit (INDEPENDENT_AMBULATORY_CARE_PROVIDER_SITE_OTHER): Payer: No Typology Code available for payment source | Admitting: Family Medicine

## 2018-01-08 ENCOUNTER — Ambulatory Visit (HOSPITAL_COMMUNITY)
Admission: RE | Admit: 2018-01-08 | Discharge: 2018-01-08 | Disposition: A | Discharge status: Home / Self Care | Payer: No Typology Code available for payment source | Source: Ambulatory Visit | Attending: Family Medicine | Admitting: Family Medicine

## 2018-01-08 ENCOUNTER — Encounter: Payer: Self-pay | Admitting: Family Medicine

## 2018-01-08 VITALS — BP 110/82 | Ht 64.0 in | Wt 170.0 lb

## 2018-01-08 DIAGNOSIS — Z91018 Allergy to other foods: Secondary | ICD-10-CM

## 2018-01-08 DIAGNOSIS — M25552 Pain in left hip: Secondary | ICD-10-CM | POA: Insufficient documentation

## 2018-01-08 DIAGNOSIS — M545 Low back pain, unspecified: Secondary | ICD-10-CM

## 2018-01-08 DIAGNOSIS — E663 Overweight: Secondary | ICD-10-CM | POA: Diagnosis not present

## 2018-01-08 MED ORDER — PHENTERMINE HCL 37.5 MG PO CAPS
37.5000 mg | ORAL_CAPSULE | ORAL | 2 refills | Status: DC
Start: 2018-01-08 — End: 2018-04-14

## 2018-01-08 NOTE — Progress Notes (Signed)
   Subjective:    Patient ID: Marissa Burnett, female    DOB: 26-May-1986, 32 y.o.   MRN: [id number]  HPI Patient is here today with complaints of left side hip pain.Thinks need referral to orthopedic and allergy specialist. 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  She also has allergy issues she is allergic to peanuts but the other day she was eaten seafood and she had similar symptoms she already uses and has a prescription for epinephrine  She has the new [facility] focus plan and needs to be coordinated through [place].   Review of Systems  Constitutional: Negative for activity change, fatigue and fever.  HENT: Negative for congestion.   Respiratory: Negative for cough, chest tightness and shortness of breath.   Cardiovascular: Negative for chest pain and leg swelling.  Gastrointestinal: Negative for abdominal pain.  Musculoskeletal: Positive for back pain.  Skin: Negative for color change.  Neurological: Negative for headaches.  Psychiatric/Behavioral: Negative for behavioral problems.       Objective:   Physical Exam  Constitutional: She appears well-developed and well-nourished. No distress.  HENT:  Head: Normocephalic and atraumatic.  Eyes: Right eye exhibits no discharge. Left eye exhibits no discharge.  Neck: No tracheal deviation present.  Cardiovascular: Normal rate, regular rhythm and normal heart sounds.  No murmur heard. Pulmonary/Chest: Effort normal and breath sounds normal. No respiratory distress. She has no wheezes. She has no rales.  Musculoskeletal: She exhibits no  edema.  Lymphadenopathy:    She has no cervical adenopathy.  Neurological: She is alert. She exhibits normal muscle tone.  Skin: Skin is warm and dry. No erythema.  Psychiatric: Her behavior is normal.  Vitals reviewed.  Subjective discomfort lower back on the left side negative straight leg raise good range of motion of the hip reflexes good strength good  25 minutes was spent with the patient. Greater than half the time was spent in discussion and answering questions and counseling regarding the issues that the patient came in for today.  Patient also with moderate obesity issues she is trying to watch her diet she was requesting Adipex to help curb her diet and curb appetite she is use this before she knows it is not for long-term use     Assessment & Plan:  Food allergy referral to allergist for further evaluation she Artie has epinephrine  Left hip pain this is possible IT band syndrome recommend stretching exercises orthopedic referral  Low back pain persistent orthopedic referral x-rays indicated may be a facet joint problem I doubt severe sciatica issue could potentially need injections  Healthy diet recommended regular physical activity recommended Adipex for the next 3 months not for long-term use if any side effects stop medicine

## 2018-01-08 NOTE — Patient Instructions (Signed)

## 2018-01-30 ENCOUNTER — Telehealth: Payer: Self-pay | Admitting: Family Medicine

## 2018-01-30 MED ORDER — CHLORZOXAZONE 500 MG PO TABS: ORAL_TABLET | ORAL | 2 refills | Status: DC

## 2018-01-30 NOTE — Telephone Encounter (Signed)
Patient states still having back pain is requesting muscle relaxer to help with pain no pain medication has MRI in a few days. This was set up by her specialist but having a hard time at work because she has to stand for long periods at a time. [facility]

## 2018-01-30 NOTE — Telephone Encounter (Signed)
Parafon forte, 1 3 times daily as needed muscle spasms, caution drowsiness, #21, 2 refills

## 2018-01-30 NOTE — Telephone Encounter (Signed)
Rx sent to the pharmacy, pt is aware.

## 2018-02-09 DIAGNOSIS — M9904 Segmental and somatic dysfunction of sacral region: Secondary | ICD-10-CM | POA: Insufficient documentation

## 2018-02-10 ENCOUNTER — Ambulatory Visit (INDEPENDENT_AMBULATORY_CARE_PROVIDER_SITE_OTHER): Payer: No Typology Code available for payment source | Admitting: Family Medicine

## 2018-02-10 ENCOUNTER — Encounter: Payer: Self-pay | Admitting: Family Medicine

## 2018-02-10 VITALS — BP 132/84 | Temp 97.9°F | Ht 64.0 in

## 2018-02-10 DIAGNOSIS — J111 Influenza due to unidentified influenza virus with other respiratory manifestations: Secondary | ICD-10-CM

## 2018-02-10 DIAGNOSIS — J029 Acute pharyngitis, unspecified: Secondary | ICD-10-CM

## 2018-02-10 LAB — POCT RAPID STREP A (OFFICE): Rapid Strep A Screen: NEGATIVE

## 2018-02-10 MED ORDER — OSELTAMIVIR PHOSPHATE 75 MG PO CAPS: 75.0000 mg | ORAL_CAPSULE | Freq: Two times a day (BID) | ORAL | 0 refills | Status: DC

## 2018-02-10 NOTE — Progress Notes (Signed)
Subjective:    Patient ID: Marissa Burnett, female    DOB: 1986-11-25, 32 y.o.   MRN: [id number]  Sore Throat   The current episode started yesterday. Associated symptoms comments: Runny nose and fatigue. Treatments tried: ibuprofen. The treatment provided mild relief.   Sore throat runny nose  Sudden onset   Then cholls    Throat aching bad, hurting bad, felt bad, had to call in towork  No fever   Energy pk but not great  Appetite ok      Cong and dranage and mucus prodcutio with some c0ugh   Felt ok two d ago    Results for orders placed or performed in visit on 10/13/17  CBC  Result Value Ref Range   WBC 6.8 3.4 - 10.8 x10E3/uL   RBC 4.67 3.77 - 5.28 x10E6/uL   Hemoglobin 14.3 11.1 - 15.9 g/dL   Hematocrit 43.7 34.0 - 46.6 %   MCV 94 79 - 97 fL   MCH 30.6 26.6 - 33.0 pg   MCHC 32.7 31.5 - 35.7 g/dL   RDW 12.9 12.3 - 15.4 %   Platelets 216 150 - 379 x10E3/uL  Comp Met (CMET)  Result Value Ref Range   Glucose 84 65 - 99 mg/dL   BUN 13 6 - 20 mg/dL   Creatinine, Ser 0.63 0.57 - 1.00 mg/dL   GFR calc non Af Amer 119 >59 mL/min/1.73   GFR calc Af Amer 137 >59 mL/min/1.73   BUN/Creatinine Ratio 21 9 - 23   Sodium 140 134 - 144 mmol/L   Potassium 4.2 3.5 - 5.2 mmol/L   Chloride 103 96 - 106 mmol/L   CO2 23 20 - 29 mmol/L   Calcium 9.1 8.7 - 10.2 mg/dL   Total Protein 7.3 6.0 - 8.5 g/dL   Albumin 4.6 3.5 - 5.5 g/dL   Globulin, Total 2.7 1.5 - 4.5 g/dL   Albumin/Globulin Ratio 1.7 1.2 - 2.2   Bilirubin Total 0.5 0.0 - 1.2 mg/dL   Alkaline Phosphatase 79 39 - 117 IU/L   AST 17 0 - 40 IU/L   ALT 10 0 - 32 IU/L  TSH  Result Value Ref Range   TSH 0.948 0.450 - 4.500 uIU/mL  Lipid Profile  Result Value Ref Range   Cholesterol, Total 147 100 - 199 mg/dL   Triglycerides 116 0 - 149 mg/dL   HDL 59 >39 mg/dL   VLDL Cholesterol Cal 23 5 - 40 mg/dL   LDL Calculated 65 0 - 99 mg/dL   Chol/HDL Ratio 2.5 0.0 - 4.4 ratio  Cytology - PAP  Result Value Ref Range    Adequacy      Satisfactory for evaluation  endocervical/transformation zone component PRESENT.   Diagnosis      NEGATIVE FOR INTRAEPITHELIAL LESIONS OR MALIGNANCY.   HPV NOT DETECTED    Material Submitted CervicoVaginal Pap [ThinPrep Imaged]    CYTOLOGY - PAP PAP RESULT       Review of Systems No headache, no major weight loss or weight gain, no chest pain no back pain abdominal pain no change in bowel habits complete ROS otherwise negative     Objective:   Physical Exam Alert vitals reviewed, moderate malaise. Hydration good. Positive nasal congestion lungs no crackles or wheezes, no tachypnea, intermittent bronchial cough during exam heart regular rate and rhythm.        Assessment & Plan:  Impression influenza discussed at length. Petra Kuba of illness and potential sequela discussed.  Plan Tamiflu prescribed if indicated and timing appropriate. Symptom care discussed. Warning signs discussed. WSL

## 2018-02-11 ENCOUNTER — Other Ambulatory Visit: Payer: Self-pay | Admitting: Orthopedic Surgery

## 2018-02-11 DIAGNOSIS — M259 Joint disorder, unspecified: Secondary | ICD-10-CM

## 2018-02-11 LAB — SPECIMEN STATUS REPORT

## 2018-02-11 LAB — STREP A DNA PROBE: STREP GP A DIRECT, DNA PROBE: NEGATIVE

## 2018-02-12 ENCOUNTER — Telehealth: Payer: Self-pay | Admitting: Family Medicine

## 2018-02-12 ENCOUNTER — Other Ambulatory Visit: Payer: Self-pay | Admitting: *Deleted

## 2018-02-12 MED ORDER — AMOXICILLIN 500 MG PO CAPS: 500.0000 mg | ORAL_CAPSULE | Freq: Three times a day (TID) | ORAL | 0 refills | Status: DC

## 2018-02-12 NOTE — Telephone Encounter (Signed)
Patient was diagnosed with the flu on 02/10/18 by Dr. Richardson Landry.  She said she is having a lot of congestion and is concerned that she may not have the flu.  She wants to know if an antibiotic can be called in?  [facility]

## 2018-02-12 NOTE — Telephone Encounter (Signed)
It is not unusual for the flu to trigger a secondary infection potentially sinus infections etc.  I would recommend amoxicillin 500 mg 1 3 times daily for 10 days if ongoing troubles or if worse follow-up

## 2018-02-12 NOTE — Telephone Encounter (Signed)
Discussed with pt. Pt verbalized understanding. Med sent to pharm.  

## 2018-02-12 NOTE — Telephone Encounter (Signed)
No fever, trouble breathing through her nose due to congestion. Congestion all in he head. Clear to yellow now. It was green in the beginning. Has not had a fever or bodyaches, slight cough. Does not think she had the flu. She worked yesterday and did housework all day today. She states if she had the flu she doesn't think she could have done all of that. Requesting an antibiotic.

## 2018-02-26 ENCOUNTER — Ambulatory Visit: Payer: No Typology Code available for payment source | Admitting: Allergy & Immunology

## 2018-03-03 ENCOUNTER — Inpatient Hospital Stay
Admission: RE | Admit: 2018-03-03 | Discharge: 2018-03-03 | Disposition: A | Discharge status: Home / Self Care | Payer: Self-pay | Source: Ambulatory Visit | Attending: Orthopedic Surgery | Admitting: Orthopedic Surgery

## 2018-03-03 ENCOUNTER — Other Ambulatory Visit: Payer: Self-pay

## 2018-03-31 ENCOUNTER — Ambulatory Visit
Admission: RE | Admit: 2018-03-31 | Discharge: 2018-03-31 | Disposition: A | Discharge status: Home / Self Care | Payer: No Typology Code available for payment source | Source: Ambulatory Visit | Attending: Orthopedic Surgery | Admitting: Orthopedic Surgery

## 2018-03-31 DIAGNOSIS — M259 Joint disorder, unspecified: Secondary | ICD-10-CM

## 2018-04-14 ENCOUNTER — Encounter: Payer: Self-pay | Admitting: Allergy & Immunology

## 2018-04-14 ENCOUNTER — Ambulatory Visit: Payer: No Typology Code available for payment source | Admitting: Allergy & Immunology

## 2018-04-14 VITALS — BP 116/64 | HR 84 | Temp 98.4°F | Resp 18 | Ht 64.0 in

## 2018-04-14 DIAGNOSIS — L233 Allergic contact dermatitis due to drugs in contact with skin: Secondary | ICD-10-CM | POA: Diagnosis not present

## 2018-04-14 DIAGNOSIS — J302 Other seasonal allergic rhinitis: Secondary | ICD-10-CM | POA: Diagnosis not present

## 2018-04-14 DIAGNOSIS — J3089 Other allergic rhinitis: Secondary | ICD-10-CM | POA: Diagnosis not present

## 2018-04-14 DIAGNOSIS — T7800XD Anaphylactic reaction due to unspecified food, subsequent encounter: Secondary | ICD-10-CM

## 2018-04-14 NOTE — Patient Instructions (Addendum)
1. Anaphylactic shock due to food - Continue to avoid peanuts and shellfish for now. - We will get blood testing to further characterize your allergic triggers. - We will call you in 1-2 weeks with the results of the testing.  - EpiPen is up to date.   2. Seasonal and perennial allergic rhinitis (molds, dust mites, cock roach) - Continue with over the counter medications as needed.  - Claritin is one of the weaker ones, but if it works for you that is fine.   3. Return in about 1 year (around 04/15/2019).   Please inform us of any Emergency Department visits, hospitalizations, or changes in symptoms. Call us before going to the ED for breathing or allergy symptoms since we might be able to fit you in for a sick visit. Feel free to contact us anytime with any questions, problems, or concerns.  It was a pleasure to meet you today!  Websites that have reliable patient information: 1. American Academy of Asthma, Allergy, and Immunology: www.aaaai.org 2. Food Allergy Research and Education (FARE): foodallergy.org 3. Mothers of Asthmatics: http://www.asthmacommunitynetwork.org 4. American College of Allergy, Asthma, and Immunology: www.acaai.org

## 2018-04-14 NOTE — Progress Notes (Signed)
FOLLOW UP  Date of Service/Encounter:  04/14/18   Assessment:   Anaphylactic shock due to food (peanuts, ? shellfish)  Seasonal and perennial allergic rhinitis  Allergic contact dermatitis due to drugs in contact with skin  Plan/Recommendations:   1. Anaphylactic shock due to food - Continue to avoid peanuts and shellfish for now. - We will get blood testing to further characterize your allergic triggers. - We will call you in 1-2 weeks with the results of the testing.  - EpiPen is up to date.   2. Seasonal and perennial allergic rhinitis (molds, dust mites, cock roach) - Continue with over the counter medications as needed.  - Claritin is one of the weaker ones, but if it works for you that is fine.   3. Contact dermatitis - likely neosporin - I recommended avoiding neosporin after the next treatment to see if this helps. - This is a well known sensitizer. - The rash/blister certainly looks consistent with contact dermatitis.   4. Return in about 1 year (around 04/15/2019).   Subjective:   Marissa Burnett is a 32 y.o. female presenting today for follow up of  Chief Complaint  Patient presents with  . Allergic Reaction    shellfish caused throat swelling. 50 mg benadryl helped. she had another reaction to a chocolate chip cookie, believes cross contamination with peanut.     Marissa Burnett has a history of the following: Patient Active Problem List   Diagnosis Date Noted  . CIN III (cervical intraepithelial neoplasia grade III) with severe dysplasia 11/18/2016  . Abnormal Papanicolaou smear of cervix 10/15/2016  . Urticaria 03/16/2016  . Morbid obesity ([place]) 07/04/2015  . Anxiety 06/30/2015  . ADD (attention deficit disorder) 07/20/2014    History obtained from: chart review and patient.  Marissa Burnett Primary Care Provider is Marissa Drown, MD.     Marissa Burnett is a 32 y.o. female presenting for a follow up visit. She was last seen in February 2017 by  Marissa Burnett, who is no longer with the practice. At that time, she was continued on cetirizine 10mg  up to twice daily with ranitidine added with persisting hives. She was first seen in DEcember 2016. At that time, she was having symptoms of allergic rhinitis and peanut allergy. Testing demonstrated mild reactivity to molds, dust mite, cockroach, as well as peanut. Tree nut testing was equivocal.    Since the last visit, she has mostly done well. She is now worried about shellfish. She eats a lot of shellfish with her husband. A few minutes after she ate last time, she started having problems with throat "swelling". She also has anxiety and this might have made it worse. She took two benadryl and chugged a lot of water. This was worse than any peanut allergy that she has ever had. She even got her EpiPen out and taught her husband how to use. This was at the [employer] in [address]. There was nothing out of the ordinary.   She does continue to avoid peanuts due to a similar reaction. This developed later in life. She did have a cookie one month ago and from that she developed a reaction. She is unsure whether there was cross contamination. She also had a reaction to [employer].   She also reports that she has having a tattoo removed. She suddenly she developed hives when she gets the laser treatments, up to twice annually. She actually is getting large blisters from  the treatments, which are quite impressive per the pictures that she shows me today. She has not brought this up with her dermatologist as of yet. She does use Neosporin on the site after the treatments.   Otherwise, there have been no changes to her past medical history, surgical history, family history, or social history.    Review of Systems: a 14-point review of systems is pertinent for what is mentioned in HPI.  Otherwise, all other systems were negative. Constitutional: negative other than that listed in the HPI Eyes:  negative other than that listed in the HPI Ears, nose, mouth, throat, and face: negative other than that listed in the HPI Respiratory: negative other than that listed in the HPI Cardiovascular: negative other than that listed in the HPI Gastrointestinal: negative other than that listed in the HPI Genitourinary: negative other than that listed in the HPI Integument: negative other than that listed in the HPI Hematologic: negative other than that listed in the HPI Musculoskeletal: negative other than that listed in the HPI Neurological: negative other than that listed in the HPI Allergy/Immunologic: negative other than that listed in the HPI    Objective:   Blood pressure 116/64, pulse 84, temperature 98.4 F (36.9 C), temperature source Oral, resp. rate 18, height 5\' 4"  (1.626 m), SpO2 98 %. Body mass index is 29.18 kg/m.   Physical Exam:  General: Alert, interactive, in no acute distress. Talkative.  Eyes: No conjunctival injection bilaterally, no discharge on the right, no discharge on the left and no Horner-Trantas dots present. PERRL bilaterally. EOMI without pain. No photophobia.  Ears: Right TM pearly gray with normal light reflex, Left TM pearly gray with normal light reflex, Right TM intact without perforation and Left TM intact without perforation.  Nose/Throat: External nose within normal limits and septum midline. Turbinates edematous and pale with clear discharge. Posterior oropharynx erythematous without cobblestoning in the posterior oropharynx. Tonsils 2+ without exudates.  Tongue without thrush. Lungs: Clear to auscultation without wheezing, rhonchi or rales. No increased work of breathing. CV: Normal S1/S2. No murmurs. Capillary refill <2 seconds.  Skin: Warm and dry, without lesions or rashes. Neuro:   Grossly intact. No focal deficits appreciated. Responsive to questions.  Diagnostic studies: none   Marissa Marvel, MD [facility] [place]

## 2018-04-20 LAB — IGE PEANUT COMPONENT PROFILE
F352-IgE Ara h 8: <0.1 kU/L
F422-IgE Ara h 1: <0.1 kU/L
F423-IgE Ara h 2: <0.1 kU/L
F424-IgE Ara h 3: <0.1 kU/L
F427-IgE Ara h 9: <0.1 kU/L
F447-IgE Ara h 6: <0.1 kU/L

## 2018-04-20 LAB — ALLERGY PANEL 18, NUT MIX GROUP
Allergen Coconut IgE: <0.1 kU/L
Peanut IgE: <0.1 kU/L
Sesame Seed IgE: 0.77 kU/L — AB

## 2018-04-21 LAB — ALLERGEN PROFILE, SHELLFISH
Clam IgE: <0.1 kU/L
F080-IgE Lobster: <0.1 kU/L
F290-IgE Oyster: <0.1 kU/L
Scallop IgE: <0.1 kU/L

## 2018-04-24 NOTE — Progress Notes (Signed)
Spoke to patient to set up seafood challenge Patient was unsure of what she wanted to be tested for due to the fact that she had eaten so much and various seafood and shellfish. She wants to think on the food challenge and call back to schedule if she feels that she needs it Patient may or may not eat seafood/shellfish in the future, but only eat a small amount of one item to help herself narrow down what may have been the cause of her issues  Patient is aware she can call back and schedule a future challenge, or with any questions she may have.

## 2018-05-26 MED FILL — OMEPRAZOLE 20 MG CAP: 20 | 30 days supply | Qty: 30 | Fill #1

## 2018-06-19 ENCOUNTER — Telehealth: Payer: Self-pay | Admitting: Family Medicine

## 2018-06-19 DIAGNOSIS — M545 Low back pain, unspecified: Secondary | ICD-10-CM

## 2018-06-19 NOTE — Telephone Encounter (Signed)
Referral placed in epic.I called and left a detailed message asked that she would please call our office to confirm she received the message.

## 2018-06-19 NOTE — Telephone Encounter (Signed)
Pt requesting referral to chiropractor for her lumbar pain Dr. Lovena Le her in [place] (if he's in [facility] network) or [place]  (new [facility] plan requires a referral)  Please advise  (let pt know when referral is in system so she can schedule her appt - OK to [facility])

## 2018-06-19 NOTE — Telephone Encounter (Signed)
May have referral to Dr. Lovena Le

## 2018-06-19 NOTE — Telephone Encounter (Signed)
Patient is aware per Estill Bamberg she called and told her to let me know she was aware of all.

## 2018-06-30 MED FILL — OMEPRAZOLE 20 MG CAP: 20 | 30 days supply | Qty: 30 | Fill #2

## 2018-07-13 ENCOUNTER — Other Ambulatory Visit: Payer: Self-pay | Admitting: Family Medicine

## 2018-07-15 ENCOUNTER — Telehealth: Payer: Self-pay | Admitting: Family Medicine

## 2018-07-15 ENCOUNTER — Other Ambulatory Visit: Payer: Self-pay | Admitting: Family Medicine

## 2018-07-15 NOTE — Telephone Encounter (Signed)
Contacted patient and informed her that she needs a office visit. Pt stated that she would call back to get the office visit.

## 2018-07-15 NOTE — Telephone Encounter (Signed)
The patient may have 1 refill will need follow-up office visit because of being controlled medicine if she wants to have additional refills

## 2018-07-15 NOTE — Telephone Encounter (Signed)
I agree this is a controlled medication we are held to state laws regarding this therefore needs to be seen before giving out this prescription

## 2018-07-15 NOTE — Telephone Encounter (Signed)
Fax from pharmacy requesting refill on Phentermine 37.5MG . Last filled 01/08/2018. Comments on medication list stated no refills until she make appt at office.

## 2018-07-20 ENCOUNTER — Telehealth: Payer: Self-pay | Admitting: Family Medicine

## 2018-07-20 DIAGNOSIS — M549 Dorsalgia, unspecified: Secondary | ICD-10-CM

## 2018-07-20 NOTE — Telephone Encounter (Signed)
Left message to return call 

## 2018-07-20 NOTE — Telephone Encounter (Signed)
Discussed with pt. Pt wants to see neurosurgeon.

## 2018-07-20 NOTE — Telephone Encounter (Signed)
Locally within the [place] region there are not other orthopedists who handled the back-more than likely would require referral to [facility] or another option would be going ahead with neurosurgical consultation in [place] which I think would be reasonable-because they do handle a lot of back issues

## 2018-07-20 NOTE — Telephone Encounter (Signed)
Patient was referred to Dr. Rolena Infante due to her back pain with [facility] in January 2019.  She isn't confident with his treatment and would like to be referred elsewhere.  She wants to know if she should see a neurosurgeon or if she should see a different orthopedic.

## 2018-07-20 NOTE — Telephone Encounter (Signed)
Tried to call no answer unable to leave message.

## 2018-07-29 ENCOUNTER — Encounter (INDEPENDENT_AMBULATORY_CARE_PROVIDER_SITE_OTHER): Payer: Self-pay

## 2018-08-12 MED FILL — OMEPRAZOLE 20 MG CAP: 20 | 30 days supply | Qty: 30 | Fill #3

## 2018-08-31 ENCOUNTER — Telehealth: Payer: No Typology Code available for payment source | Admitting: Family

## 2018-08-31 DIAGNOSIS — L089 Local infection of the skin and subcutaneous tissue, unspecified: Secondary | ICD-10-CM

## 2018-08-31 MED ORDER — MUPIROCIN 2 % EX OINT: 1.0000 "application " | TOPICAL_OINTMENT | Freq: Two times a day (BID) | CUTANEOUS | 0 refills | Status: DC

## 2018-08-31 NOTE — Progress Notes (Signed)
E Visit for Rash  We are sorry that you are not feeling well. Here is how we plan to help!  Based upon what you have shared with me it looks like you have a skin infection. This is caused by a superficial infection of the skin and is treated with an antibiotic. I have prescribed: and Topical mupiricin. Please make sure your TDAP is up to date. Usually you receive this during pregnancy, so yours should be current.   Please keep clean and dry and report any increased signs or symptoms of infection such as redness, tenderness, discharge, or fevers.     HOME CARE:   Take cool showers and avoid direct sunlight.  Apply cool compress or wet dressings.  Take a bath in an oatmeal bath.  Sprinkle content of one Aveeno packet under running faucet with comfortably warm water.  Bathe for 15-20 minutes, 1-2 times daily.  Pat dry with a towel. Do not rub the rash.  Use hydrocortisone cream.  Take an antihistamine like Benadryl for widespread rashes that itch.  The adult dose of Benadryl is 25-50 mg by mouth 4 times daily.  Caution:  This type of medication may cause sleepiness.  Do not drink alcohol, drive, or operate dangerous machinery while taking antihistamines.  Do not take these medications if you have prostate enlargement.  Read package instructions thoroughly on all medications that you take.  GET HELP RIGHT AWAY IF:   Symptoms don't go away after treatment.  Severe itching that persists.  If you rash spreads or swells.  If you rash begins to smell.  If it blisters and opens or develops a yellow-brown crust.  You develop a fever.  You have a sore throat.  You become short of breath.  MAKE SURE YOU:  Understand these instructions. Will watch your condition. Will get help right away if you are not doing well or get worse.  Thank you for choosing an e-visit. Your e-visit answers were reviewed by a board certified advanced clinical practitioner to complete your personal care  plan. Depending upon the condition, your plan could have included both over the counter or prescription medications. Please review your pharmacy choice. Be sure that the pharmacy you have chosen is open so that you can pick up your prescription now.  If there is a problem you may message your provider in [place] to have the prescription routed to another pharmacy. Your safety is important to [place]. If you have drug allergies check your prescription carefully.  For the next 24 hours, you can use MyChart to ask questions about today's visit, request a non-urgent call back, or ask for a work or school excuse from your e-visit provider. You will get an email in the next two days asking about your experience. I hope that your e-visit has been valuable and will speed your recovery.

## 2018-09-27 ENCOUNTER — Encounter: Payer: Self-pay | Admitting: Family Medicine

## 2018-09-27 ENCOUNTER — Telehealth: Payer: Self-pay | Admitting: Family Medicine

## 2018-09-27 DIAGNOSIS — G8929 Other chronic pain: Secondary | ICD-10-CM | POA: Insufficient documentation

## 2018-09-27 DIAGNOSIS — M545 Other chronic pain: Secondary | ICD-10-CM

## 2018-09-27 HISTORY — DX: Other chronic pain: M54.50

## 2018-09-27 NOTE — Telephone Encounter (Signed)
Neurosurgery Dr. Arnoldo Morale saw the patient they do not feel they can help her surgically they are recommending injections with pain medicine patient will consider it

## 2018-10-06 MED FILL — OMEPRAZOLE 20 MG CPDR: 20 | 30 days supply | Qty: 30 | Fill #4

## 2018-10-14 ENCOUNTER — Other Ambulatory Visit (HOSPITAL_COMMUNITY)
Admission: RE | Admit: 2018-10-14 | Discharge: 2018-10-14 | Disposition: A | Discharge status: Home / Self Care | Payer: No Typology Code available for payment source | Source: Ambulatory Visit | Attending: Obstetrics and Gynecology | Admitting: Obstetrics and Gynecology

## 2018-10-14 ENCOUNTER — Ambulatory Visit (INDEPENDENT_AMBULATORY_CARE_PROVIDER_SITE_OTHER): Payer: No Typology Code available for payment source | Admitting: Obstetrics and Gynecology

## 2018-10-14 ENCOUNTER — Encounter: Payer: Self-pay | Admitting: Obstetrics and Gynecology

## 2018-10-14 ENCOUNTER — Other Ambulatory Visit: Payer: Self-pay

## 2018-10-14 VITALS — BP 114/70 | HR 87 | Ht 64.0 in | Wt 169.6 lb

## 2018-10-14 DIAGNOSIS — R87613 High grade squamous intraepithelial lesion on cytologic smear of cervix (HGSIL): Secondary | ICD-10-CM

## 2018-10-14 DIAGNOSIS — D069 Carcinoma in situ of cervix, unspecified: Secondary | ICD-10-CM

## 2018-10-14 DIAGNOSIS — Z01419 Encounter for gynecological examination (general) (routine) without abnormal findings: Secondary | ICD-10-CM | POA: Diagnosis present

## 2018-10-14 MED ORDER — PHENTERMINE HCL 30 MG PO CAPS: 30.0000 mg | ORAL_CAPSULE | ORAL | 0 refills | Status: DC

## 2018-10-14 NOTE — Progress Notes (Signed)
Patient ID: [name], female   DOB: 06/02/1986, 32 y.o.   MRN: [id number]  Assessment:  Annual Gyn Exam History of CIN 3 status post LEEP with clear margins Chronic back pain worsened with weight gain Plan:  1. pap smear done, next pap due q. 1 year x20 years 2. return annually or prn 3    Annual mammogram advised after age 40 4.  Trial of phentermine 30 mg daily x30 days to see if weight loss improves back pain, also to add yoga and stretching exercises to activities of life. 5.  Patient will give 1 month report on back discomfort response to stretching exercises, consideration of water exercises, and weight loss. 6.  Baseline annual labs to be drawn today Subjective:  Marissa Burnett is a 32 y.o. female [phone] who presents for annual exam. No LMP recorded. The patient has complaints today of back pain. Has had MRI and CT showed nothing. Had a cousin who has endometriosis. Says she hasn't has normal period in years and has pain right before her period. Last PAP 10/13/17 normal -HPV. Had 2 children both vaginally. Is allergic to neosporin. Patient complains of chronic back pain worsened after for 12-hour shifts, has been evaluated by primary care, Dr. Sallee Lange.  Has been on phentermine in the past with weight loss and apparent improved discomfort.  She tries to lose about 10 pounds twice a year  fall and spring The following portions of the patient's history were reviewed and updated as appropriate: allergies, current medications, past family history, past medical history, past social history, past surgical history and problem list. Past Medical History:  Diagnosis Date  . Abnormal Papanicolaou smear of cervix 10/15/2016   LSIL+HPV  . Allergy   . Anxiety   . Chronic lumbar pain 09/27/2018   Patient has seen Dr. Arnoldo Morale neurosurgery September 2019-they do not feel there is anything surgical that can help-they are recommending injections with pain medicine specialist  . Dysrhythmia     history of palpitations   . GERD (gastroesophageal reflux disease)   . Heart murmur birth    Past Surgical History:  Procedure Laterality Date  . ABDOMINOPLASTY    . BREAST ENHANCEMENT SURGERY    . CERVICAL CONIZATION W/BX N/A 12/13/2016   Procedure: CONIZATION CERVIX WITH BIOPSY--cold knife conization;  Surgeon: Jonnie Kind, MD;  Location: [facility];  Service: Gynecology;  Laterality: N/A;  . CHOLECYSTECTOMY  02/2002  . ENDOSCOPIC RETROGRADE CHOLANGIOPANCREATOGRAPHY (ERCP) WITH PROPOFOL    . GALLBLADDER SURGERY    . OTHER SURGICAL HISTORY     stent in kidney during pregnancy,2002; removed in 2003     Current Outpatient Medications:  .  BIOTIN PO, Take 1 tablet by mouth daily., Disp: , Rfl:  .  chlorzoxazone (PARAFON) 500 MG tablet, TAKE 1 TABLET BY MOUTH THREE TIMES DAILY FOR MUSCLE SPASM, Disp: 21 tablet, Rfl: 2 .  diclofenac (VOLTAREN) 75 MG EC tablet, Take 1 tablet (75 mg total) by mouth 2 (two) times daily., Disp: 60 tablet, Rfl: 2 .  ibuprofen (ADVIL,MOTRIN) 200 MG tablet, Take 800 mg by mouth every 6 (six) hours as needed for mild pain., Disp: , Rfl:  .  loratadine (CLARITIN) 10 MG tablet, Take 10 mg by mouth daily., Disp: , Rfl:  .  mupirocin ointment (BACTROBAN) 2 %, Apply 1 application topically 2 (two) times daily., Disp: 30 g, Rfl: 0 .  omeprazole (PRILOSEC) 20 MG capsule, TAKE 1 CAPSULE BY MOUTH DAILY., Disp: 30 capsule, Rfl:  11 .  phentermine 37.5 MG capsule, TAKE 1 CAPSULE BY MOUTH ONCE DAILY IN THE MORNING, Disp: 30 capsule, Rfl: 0  Review of Systems Constitutional: negative Gastrointestinal: negative Genitourinary: normal  Objective:  There were no vitals taken for this visit.   BMI: There is no height or weight on file to calculate BMI.  General Appearance: Alert, appropriate appearance for age. No acute distress HEENT: Grossly normal Neck / Thyroid:  Cardiovascular: RRR; normal S1, S2, no murmur Lungs: CTA bilaterally Back: No CVAT Breast Exam:  Normal to inspection and No masses or nodes.No dimpling, nipple retraction or discharge. Gastrointestinal: Soft, non-tender, no masses or organomegaly Pelvic Exam: VAGINA: normal in appearance CERVIX: had conization, healed well, slightly open UTERUS: anterior PAP: Pap smear done today. Lymphatic Exam: Non-palpable nodes in neck, clavicular, axillary, or inguinal regions  Skin: no rash or abnormalities Neurologic: Normal gait and speech, no tremor  Psychiatric: Alert and oriented, appropriate affect.  Urinalysis:Not done  By signing my name below, I, Samul Dada, attest that this documentation has been prepared under the direction and in the presence of Jonnie Kind, MD. Electronically Signed: [place]. 10/14/18. 9:13 AM.  I personally performed the services described in this documentation, which was SCRIBED in my presence. The recorded information has been reviewed and considered accurate. It has been edited as necessary during review. Jonnie Kind, MD

## 2018-10-15 LAB — COMPREHENSIVE METABOLIC PANEL
A/G RATIO: 1.8 (ref 1.2–2.2)
ALT: 9 IU/L (ref 0–32)
AST: 11 IU/L (ref 0–40)
Albumin: 4.4 g/dL (ref 3.5–5.5)
Alkaline Phosphatase: 62 IU/L (ref 39–117)
BUN/Creatinine Ratio: 16 (ref 9–23)
BUN: 11 mg/dL (ref 6–20)
Bilirubin Total: 0.5 mg/dL (ref 0.0–1.2)
CALCIUM: 9.2 mg/dL (ref 8.7–10.2)
CO2: 20 mmol/L (ref 20–29)
Chloride: 103 mmol/L (ref 96–106)
Creatinine, Ser: 0.69 mg/dL (ref 0.57–1.00)
GFR calc Af Amer: 132 mL/min/{1.73_m2} (ref >59)
GFR, EST NON AFRICAN AMERICAN: 115 mL/min/{1.73_m2} (ref >59)
GLUCOSE: 89 mg/dL (ref 65–99)
Globulin, Total: 2.4 g/dL (ref 1.5–4.5)
POTASSIUM: 4.2 mmol/L (ref 3.5–5.2)
Sodium: 139 mmol/L (ref 134–144)
Total Protein: 6.8 g/dL (ref 6.0–8.5)

## 2018-10-15 LAB — CBC
HEMATOCRIT: 41.5 % (ref 34.0–46.6)
HEMOGLOBIN: 13.9 g/dL (ref 11.1–15.9)
MCH: 30.7 pg (ref 26.6–33.0)
MCHC: 33.5 g/dL (ref 31.5–35.7)
MCV: 92 fL (ref 79–97)
Platelets: 194 10*3/uL (ref 150–450)
RBC: 4.53 x10E6/uL (ref 3.77–5.28)
RDW: 11.9 % — AB (ref 12.3–15.4)
WBC: 5.3 10*3/uL (ref 3.4–10.8)

## 2018-10-15 LAB — LIPID PANEL
Chol/HDL Ratio: 2.3 ratio (ref 0.0–4.4)
Cholesterol, Total: 146 mg/dL (ref 100–199)
HDL: 64 mg/dL (ref >39)
LDL CALC: 69 mg/dL (ref 0–99)
Triglycerides: 65 mg/dL (ref 0–149)
VLDL Cholesterol Cal: 13 mg/dL (ref 5–40)

## 2018-10-15 LAB — TSH: TSH: 0.757 u[IU]/mL (ref 0.450–4.500)

## 2018-10-16 LAB — CYTOLOGY - PAP
Diagnosis: NEGATIVE
HPV: NOT DETECTED

## 2018-10-27 ENCOUNTER — Telehealth: Payer: Self-pay | Admitting: Family Medicine

## 2018-10-27 DIAGNOSIS — M549 Dorsalgia, unspecified: Secondary | ICD-10-CM

## 2018-10-27 NOTE — Telephone Encounter (Signed)
Pt would like to try dry needling for her back pain  [place] [phone] ([place])  Needs referral due to her [facility] plan, please initiate referral in system so I may send for scheduling & documentation for insurance

## 2018-10-28 NOTE — Telephone Encounter (Signed)
May have the referral she is asking for If having ongoing troubles I recommend following up Also if needing different specialist to be involved also recommend an office visit

## 2018-10-28 NOTE — Telephone Encounter (Signed)
Referral put in and pt notified to follow up with dr scott if having ongoing troubles.

## 2018-11-03 ENCOUNTER — Other Ambulatory Visit: Payer: Self-pay | Admitting: Family Medicine

## 2018-11-05 ENCOUNTER — Encounter: Payer: Self-pay | Admitting: Family Medicine

## 2018-11-05 ENCOUNTER — Ambulatory Visit (INDEPENDENT_AMBULATORY_CARE_PROVIDER_SITE_OTHER): Payer: No Typology Code available for payment source | Admitting: Family Medicine

## 2018-11-05 VITALS — BP 112/72 | Ht 64.0 in | Wt 171.0 lb

## 2018-11-05 DIAGNOSIS — M545 Low back pain, unspecified: Secondary | ICD-10-CM

## 2018-11-05 MED ORDER — MELOXICAM 15 MG PO TABS: 15.0000 mg | ORAL_TABLET | Freq: Every day | ORAL | 2 refills | Status: DC

## 2018-11-05 NOTE — Progress Notes (Signed)
Subjective:    Patient ID: Marissa Burnett, female    DOB: Jun 17, 1986, 32 y.o.   MRN: [id number]  HPI Patient arrives with back pain and numbness in right leg. Patient has been seen several times this year for the back pain and was referred to ortho and chiropractor and neurosurgeon. Patient had MRI thru ortho. Yesterday patient reports that while she was driving her right leg from her thigh to her toes wen numb to the point she feared she would not be able to feel the break pedal.  Reports tingling and weakness to right leg when driving home from work, lasted about 20 minutes, resolved after she stood up. Had worked 4-12 hour shifts in a row previously.   Reports 2 year hx of low back pain, mostly on left side. Constant throbbing pain. Pain is worse when sitting and when she goes to bed. Uses foam roller nightly, has tried yoga, back stretches, tens machine, heating pad/ice packs. Taking parafon, voltaren, ibuprofen. Reports nothing is helpful for her anymore.  Has an appt on 11/10/18 with PT for dry needling. Last saw ortho doc in Feb or March. Had an SI injection in April - but did not have steroid in the injection d/t insurance not covering. Pain got better after this but did not go completely away. States she does not have a f/u with Dr. Rolena Infante (ortho), not sure she wants to schedule further appts with him.     Review of Systems  Musculoskeletal: Positive for back pain.  Neurological: Positive for weakness and numbness.  No change in bowel or bladder habits.  No saddle anesthesia or paresthesias.     Objective:   Physical Exam  Constitutional: She is oriented to person, place, and time. She appears well-developed and well-nourished. No distress.  HENT:  Head: Normocephalic and atraumatic.  Cardiovascular: Normal rate, regular rhythm and normal heart sounds.  No murmur heard. Pulmonary/Chest: Effort normal and breath sounds normal. No respiratory distress.  Musculoskeletal:  Lower  back: Tender around left lower spinal muscles.  Pain worse with back range of motion exercises.  Ipsilateral straight leg raise is positive.  Contralateral straight leg raise negative.  Lower extremity reflexes and strength intact bilaterally.  Neurological: She is alert and oriented to person, place, and time.  Skin: Skin is warm and dry.  Psychiatric: She has a normal mood and affect.  Nursing note and vitals reviewed.      Assessment & Plan:  Lumbar pain This is a chronic issue patient has seen multiple specialists for over the last 2 years.  New issue with tingling to her right leg.  If this becomes a persistent problem she will need to follow-up for further evaluation.  Will likely at that time need an EMG for further nerve testing.  Continue with future dry needling appointment, continue stretches, will change her anti-inflammatory to Mobic daily.  Discussed importance of not combining anti-inflammatory medications.  May continue use of muscle relaxers at night.  We will follow-up if symptoms worsen or fail to improve.  Scheduled follow-up in 4 to 6 weeks with Dr. Nicki Reaper.  Dr. Sallee Lange was consulted on this case and is in agreement with the above treatment plan.  As attending physician to this patient visit, this patient was seen in conjunction with the nurse practitioner.  The history,physical and treatment plan was reviewed with the nurse practitioner and pertinent findings were verified with the patient.  Also the treatment plan was reviewed with the patient  while they were present. SAL

## 2018-11-09 ENCOUNTER — Ambulatory Visit: Payer: No Typology Code available for payment source | Attending: Family Medicine | Admitting: Physical Therapy

## 2018-11-09 ENCOUNTER — Other Ambulatory Visit: Payer: Self-pay

## 2018-11-09 ENCOUNTER — Encounter: Payer: Self-pay | Admitting: Physical Therapy

## 2018-11-09 DIAGNOSIS — M5441 Lumbago with sciatica, right side: Secondary | ICD-10-CM | POA: Insufficient documentation

## 2018-11-09 DIAGNOSIS — M5442 Lumbago with sciatica, left side: Secondary | ICD-10-CM | POA: Diagnosis present

## 2018-11-09 DIAGNOSIS — G8929 Other chronic pain: Secondary | ICD-10-CM | POA: Insufficient documentation

## 2018-11-09 DIAGNOSIS — M62830 Muscle spasm of back: Secondary | ICD-10-CM | POA: Diagnosis present

## 2018-11-09 DIAGNOSIS — M6281 Muscle weakness (generalized): Secondary | ICD-10-CM | POA: Insufficient documentation

## 2018-11-09 MED FILL — OMEPRAZOLE 20 MG CPDR: 20 | 30 days supply | Qty: 30 | Fill #5

## 2018-11-09 NOTE — Patient Instructions (Addendum)
Trigger Point Dry Needling  . What is Trigger Point Dry Needling (DN)? o DN is a physical therapy technique used to treat muscle pain and dysfunction. Specifically, DN helps deactivate muscle trigger points (muscle knots).  o A thin filiform needle is used to penetrate the skin and stimulate the underlying trigger point. The goal is for a local twitch response (LTR) to occur and for the trigger point to relax. No medication of any kind is injected during the procedure.   . What Does Trigger Point Dry Needling Feel Like?  o The procedure feels different for each individual patient. Some patients report that they do not actually feel the needle enter the skin and overall the process is not painful. Very mild bleeding may occur. However, many patients feel a deep cramping in the muscle in which the needle was inserted. This is the local twitch response.   Marland Kitchen How Will I feel after the treatment? o Soreness is normal, and the onset of soreness may not occur for a few hours. Typically this soreness does not last longer than two days.  o Bruising is uncommon, however; ice can be used to decrease any possible bruising.  o In rare cases feeling tired or nauseous after the treatment is normal. In addition, your symptoms may get worse before they get better, this period will typically not last longer than 24 hours.   . What Can I do After My Treatment? o Increase your hydration by drinking more water for the next 24 hours. o You may place ice or heat on the areas treated that have become sore, however, do not use heat on inflamed or bruised areas. Heat often brings more relief post needling. o You can continue your regular activities, but vigorous activity is not recommended initially after the treatment for 24 hours. o DN is best combined with other physical therapy such as strengthening, stretching, and other therapies.    Knee-to-Chest Stretch: Unilateral    With hand behind right knee, pull knee in  to chest until a comfortable stretch is felt in lower back and buttocks. Keep back relaxed. Hold __30__ seconds. Repeat __1-3__ times per set. Do __1__ sets per session. Do ___1-2_ sessions per day.   Knee-to-Chest Stretch: Bilateral    With hands behind knees, pull both knees in to chest until a comfortable stretch is felt in lower back and buttocks. Keep back relaxed. Hold __30__ seconds. Repeat _1-3___ times per set. Do __1__ sets per session. Do __1-2__ sessions per day.

## 2018-11-09 NOTE — Therapy (Signed)
[place], [place], [address] Phone: [phone]   Fax:  [phone]  Physical Therapy Evaluation  Patient Details  Name: Marissa Burnett MRN: [id number] Date of Birth: Jul 07, 1986 Referring Provider (PT): Mora Appl NP   Encounter Date: 11/09/2018  PT End of Session - 11/09/18 1232    Visit Number  1    Number of Visits  6    Date for PT Re-Evaluation  12/21/18    PT Start Time  1232    PT Stop Time  1333    PT Time Calculation (min)  61 min    Activity Tolerance  Patient tolerated treatment well       Past Medical History:  Diagnosis Date  . Abnormal Papanicolaou smear of cervix 10/15/2016   LSIL+HPV  . Allergy   . Anxiety   . Chronic lumbar pain 09/27/2018   Patient has seen Dr. Arnoldo Morale neurosurgery September 2019-they do not feel there is anything surgical that can help-they are recommending injections with pain medicine specialist  . Dysrhythmia    history of palpitations   . GERD (gastroesophageal reflux disease)   . Heart murmur birth    Past Surgical History:  Procedure Laterality Date  . ABDOMINOPLASTY    . BREAST ENHANCEMENT SURGERY    . CERVICAL CONIZATION W/BX N/A 12/13/2016   Procedure: CONIZATION CERVIX WITH BIOPSY--cold knife conization;  Surgeon: Jonnie Kind, MD;  Location: [facility];  Service: Gynecology;  Laterality: N/A;  . CHOLECYSTECTOMY  02/2002  . ENDOSCOPIC RETROGRADE CHOLANGIOPANCREATOGRAPHY (ERCP) WITH PROPOFOL    . GALLBLADDER SURGERY    . OTHER SURGICAL HISTORY     stent in kidney during pregnancy,2002; removed in 2003    There were no vitals filed for this visit.   Subjective Assessment - 11/09/18 1232    Subjective  Pt reports she devoloped Lt sided hip pain about 2 yrs ago, she has changed what she does and now has pain on the Rt side.  She was told she had bursitis, saw chiropractor, a little PT then with minimal result. Tried massage, SI  joint injection in Feb with minimal relief. Uses home TENs, heat, new matress, foam roller and having to take meds around the clock .  Her back pops all the time. pain down in Rt LE after her last 4 day 12 ours shift. (last week)     How long can you sit comfortably?  20-30'    How long can you walk comfortably?  no limitations    Diagnostic tests  MRI (-)     Currently in Pain?  Yes    Pain Score  4     Pain Location  Back    Pain Orientation  Lower;Left;Right    Pain Descriptors / Indicators  Aching;Pins and needles;Dull    Pain Radiating Towards  occasionally into her legs    Pain Onset  More than a month ago    Pain Frequency  Constant    Aggravating Factors   lying down and sitting    Pain Relieving Factors  moving around         [facility] PT Assessment - 11/09/18 0001      Assessment   Medical Diagnosis  LBP with Lt and Rt LE radiculapthy    Referring Provider (PT)  Nicki Reaper Johnney Ou NP    Onset Date/Surgical Date  11/09/16   flared up bad over this year   Hand Dominance  Right  Next MD Visit  12/11/18    Prior Therapy  1.5 yrs ago      Precautions   Precautions  None      Balance Screen   Has the patient fallen in the past 6 months  No      Prior Function   Level of Independence  Independent    Vocation  Full time employment    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      Observation/Other Assessments   Focus on Therapeutic Outcomes (FOTO)   56% limited      Posture/Postural Control   Posture/Postural Control  Postural limitations    Postural Limitations  Increased lumbar lordosis      ROM / Strength   AROM / PROM / Strength  AROM;Strength      AROM   AROM Assessment Site  Lumbar;Hip    Lumbar Flexion  to floor however pain 25% into the motion into the Lt hip    Lumbar Extension  75% present with pain     Lumbar - Right Rotation  75% present    Lumbar - Left Rotation  75% present        Strength   Strength Assessment Site  Hip;Knee;Ankle;Lumbar    Right/Left Hip  --   WNL except LT flex and ext 4+/5   Right/Left Knee  --   WNL   Right/Left Ankle  --   WNL   Lumbar Flexion  --   transverse ab good bilat.    Lumbar Extension  --   multifidi Rt fair, Lt poor      Flexibility   Soft Tissue Assessment /Muscle Length  --   some tightness in bilat gluts      Palpation   Spinal mobility  slight hypomobility in lumbar spine    Palpation comment  tender and tight in bilat gluts, piriformis with pain, some tightness in Rt QL and bilat lumbar paraspinals                Objective measurements completed on examination: See above findings.      [place] [facility] Adult PT Treatment/Exercise - 11/09/18 0001      Exercises   Exercises  --   STKC, BKTC and knee to opposite shoulder stretches.      Modalities   Modalities  Electrical Stimulation;Moist Heat      Moist Heat Therapy   Number Minutes Moist Heat  15 Minutes    Moist Heat Location  --   buttocks     Electrical Stimulation   Electrical Stimulation Location  Lt buttocks    Electrical Stimulation Action  IFC    Electrical Stimulation Parameters  to tolerance    Electrical Stimulation Goals  Tone;Pain      Manual Therapy   Manual Therapy  Soft tissue mobilization    Soft tissue mobilization  STM to bilat gluts and piriformis       Trigger Point Dry Needling - 11/09/18 1326    Consent Given?  Yes    Education Handout Provided  Yes    Muscles Treated Lower Body  Gluteus maximus;Gluteus minimus;Piriformis    Gluteus Maximus Response  Palpable increased muscle length;Twitch response elicited   Lt   Gluteus Minimus Response  Palpable increased muscle length;Twitch response elicited   Lt   Piriformis Response  Palpable increased muscle length;Twitch response elicited   Lt  PT Education - 11/09/18 1322    Education Details  HEP and DN    Person(s) Educated  Patient    Methods   Explanation;Demonstration;Handout    Comprehension  Returned demonstration;Verbalized understanding          PT Long Term Goals - 11/09/18 1336      PT LONG TERM GOAL #1   Title  I with advanced HEP to include return to gym or classes ( 12/21/18)     Time  6    Period  Weeks    Status  New    Target Date  12/21/18      PT LONG TERM GOAL #2   Title  demo lumbar ROM WFL with minimal to no pain ( 12/21/18)     Time  6    Period  Weeks    Status  New    Target Date  12/21/18      PT LONG TERM GOAL #3   Title  tolerate working 3 12 hrs shift at work with no more than 2/10 pain in her back and legs. ( 12/21/18)     Time  6    Period  Weeks    Status  New      PT LONG TERM GOAL #4   Title  improve FOTO =/< 415 limited ( 12/21/18)     Time  6    Period  Weeks    Status  New    Target Date  12/21/18      PT LONG TERM GOAL #5   Title  demo strong contraction of bilat mulitifidi to support spine during core work ( 12/21/18)     Time  6    Period  Weeks    Status  New    Target Date  12/21/18             Plan - 11/09/18 1332    Clinical Impression Statement  32 yo female with long standing h/o LBP with radicular symptoms into the Lt LE, just recently began into the Rt.  She has had xrays and MRI and they were negative.  Has failed tx with a chiropractor, massage therapist and has had only a couple PT sessions over a year ago.  She has been unable to perform her exercise due to the pain and it is interfering with her ability to sleep and work.  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  She has tightness in bilat buttocks and lumbar paraspinals, limited lumbar motion, stiffness in her hips and some core weakness.     Clinical Presentation  Stable    Clinical Decision Making  Low    Rehab Potential  Excellent    PT Frequency  1x / week    PT Duration  6 weeks    PT Treatment/Interventions  Dry needling;Neuromuscular re-education;Spinal Manipulations;Manual  techniques;Moist Heat;Traction;Patient/family education;Taping;Therapeutic exercise;Ultrasound;Electrical Stimulation;Passive range of motion    PT Next Visit Plan  cont with DN to bilat gluts, piriformis, paraspinals, add in deep core work, modalities PRN    Consulted and Agree with Plan of Care  Patient       Patient will benefit from skilled therapeutic intervention in order to improve the following deficits and impairments:  Pain, Increased muscle spasms, Decreased range of motion, Decreased strength  Visit Diagnosis: Chronic bilateral low back pain with bilateral sciatica - Plan: PT plan of care cert/re-cert  Muscle spasm of back - Plan: PT plan of care cert/re-cert  Muscle  weakness (generalized) - Plan: PT plan of care cert/re-cert     Problem List Patient Active Problem List   Diagnosis Date Noted  . Chronic lumbar pain 09/27/2018  . CIN III (cervical intraepithelial neoplasia grade III) with severe dysplasia 11/18/2016  . Abnormal Papanicolaou smear of cervix 10/15/2016  . Urticaria 03/16/2016  . Morbid obesity ([place]) 07/04/2015  . Anxiety 06/30/2015  . ADD (attention deficit disorder) 07/20/2014    Jeral Pinch PT 11/09/2018, 1:41 PM  [facility] [address] [place], [place], [address] Phone: [phone]   Fax:  [phone]  Name: Marissa Burnett MRN: [id number] Date of Birth: 1986/02/12

## 2018-11-10 ENCOUNTER — Ambulatory Visit: Payer: No Typology Code available for payment source | Admitting: Physical Therapy

## 2018-11-13 ENCOUNTER — Encounter: Payer: Self-pay | Admitting: Family Medicine

## 2018-11-13 DIAGNOSIS — M5441 Lumbago with sciatica, right side: Principal | ICD-10-CM

## 2018-11-13 DIAGNOSIS — G8929 Other chronic pain: Secondary | ICD-10-CM

## 2018-11-13 DIAGNOSIS — M5442 Lumbago with sciatica, left side: Principal | ICD-10-CM

## 2018-11-15 NOTE — Telephone Encounter (Signed)
Nurses Please let the patient know I reviewed over her message and her MRI from earlier this year It did not show any signs of cancer We can do some lab work to try to look for autoimmune illness I recommend C-reactive protein I also recommend sedimentation rate ANA antibody as well  Given the normal MRI I doubt that there is cancer but I will discuss with radiology to see if they recommend any other tests Please let the patient know that I still recommend following through with her dry needling Please document what is she taking currently to try to help her pain?  Finally please let me know what the patient has to say regarding all of this but also send me a telephone message regarding this as well

## 2018-11-16 NOTE — Telephone Encounter (Signed)
Is the HLA B 27 for antigen? This is the only option in Epic I see. Please advise,Thanks

## 2018-11-16 NOTE — Telephone Encounter (Signed)
It would be fine to order the sed rate, C-reactive protein, ANA antibody, HLA B 27 genetic tests  As for the anti-inflammatory I feel she needs to pick one and stick with it taking several together increases her risk of kidney damage  As for the anxiety aspect there is no perfect solution. For some-if one takes a daily dose of a serotonin reuptake inhibitor such as Celexa or Zoloft that can be beneficial For others occasional use of Xanax or similar nerve pill can be done to help with anxiousness but we do not recommend this on a regular basis because it can be habit-forming  Patient does have a follow-up visit in December that we can discuss all of this in detail-if the patient is interested or wants the follow-up appointment moved up please let me know and we can try to accommodate

## 2018-11-16 NOTE — Telephone Encounter (Signed)
I spoke with pt and she says she is taking Ibuprofen and Voltaren. She thinks she needs something for her anxiety as a breakthrough medication as the pain is causing her to have more anxiety.She does not want to have to take anything long term. She says she is fine with the crp,sedrate,ana,also Gene test HLA B27 to see if she has As. I have not put the labs in until I get an ok to add her requested lab. Please advise.

## 2018-11-16 NOTE — Telephone Encounter (Signed)
HLA B27 antigen is the appropriate test

## 2018-11-16 NOTE — Addendum Note (Signed)
Addended by: Karle Barr on: 11/16/2018 11:07 AM   Modules accepted: Orders

## 2018-11-16 NOTE — Telephone Encounter (Signed)
Patient is aware lab order sent and she will reschedule appt with [place] for sooner.Transferred to the front to schedule.

## 2018-11-19 ENCOUNTER — Ambulatory Visit (INDEPENDENT_AMBULATORY_CARE_PROVIDER_SITE_OTHER): Payer: No Typology Code available for payment source | Admitting: Family Medicine

## 2018-11-19 ENCOUNTER — Encounter: Payer: Self-pay | Admitting: Family Medicine

## 2018-11-19 VITALS — BP 122/82 | Ht 64.0 in | Wt 171.0 lb

## 2018-11-19 DIAGNOSIS — M5441 Lumbago with sciatica, right side: Secondary | ICD-10-CM

## 2018-11-19 DIAGNOSIS — G8929 Other chronic pain: Secondary | ICD-10-CM

## 2018-11-19 DIAGNOSIS — M5442 Lumbago with sciatica, left side: Secondary | ICD-10-CM | POA: Diagnosis not present

## 2018-11-19 LAB — HLA-B27 ANTIGEN: HLA B27: NEGATIVE

## 2018-11-19 LAB — SEDIMENTATION RATE: SED RATE: 3 mm/h (ref 0–32)

## 2018-11-19 LAB — ANA: ANA: NEGATIVE

## 2018-11-19 LAB — C-REACTIVE PROTEIN

## 2018-11-19 MED ORDER — CYCLOBENZAPRINE HCL 10 MG PO TABS: ORAL_TABLET | ORAL | 0 refills | Status: DC

## 2018-11-19 MED ORDER — ALPRAZOLAM 0.5 MG PO TABS: ORAL_TABLET | ORAL | 0 refills | Status: DC

## 2018-11-19 NOTE — Progress Notes (Signed)
   Subjective:    Patient ID: Marissa Burnett, female    DOB: 10/06/1986, 32 y.o.   MRN: [id number]  HPI  Patient arrives with ongoing back pain and tingling in legs. Patient had recent blood work and would like the results. Patient states she is experiencing a lot of anxiety from her symptoms.  Patient had a really bad panic attack over the weekend. Patient finds her self sometimes feeling anxious about her symptoms she worries that there is a severe underlying problem she is been to a back specialist neuro specialist as well as a physical therapist specialist has not got any relief did not tolerate the anti-inflammatory recently prescribed Review of Systems Patient with back pain discomfort Denies rectal pain Denies abdominal pain No coughing wheezing or difficulty breathing    Objective:   Physical Exam Lungs clear respiratory rate is normal heart regular extremities no edema skin warm dry subjective low back pain negative straight leg raise small node noted in the lower back which is very common for people to have does not appear to be a sign of cancer       Assessment & Plan:  Lower back pain continue dry needling with physical therapy Considering getting hooked in with a physical medicine specialist Patient to consider Cymbalta to see if it helps her with her pain Neuro medication for sparing use caution drowsiness not for frequent use because of habit-forming Follow-up within 3 to 6 months depending on what she decides to do We will get to her the name of a physical medicine specialist that she can look into and if she needs [place] to help set up consultation she will let us know

## 2018-11-26 ENCOUNTER — Encounter

## 2018-11-27 ENCOUNTER — Ambulatory Visit: Payer: No Typology Code available for payment source | Admitting: Family Medicine

## 2018-11-27 ENCOUNTER — Telehealth: Payer: No Typology Code available for payment source | Admitting: Family

## 2018-11-27 DIAGNOSIS — J029 Acute pharyngitis, unspecified: Secondary | ICD-10-CM | POA: Diagnosis not present

## 2018-11-27 NOTE — Progress Notes (Signed)
Thank you for the details you included in the comment boxes. Those details are very helpful in determining the best course of treatment for you and help [place] to provide the best care.  We are sorry that you are not feeling well.  Here is how we plan to help!  Based on what you have shared with me it looks like you have a condition called pharyngitis.  Pharyngitis is inflammation in the back of the throat which can cause a sore throat, scratchiness and sometimes difficulty swallowing.   Pharyngitis is typically caused by a respiratory virus and will just run its course.  Please keep in mind that your symptoms could last up to 10 days.  For throat pain, we recommend over the counter oral pain relief medications such as acetaminophen or aspirin, or anti-inflammatory medications such as ibuprofen or naproxen sodium.  Topical treatments such as oral throat lozenges or sprays may be used as needed.  Avoid close contact with loved ones, especially the very young and elderly.  Remember to wash your hands thoroughly throughout the day as this is the number one way to prevent the spread of infection and wipe down door knobs and counters with disinfectant.  After careful review of your answers, I would not recommend and antibiotic for your condition.  Antibiotics should not be used to treat conditions that we suspect are caused by viruses like the virus that causes the common cold or flu.  Providers prescribe antibiotics to treat infections caused by bacteria. Antibiotics are very powerful in treating bacterial infections when they are used properly.  To maintain their effectiveness, they should be used only when necessary.  Overuse of antibiotics has resulted in the development of super bugs that are resistant to treatment!    Home Care:  Only take medications as instructed by your medical team.  Do not drink alcohol while taking these medications.  A steam or ultrasonic humidifier can help congestion.  You can  place a towel over your head and breathe in the steam from hot water coming from a faucet.  Avoid close contacts especially the very young and the elderly.  Cover your mouth when you cough or sneeze.  Always remember to wash your hands.  Get Help Right Away If:  You develop worsening fever or throat pain.  You develop a severe head ache or visual changes.  Your symptoms persist after you have completed your treatment plan.  Make sure you  Understand these instructions.  Will watch your condition.  Will get help right away if you are not doing well or get worse.  Your e-visit answers were reviewed by a board certified advanced clinical practitioner to complete your personal care plan.  Depending on the condition, your plan could have included both over the counter or prescription medications.  If there is a problem please reply  once you have received a response from your provider.  Your safety is important to [place].  If you have drug allergies check your prescription carefully.    You can use MyChart to ask questions about todays visit, request a non-urgent call back, or ask for a work or school excuse for 24 hours related to this e-Visit. If it has been greater than 24 hours you will need to follow up with your provider, or enter a new e-Visit to address those concerns.  You will get an e-mail in the next two days asking about your experience.  I hope that your e-visit has been valuable  and will speed your recovery. Thank you for using e-visits.

## 2018-12-01 ENCOUNTER — Ambulatory Visit: Payer: No Typology Code available for payment source | Admitting: Physical Therapy

## 2018-12-11 ENCOUNTER — Ambulatory Visit (INDEPENDENT_AMBULATORY_CARE_PROVIDER_SITE_OTHER): Payer: No Typology Code available for payment source | Admitting: Family Medicine

## 2018-12-11 ENCOUNTER — Encounter: Payer: Self-pay | Admitting: Physical Therapy

## 2018-12-11 ENCOUNTER — Ambulatory Visit: Payer: No Typology Code available for payment source | Attending: Family Medicine | Admitting: Physical Therapy

## 2018-12-11 ENCOUNTER — Encounter: Payer: Self-pay | Admitting: Family Medicine

## 2018-12-11 VITALS — BP 114/74 | Temp 98.3°F | Ht 64.0 in | Wt 171.0 lb

## 2018-12-11 DIAGNOSIS — G8929 Other chronic pain: Secondary | ICD-10-CM | POA: Diagnosis present

## 2018-12-11 DIAGNOSIS — M62830 Muscle spasm of back: Secondary | ICD-10-CM | POA: Diagnosis present

## 2018-12-11 DIAGNOSIS — J208 Acute bronchitis due to other specified organisms: Secondary | ICD-10-CM | POA: Diagnosis not present

## 2018-12-11 DIAGNOSIS — M5441 Lumbago with sciatica, right side: Secondary | ICD-10-CM | POA: Diagnosis present

## 2018-12-11 DIAGNOSIS — M6281 Muscle weakness (generalized): Secondary | ICD-10-CM | POA: Diagnosis present

## 2018-12-11 DIAGNOSIS — M5442 Lumbago with sciatica, left side: Secondary | ICD-10-CM | POA: Diagnosis present

## 2018-12-11 MED ORDER — BENZONATATE 100 MG PO CAPS: 100.0000 mg | ORAL_CAPSULE | Freq: Three times a day (TID) | ORAL | 1 refills | Status: DC | PRN

## 2018-12-11 NOTE — Therapy (Signed)
[place], [place], [address] Phone: [phone]   Fax:  [phone]  Physical Therapy Treatment  Patient Details  Name: Marissa Burnett MRN: [id number] Date of Birth: 1986-12-06 Referring Provider (PT): Mora Appl NP   Encounter Date: 12/11/2018  PT End of Session - 12/11/18 1058    Visit Number  2    Number of Visits  6    Date for PT Re-Evaluation  12/21/18    PT Start Time  1016    PT Stop Time  1058    PT Time Calculation (min)  42 min    Activity Tolerance  Patient tolerated treatment well    Behavior During Therapy  [facility] for tasks assessed/performed       Past Medical History:  Diagnosis Date  . Abnormal Papanicolaou smear of cervix 10/15/2016   LSIL+HPV  . Allergy   . Anxiety   . Chronic lumbar pain 09/27/2018   Patient has seen Dr. Arnoldo Morale neurosurgery September 2019-they do not feel there is anything surgical that can help-they are recommending injections with pain medicine specialist  . Dysrhythmia    history of palpitations   . GERD (gastroesophageal reflux disease)   . Heart murmur birth    Past Surgical History:  Procedure Laterality Date  . ABDOMINOPLASTY    . BREAST ENHANCEMENT SURGERY    . CERVICAL CONIZATION W/BX N/A 12/13/2016   Procedure: CONIZATION CERVIX WITH BIOPSY--cold knife conization;  Surgeon: Jonnie Kind, MD;  Location: [facility];  Service: Gynecology;  Laterality: N/A;  . CHOLECYSTECTOMY  02/2002  . ENDOSCOPIC RETROGRADE CHOLANGIOPANCREATOGRAPHY (ERCP) WITH PROPOFOL    . GALLBLADDER SURGERY    . OTHER SURGICAL HISTORY     stent in kidney during pregnancy,2002; removed in 2003    There were no vitals filed for this visit.  Subjective Assessment - 12/11/18 1022    Subjective  Was very stressed with moving. As soon as we closed on our house the pain went away. Just stiff at lower back right now                       [facility]  Adult PT Treatment/Exercise - 12/11/18 0001      Exercises   Exercises  Lumbar      Pilates   Pilates Reformer  foot work basic      Lumbar Exercises: Standing   Other Standing Lumbar Exercises  squat with core contraction      Lumbar Exercises: Supine   AB Set Limitations  tra engagement    Other Supine Lumbar Exercises  hundreds series             PT Education - 12/11/18 1153    Education Details  DN, gym workouts, core control, exercise form/rationale    Person(s) Educated  Patient    Methods  Explanation;Demonstration;Tactile cues;Verbal cues    Comprehension  Verbalized understanding;Need further instruction;Returned demonstration;Verbal cues required;Tactile cues required          PT Long Term Goals - 11/09/18 1336      PT LONG TERM GOAL #1   Title  I with advanced HEP to include return to gym or classes ( 12/21/18)     Time  6    Period  Weeks    Status  New    Target Date  12/21/18      PT LONG TERM GOAL #2   Title  demo lumbar ROM WFL with  minimal to no pain ( 12/21/18)     Time  6    Period  Weeks    Status  New    Target Date  12/21/18      PT LONG TERM GOAL #3   Title  tolerate working 3 12 hrs shift at work with no more than 2/10 pain in her back and legs. ( 12/21/18)     Time  6    Period  Weeks    Status  New      PT LONG TERM GOAL #4   Title  improve FOTO =/< 415 limited ( 12/21/18)     Time  6    Period  Weeks    Status  New    Target Date  12/21/18      PT LONG TERM GOAL #5   Title  demo strong contraction of bilat mulitifidi to support spine during core work ( 12/21/18)     Time  6    Period  Weeks    Status  New    Target Date  12/21/18            Plan - 12/11/18 1121    Clinical Impression Statement  At this time pt reports resolution of radicular symptoms with decrease in stress selling house. She still has stiffness in lower back but no other big complaints. Back pain has been chronic and would like to get back to  the gym. Pt will continue to benefit from skilled training for proper muscle activation and lumbar stability.     PT Treatment/Interventions  Dry needling;Neuromuscular re-education;Spinal Manipulations;Manual techniques;Moist Heat;Traction;Patient/family education;Taping;Therapeutic exercise;Ultrasound;Electrical Stimulation;Passive range of motion    PT Next Visit Plan  reformer- would like to transition to pilates    PT Home Exercise Plan  core contractions, hundreds, squat + core.    Consulted and Agree with Plan of Care  Patient       Patient will benefit from skilled therapeutic intervention in order to improve the following deficits and impairments:  Pain, Increased muscle spasms, Decreased range of motion, Decreased strength  Visit Diagnosis: Chronic bilateral low back pain with bilateral sciatica  Muscle spasm of back  Muscle weakness (generalized)     Problem List Patient Active Problem List   Diagnosis Date Noted  . Chronic lumbar pain 09/27/2018  . CIN III (cervical intraepithelial neoplasia grade III) with severe dysplasia 11/18/2016  . Abnormal Papanicolaou smear of cervix 10/15/2016  . Urticaria 03/16/2016  . Morbid obesity ([place]) 07/04/2015  . Anxiety 06/30/2015  . ADD (attention deficit disorder) 07/20/2014    Kais Monje C. Kimmy Parish PT, DPT 12/11/18 11:53 AM   [facility] [address] [place], [place], [address] Phone: [phone]   Fax:  [phone]  Name: Marissa Burnett MRN: [id number] Date of Birth: Oct 23, 1986

## 2018-12-11 NOTE — Progress Notes (Signed)
   Subjective:    Patient ID: Marissa Burnett, female    DOB: 09-08-1986, 32 y.o.   MRN: [id number]  HPI Patient is here today to follow up on her back pain. She says the back pain is better now,since she has moved. She is still doing pt once per week. She did go today.  She says after she left here the last time she delevoped cough,sore throat,runny nose and congestion.She had a e visit and they told her it was a virus that she would have to let it run its course. She says most of the symptoms have resolved,except a nagging cough and periodic shortness of breath.She has been taking otc cold medication,dayquil,alkaseltzer for symptom relief.   Review of Systems  Constitutional: Negative for activity change and fever.  HENT: Positive for congestion and rhinorrhea. Negative for ear pain.   Eyes: Negative for discharge.  Respiratory: Positive for cough. Negative for shortness of breath and wheezing.   Cardiovascular: Negative for chest pain.       Objective:   Physical Exam Vitals signs and nursing note reviewed.  Constitutional:      Appearance: She is well-developed.  HENT:     Head: Normocephalic.     Nose: Nose normal.     Mouth/Throat:     Pharynx: No oropharyngeal exudate.  Neck:     Musculoskeletal: Neck supple.  Cardiovascular:     Rate and Rhythm: Normal rate.     Heart sounds: Normal heart sounds. No murmur.  Pulmonary:     Effort: Pulmonary effort is normal.     Breath sounds: Normal breath sounds. No wheezing.  Lymphadenopathy:     Cervical: No cervical adenopathy.  Skin:    General: Skin is warm and dry.           Assessment & Plan:  Low back pain actually doing much better Certainly its very good news that this is getting better without major intervention She will continue the physical therapy  Stress related issues-she is now moved into her house so she is feeling much better about how that is doing  She does have significant coughing that is been  going on for several weeks at first it was pretty rough now starting to get a little bit better but she still has a lot of chest congestion and coughing which bothers her she wonders what is triggering this but denies excessive shortness of breath or wheezing  I believe that this is a viral bronchitis that will take some time to go away but if she is not dramatically or significantly better over the next 7 days she can call us and we will send in an antibiotic she will call us sooner problems

## 2018-12-13 ENCOUNTER — Telehealth: Payer: No Typology Code available for payment source | Admitting: Family

## 2018-12-13 DIAGNOSIS — B9689 Other specified bacterial agents as the cause of diseases classified elsewhere: Secondary | ICD-10-CM

## 2018-12-13 DIAGNOSIS — J208 Acute bronchitis due to other specified organisms: Secondary | ICD-10-CM

## 2018-12-13 MED ORDER — PREDNISONE 10 MG (21) PO TBPK: ORAL_TABLET | ORAL | 0 refills | Status: DC

## 2018-12-13 MED ORDER — BENZONATATE 100 MG PO CAPS: 100.0000 mg | ORAL_CAPSULE | Freq: Three times a day (TID) | ORAL | 0 refills | Status: DC | PRN

## 2018-12-13 MED ORDER — AZITHROMYCIN 250 MG PO TABS
ORAL_TABLET | ORAL | 0 refills | Status: DC
Start: 2018-12-13 — End: 2019-04-01

## 2018-12-13 NOTE — Progress Notes (Signed)
We are sorry that you are not feeling well.  Here is how we plan to help!  Based on your presentation I believe you most likely have A cough due to bacteria.  When patients have a fever and a productive cough with a change in color or increased sputum production, we are concerned about bacterial bronchitis.  If left untreated it can progress to pneumonia.  If your symptoms do not improve with your treatment plan it is important that you contact your provider.   I have prescribed Azithromyin 250 mg: two tablets now and then one tablet daily for 4 additonal days    In addition you may use A non-prescription cough medication called Robitussin DAC. Take 2 teaspoons every 8 hours or Delsym: take 2 teaspoons every 12 hours., A non-prescription cough medication called Mucinex DM: take 2 tablets every 12 hours. and A prescription cough medication called Tessalon Perles 100mg. You may take 1-2 capsules every 8 hours as needed for your cough.  Prednisone 10 mg daily for 6 days (see taper instructions below)  Directions for 6 day taper: Day 1: 2 tablets before breakfast, 1 after both lunch & dinner and 2 at bedtime Day 2: 1 tab before breakfast, 1 after both lunch & dinner and 2 at bedtime Day 3: 1 tab at each meal & 1 at bedtime Day 4: 1 tab at breakfast, 1 at lunch, 1 at bedtime Day 5: 1 tab at breakfast & 1 tab at bedtime Day 6: 1 tab at breakfast   From your responses in the eVisit questionnaire you describe inflammation in the upper respiratory tract which is causing a significant cough.  This is commonly called Bronchitis and has four common causes:    Allergies  Viral Infections  Acid Reflux  Bacterial Infection Allergies, viruses and acid reflux are treated by controlling symptoms or eliminating the cause. An example might be a cough caused by taking certain blood pressure medications. You stop the cough by changing the medication. Another example might be a cough caused by acid reflux.  Controlling the reflux helps control the cough.  USE OF BRONCHODILATOR ("RESCUE") INHALERS: There is a risk from using your bronchodilator too frequently.  The risk is that over-reliance on a medication which only relaxes the muscles surrounding the breathing tubes can reduce the effectiveness of medications prescribed to reduce swelling and congestion of the tubes themselves.  Although you feel brief relief from the bronchodilator inhaler, your asthma may actually be worsening with the tubes becoming more swollen and filled with mucus.  This can delay other crucial treatments, such as oral steroid medications. If you need to use a bronchodilator inhaler daily, several times per day, you should discuss this with your provider.  There are probably better treatments that could be used to keep your asthma under control.     HOME CARE . Only take medications as instructed by your medical team. . Complete the entire course of an antibiotic. . Drink plenty of fluids and get plenty of rest. . Avoid close contacts especially the very young and the elderly . Cover your mouth if you cough or cough into your sleeve. . Always remember to wash your hands . A steam or ultrasonic humidifier can help congestion.   GET HELP RIGHT AWAY IF: . You develop worsening fever. . You become short of breath . You cough up blood. . Your symptoms persist after you have completed your treatment plan MAKE SURE YOU   Understand these instructions.    Will watch your condition.  Will get help right away if you are not doing well or get worse.  Your e-visit answers were reviewed by a board certified advanced clinical practitioner to complete your personal care plan.  Depending on the condition, your plan could have included both over the counter or prescription medications. If there is a problem please reply  once you have received a response from your provider. Your safety is important to [place].  If you have drug allergies  check your prescription carefully.    You can use MyChart to ask questions about today's visit, request a non-urgent call back, or ask for a work or school excuse for 24 hours related to this e-Visit. If it has been greater than 24 hours you will need to follow up with your provider, or enter a new e-Visit to address those concerns. You will get an e-mail in the next two days asking about your experience.  I hope that your e-visit has been valuable and will speed your recovery. Thank you for using e-visits.

## 2018-12-16 ENCOUNTER — Ambulatory Visit: Payer: No Typology Code available for payment source | Admitting: Physical Therapy

## 2018-12-25 ENCOUNTER — Ambulatory Visit: Payer: No Typology Code available for payment source | Admitting: Physical Therapy

## 2018-12-28 ENCOUNTER — Other Ambulatory Visit: Payer: Self-pay | Admitting: Adult Health

## 2018-12-28 MED FILL — OMEPRAZOLE 20 MG CPDR: 20 | 30 days supply | Qty: 30 | Fill #0

## 2019-01-01 ENCOUNTER — Ambulatory Visit: Payer: No Typology Code available for payment source | Attending: Family Medicine | Admitting: Physical Therapy

## 2019-01-01 ENCOUNTER — Encounter: Payer: Self-pay | Admitting: Physical Therapy

## 2019-01-01 DIAGNOSIS — M5441 Lumbago with sciatica, right side: Secondary | ICD-10-CM | POA: Insufficient documentation

## 2019-01-01 DIAGNOSIS — M5442 Lumbago with sciatica, left side: Secondary | ICD-10-CM | POA: Diagnosis not present

## 2019-01-01 DIAGNOSIS — G8929 Other chronic pain: Secondary | ICD-10-CM | POA: Diagnosis present

## 2019-01-01 DIAGNOSIS — M6281 Muscle weakness (generalized): Secondary | ICD-10-CM | POA: Diagnosis present

## 2019-01-01 DIAGNOSIS — M62830 Muscle spasm of back: Secondary | ICD-10-CM

## 2019-01-01 NOTE — Therapy (Addendum)
[place], [place], [address] Phone: [phone]   Fax:  [phone]  Physical Therapy Treatment/Recertification/Discharge  Patient Details  Name: Marissa Burnett MRN: [id number] Date of Birth: [birth year] Referring Provider (PT): Mora Appl NP   Encounter Date: 01/01/2019  PT End of Session - 01/01/19 1140    Visit Number  3    Number of Visits  6    Date for PT Re-Evaluation  01/22/19    PT Start Time  1100    PT Stop Time  1137    PT Time Calculation (min)  37 min    Activity Tolerance  Patient tolerated treatment well    Behavior During Therapy  [facility] for tasks assessed/performed       Past Medical History:  Diagnosis Date  . Abnormal Papanicolaou smear of cervix 10/15/2016   LSIL+HPV  . Allergy   . Anxiety   . Chronic lumbar pain 09/27/2018   Patient has seen Dr. Arnoldo Morale neurosurgery September 2019-they do not feel there is anything surgical that can help-they are recommending injections with pain medicine specialist  . Dysrhythmia    history of palpitations   . GERD (gastroesophageal reflux disease)   . Heart murmur birth    Past Surgical History:  Procedure Laterality Date  . ABDOMINOPLASTY    . BREAST ENHANCEMENT SURGERY    . CERVICAL CONIZATION W/BX N/A 12/13/2016   Procedure: CONIZATION CERVIX WITH BIOPSY--cold knife conization;  Surgeon: Jonnie Kind, MD;  Location: [facility];  Service: Gynecology;  Laterality: N/A;  . CHOLECYSTECTOMY  02/2002  . ENDOSCOPIC RETROGRADE CHOLANGIOPANCREATOGRAPHY (ERCP) WITH PROPOFOL    . GALLBLADDER SURGERY    . OTHER SURGICAL HISTORY     stent in kidney during pregnancy,2002; removed in 2003    There were no vitals filed for this visit.  Subjective Assessment - 01/01/19 1103    Subjective  Started back to gym- 3 mi on eliptical and light weights, began in Lt hip and moved to lower back. pain with sitting in car.     Currently in  Pain?  Yes    Pain Score  3     Pain Location  Hip    Pain Orientation  Left    Pain Descriptors / Indicators  Aching         [facility] PT Assessment - 01/01/19 0001      Assessment   Medical Diagnosis  LBP with Lt and Rt LE radiculapthy    Referring Provider (PT)  Nicki Reaper Johnney Ou NP    Onset Date/Surgical Date  11/09/16                   [facility] Adult PT Treatment/Exercise - 01/01/19 0001      Lumbar Exercises: Stretches   Figure 4 Stretch  2 reps;30 seconds;With overpressure      Lumbar Exercises: Aerobic   Elliptical  2 min for review      Lumbar Exercises: Standing   Other Standing Lumbar Exercises  squats for review      Lumbar Exercises: Supine   Other Supine Lumbar Exercises  crunches engagement      Manual Therapy   Manual therapy comments  skilled palpation and monitoring during TPDN    Soft tissue mobilization  Lt piriformis & glut max, edu in self use of tennis ball       Trigger Point Dry Needling - 01/01/19 1142    Gluteus Maximus Response  Twitch response  elicited;Palpable increased muscle length    Piriformis Response  Twitch response elicited;Palpable increased muscle length                PT Long Term Goals - 11/09/18 1336      PT LONG TERM GOAL #1   Title  I with advanced HEP to include return to gym or classes ( 12/21/18)     Time  6    Period  Weeks    Status  New    Target Date  12/21/18      PT LONG TERM GOAL #2   Title  demo lumbar ROM WFL with minimal to no pain ( 12/21/18)     Time  6    Period  Weeks    Status  New    Target Date  12/21/18      PT LONG TERM GOAL #3   Title  tolerate working 3 12 hrs shift at work with no more than 2/10 pain in her back and legs. ( 12/21/18)     Time  6    Period  Weeks    Status  New      PT LONG TERM GOAL #4   Title  improve FOTO =/< 415 limited ( 12/21/18)     Time  6    Period  Weeks    Status  New    Target Date  12/21/18      PT LONG TERM GOAL #5    Title  demo strong contraction of bilat mulitifidi to support spine during core work ( 12/21/18)     Time  6    Period  Weeks    Status  New    Target Date  12/21/18            Plan - 01/01/19 1140    Clinical Impression Statement  Extending POC to end of the month due to flare up of concordant pain. Utilized DN today which resolved discomfort and discussed difference in symptoms with trigger point referred pain vs radicular pain. Asked her to just do elliptcal one day to see if it causes issues because form was good with squats and crunches. Would like intro to pilates for continued care.     PT Treatment/Interventions  Dry needling;Neuromuscular re-education;Spinal Manipulations;Manual techniques;Moist Heat;Traction;Patient/family education;Taping;Therapeutic exercise;Ultrasound;Electrical Stimulation;Passive range of motion    PT Next Visit Plan  reformer- would like to transition to pilates, outcome of DN?    PT Home Exercise Plan  core contractions, hundreds, squat + core.    Consulted and Agree with Plan of Care  Patient       Patient will benefit from skilled therapeutic intervention in order to improve the following deficits and impairments:  Pain, Increased muscle spasms, Decreased range of motion, Decreased strength  Visit Diagnosis: Chronic bilateral low back pain with bilateral sciatica - Plan: PT plan of care cert/re-cert  Muscle spasm of back - Plan: PT plan of care cert/re-cert  Muscle weakness (generalized) - Plan: PT plan of care cert/re-cert     Problem List Patient Active Problem List   Diagnosis Date Noted  . Chronic lumbar pain 09/27/2018  . CIN III (cervical intraepithelial neoplasia grade III) with severe dysplasia 11/18/2016  . Abnormal Papanicolaou smear of cervix 10/15/2016  . Urticaria 03/16/2016  . Morbid obesity ([place]) 07/04/2015  . Anxiety 06/30/2015  . ADD (attention deficit disorder) 07/20/2014   Kariyah Baugh C. Cheyann Blecha PT, DPT 01/01/19 11:46  AM   [facility]  [place], [place], [address] Phone: [phone]   Fax:  [phone]  Name: Marissa Burnett MRN: [id number] Date of Birth: [birth year]  PHYSICAL THERAPY DISCHARGE SUMMARY  Visits from Start of Care: 3  Current functional level related to goals / functional outcomes: See above   Remaining deficits: See above   Education / Equipment: Anatomy of condition, POC, HEP, exercise form/rationale  Plan: Patient agrees to discharge.  Patient goals were not met. Patient is being discharged due to not returning since the last visit.  ?????     Terriann Difonzo C. Dhiren Azimi PT, DPT 02/10/19 12:59 PM

## 2019-01-06 ENCOUNTER — Ambulatory Visit: Payer: No Typology Code available for payment source | Admitting: Physical Therapy

## 2019-01-06 ENCOUNTER — Other Ambulatory Visit: Payer: Self-pay | Admitting: Family Medicine

## 2019-01-07 ENCOUNTER — Encounter: Payer: Self-pay | Admitting: Family Medicine

## 2019-01-07 NOTE — Telephone Encounter (Signed)
Refill x 2 

## 2019-01-22 ENCOUNTER — Ambulatory Visit: Payer: No Typology Code available for payment source | Admitting: Physical Therapy

## 2019-02-08 ENCOUNTER — Ambulatory Visit (INDEPENDENT_AMBULATORY_CARE_PROVIDER_SITE_OTHER): Payer: No Typology Code available for payment source | Admitting: Family Medicine

## 2019-02-08 ENCOUNTER — Encounter: Payer: Self-pay | Admitting: Family Medicine

## 2019-02-08 VITALS — BP 110/70 | Temp 97.7°F | Ht 64.0 in

## 2019-02-08 DIAGNOSIS — J111 Influenza due to unidentified influenza virus with other respiratory manifestations: Secondary | ICD-10-CM | POA: Diagnosis not present

## 2019-02-08 MED ORDER — OSELTAMIVIR PHOSPHATE 75 MG PO CAPS: 75.0000 mg | ORAL_CAPSULE | Freq: Two times a day (BID) | ORAL | 0 refills | Status: AC

## 2019-02-08 NOTE — Progress Notes (Signed)
   Subjective:    Patient ID: Marissa Burnett, female    DOB: 05-09-1986, 33 y.o.   MRN: [id number]  HPI  Patient is here today with complaints of a productive cough at times with thick yellow/tan,sticky. Also having a headache,sore throat,fever,chills,hotflashes ongoing for the last two days.  Sore throat x 4 days, low grade fever next day with h/a and fatigue. Yesterday having chills and cough with thick mucous. Started Tamiflu 3 days ago that she had leftover, bid the last two days and 1 tablet this morning. States she is out of it now.   Today first day without fever.   Review of Systems  Constitutional: Positive for chills and fever.  HENT: Positive for congestion and sore throat. Negative for ear pain and sinus pressure.   Respiratory: Positive for cough. Negative for shortness of breath and wheezing.   Gastrointestinal: Negative for diarrhea, nausea and vomiting.       Objective:   Physical Exam Vitals signs and nursing note reviewed.  Constitutional:      General: She is not in acute distress.    Appearance: Normal appearance. She is not toxic-appearing.  HENT:     Head: Normocephalic and atraumatic.     Right Ear: Tympanic membrane normal.     Left Ear: Tympanic membrane normal.     Nose: Congestion present.     Right Sinus: No maxillary sinus tenderness or frontal sinus tenderness.     Left Sinus: No maxillary sinus tenderness or frontal sinus tenderness.     Mouth/Throat:     Mouth: Mucous membranes are moist.     Pharynx: Oropharynx is clear.  Eyes:     General:        Right eye: No discharge.        Left eye: No discharge.  Neck:     Musculoskeletal: Neck supple. No neck rigidity.  Cardiovascular:     Rate and Rhythm: Normal rate and regular rhythm.     Heart sounds: Normal heart sounds.  Pulmonary:     Effort: Pulmonary effort is normal. No respiratory distress.     Breath sounds: Normal breath sounds. No wheezing or rales.  Lymphadenopathy:   Cervical: No cervical adenopathy.  Skin:    General: Skin is warm and dry.  Neurological:     Mental Status: She is alert and oriented to person, place, and time.           Assessment & Plan:  Influenza  Discussed with patient likely influenza. Recommend finishing out five day course of tamiflu, will prescribe. Symptomatic care discussed, pt has benzonatate at home, states was helpful to her previously will start taking prn for cough. Warning signs discussed. If no improvement or worsening over the next several days she should f/u.

## 2019-02-08 NOTE — Patient Instructions (Addendum)
Finish 5 day course of tamiflu, so take today, tomorrow and Wednesday Influenza, Adult Influenza, more commonly known as "the flu," is a viral infection that mainly affects the respiratory tract. The respiratory tract includes organs that help you breathe, such as the lungs, nose, and throat. The flu causes many symptoms similar to the common cold along with high fever and body aches. The flu spreads easily from person to person (is contagious). Getting a flu shot (influenza vaccination) every year is the best way to prevent the flu. What are the causes? This condition is caused by the influenza virus. You can get the virus by:  Breathing in droplets that are in the air from an infected person's cough or sneeze.  Touching something that has been exposed to the virus (has been contaminated) and then touching your mouth, nose, or eyes. What increases the risk? The following factors may make you more likely to get the flu:  Not washing or sanitizing your hands often.  Having close contact with many people during cold and flu season.  Touching your mouth, eyes, or nose without first washing or sanitizing your hands.  Not getting a yearly (annual) flu shot. You may have a higher risk for the flu, including serious problems such as a lung infection (pneumonia), if you:  Are older than 65.  Are pregnant.  Have a weakened disease-fighting system (immune system). You may have a weakened immune system if you: ? Have HIV or AIDS. ? Are undergoing chemotherapy. ? Are taking medicines that reduce (suppress) the activity of your immune system.  Have a long-term (chronic) illness, such as heart disease, kidney disease, diabetes, or lung disease.  Have a liver disorder.  Are severely overweight (morbidly obese).  Have anemia. This is a condition that affects your red blood cells.  Have asthma. What are the signs or symptoms? Symptoms of this condition usually begin suddenly and last 4-14  days. They may include:  Fever and chills.  Headaches, body aches, or muscle aches.  Sore throat.  Cough.  Runny or stuffy (congested) nose.  Chest discomfort.  Poor appetite.  Weakness or fatigue.  Dizziness.  Nausea or vomiting. How is this diagnosed? This condition may be diagnosed based on:  Your symptoms and medical history.  A physical exam.  Swabbing your nose or throat and testing the fluid for the influenza virus. How is this treated? If the flu is diagnosed early, you can be treated with medicine that can help reduce how severe the illness is and how long it lasts (antiviral medicine). This may be given by mouth (orally) or through an IV. Taking care of yourself at home can help relieve symptoms. Your health care provider may recommend:  Taking over-the-counter medicines.  Drinking plenty of fluids. In many cases, the flu goes away on its own. If you have severe symptoms or complications, you may be treated in a hospital. Follow these instructions at home: Activity  Rest as needed and get plenty of sleep.  Stay home from work or school as told by your health care provider. Unless you are visiting your health care provider, avoid leaving home until your fever has been gone for 24 hours without taking medicine. Eating and drinking  Take an oral rehydration solution (ORS). This is a drink that is sold at pharmacies and retail stores.  Drink enough fluid to keep your urine pale yellow.  Drink clear fluids in small amounts as you are able. Clear fluids include water, ice chips,  diluted fruit juice, and low-calorie sports drinks.  Eat bland, easy-to-digest foods in small amounts as you are able. These foods include bananas, applesauce, rice, lean meats, toast, and crackers.  Avoid drinking fluids that contain a lot of sugar or caffeine, such as energy drinks, regular sports drinks, and soda.  Avoid alcohol.  Avoid spicy or fatty foods. General  instructions      Take over-the-counter and prescription medicines only as told by your health care provider.  Use a cool mist humidifier to add humidity to the air in your home. This can make it easier to breathe.  Cover your mouth and nose when you cough or sneeze.  Wash your hands with soap and water often, especially after you cough or sneeze. If soap and water are not available, use alcohol-based hand sanitizer.  Keep all follow-up visits as told by your health care provider. This is important. How is this prevented?   Get an annual flu shot. You may get the flu shot in late summer, fall, or winter. Ask your health care provider when you should get your flu shot.  Avoid contact with people who are sick during cold and flu season. This is generally fall and winter. Contact a health care provider if:  You develop new symptoms.  You have: ? Chest pain. ? Diarrhea. ? A fever.  Your cough gets worse.  You produce more mucus.  You feel nauseous or you vomit. Get help right away if:  You develop shortness of breath or difficulty breathing.  Your skin or nails turn a bluish color.  You have severe pain or stiffness in your neck.  You develop a sudden headache or sudden pain in your face or ear.  You cannot eat or drink without vomiting. Summary  Influenza, more commonly known as "the flu," is a viral infection that primarily affects your respiratory tract.  Symptoms of the flu usually begin suddenly and last 4-14 days.  Getting an annual flu shot is the best way to prevent getting the flu.  Stay home from work or school as told by your health care provider. Unless you are visiting your health care provider, avoid leaving home until your fever has been gone for 24 hours without taking medicine.  Keep all follow-up visits as told by your health care provider. This is important. This information is not intended to replace advice given to you by your health care  provider. Make sure you discuss any questions you have with your health care provider. Document Released: 12/13/2000 Document Revised: 06/03/2018 Document Reviewed: 06/03/2018 Elsevier Interactive Patient Education  2019 [employer].

## 2019-03-23 ENCOUNTER — Other Ambulatory Visit: Payer: Self-pay | Admitting: Adult Health

## 2019-03-23 MED FILL — OMEPRAZOLE 20 MG CPDR: 20 | 30 days supply | Qty: 30 | Fill #1 | Status: TO

## 2019-03-31 ENCOUNTER — Encounter: Payer: Self-pay | Admitting: Family Medicine

## 2019-04-01 ENCOUNTER — Ambulatory Visit (INDEPENDENT_AMBULATORY_CARE_PROVIDER_SITE_OTHER): Payer: No Typology Code available for payment source | Admitting: Family Medicine

## 2019-04-01 ENCOUNTER — Encounter: Payer: Self-pay | Admitting: Family Medicine

## 2019-04-01 ENCOUNTER — Other Ambulatory Visit: Payer: Self-pay

## 2019-04-01 DIAGNOSIS — F419 Anxiety disorder, unspecified: Secondary | ICD-10-CM | POA: Diagnosis not present

## 2019-04-01 MED ORDER — ALPRAZOLAM 0.5 MG PO TABS: ORAL_TABLET | ORAL | 4 refills | Status: DC

## 2019-04-01 MED ORDER — FLUOXETINE HCL 10 MG PO CAPS: 10.0000 mg | ORAL_CAPSULE | Freq: Every day | ORAL | 4 refills | Status: DC

## 2019-04-01 NOTE — Progress Notes (Signed)
Subjective:    Patient ID: Marissa Burnett, female    DOB: 1986/04/18, 33 y.o.   MRN: [id number]  HPIcough started Sunday. Thinks its allergies. Also has reflux the cough could be from. Also had sore throat off and on. Worse in mornings and nights. 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 Has been taking her temp every day and no fever. Has had some sob but she thinks its from anxiety. She was just on the ellipitcal machine for 22mins and did fine.  Patient denies any body aches fever chills sweats  Anxiety off and on for years. Started back around October and was doing well with it until the covid 19. Takes xanax 0.5mg  just as needed. She took a half of one yesterday.  Patient has significant amount of anxiety related issues.  She is under a lot of stress she is a respiratory therapist works at [employer] is around a lot of coronavirus patients.  Unfortunately protective equipment has been in short supply which causes some compromise and protection.  This is causing significant anxiety for the patient.  Patient has a long history of anxiety and panic related attacks.  She is trying to do the best she can and she desires to keep working but it has been very stressful  Virtual Visit via Telephone Note Video conference was held with patient I connected with Marissa Burnett on 04/01/19 at  3:00 PM EDT by telephone and verified that I am speaking with the correct person using two identifiers.   I discussed the limitations, risks, security and privacy concerns of performing an evaluation and management service by telephone and the availability of in person appointments. I also discussed with the patient that there may be a patient responsible charge related to this service. The patient expressed understanding and agreed to proceed.   History of Present Illness:    Observations/Objective:   Assessment and Plan:   Follow Up Instructions:     I discussed the assessment and treatment plan with the patient. The patient was provided an opportunity to ask questions and all were answered. The patient agreed with the plan and demonstrated an understanding of the instructions.   The patient was advised to call back or seek an in-person evaluation if the symptoms worsen or if the condition fails to improve as anticipated.  I provided 18 minutes of non-face-to-face time during this encounter.   Dayton Bailiff, LPN     Review of Systems     Objective:   Physical Exam  Physical exam not possible      Assessment & Plan:  Anxiety Occasional panic attack Xanax on a as needed basis I would recommend Xanax only be for home use Because of significant amount of stress she is under I recommend Prozac We did discuss how Celexa caused some feelings of being aloof And sertraline had side effects therefore she would like to try something different We will go with Prozac 10 mg 1 daily she will give [place] feedback in the next 10 to 14 days how she is doing If her anxiety gets a lot worse she may have to come out on FMLA In my opinion this patient has underlying anxiety disorder that is being made worse by the current health situation with coronavirus. I do feel that the patient is motivated to work but she is under a significant amount of stress Once again she will give [place]  feedback within the next 10 to 14 days

## 2019-04-19 ENCOUNTER — Ambulatory Visit (INDEPENDENT_AMBULATORY_CARE_PROVIDER_SITE_OTHER): Payer: No Typology Code available for payment source | Admitting: Family Medicine

## 2019-04-19 ENCOUNTER — Other Ambulatory Visit: Payer: Self-pay

## 2019-04-19 DIAGNOSIS — R0781 Pleurodynia: Secondary | ICD-10-CM

## 2019-04-20 NOTE — Progress Notes (Signed)
15-minute discussion with the patient Video and audio Patient present at work I was present at the office Coronavirus outbreak  Patient having some sharp pains on the left side of her chest also pain in the left flank region denies radiation into the abdomen or into the groin denies any hematuria denies sweats chills finds her self feeling stressed.  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  She denies any cough fevers chills body aches.  I suspect what is going on with patient is a little bit of possible atelectasis with altered breathing causing some dizziness related to the mask she is wearing all the time currently she is doing better she can keep working but she understands at some point time she may have to come out from work if it gets too stressful  Hold off on any x-rays or lab work at this time I do not feel she needs any type of antibiotics.  Breathing out through pursed lips can help.  If it gets worse she is to notify us.

## 2019-05-04 MED FILL — OMEPRAZOLE 20 MG CPDR: 20 | 30 days supply | Qty: 30 | Fill #0

## 2019-06-14 MED FILL — OMEPRAZOLE 20 MG CPDR: 20 | 30 days supply | Qty: 30 | Fill #0

## 2019-07-01 ENCOUNTER — Other Ambulatory Visit: Payer: Self-pay

## 2019-07-01 ENCOUNTER — Ambulatory Visit (INDEPENDENT_AMBULATORY_CARE_PROVIDER_SITE_OTHER): Payer: No Typology Code available for payment source | Admitting: Family Medicine

## 2019-07-01 DIAGNOSIS — K2100 Gastro-esophageal reflux disease with esophagitis, without bleeding: Secondary | ICD-10-CM | POA: Insufficient documentation

## 2019-07-01 MED ORDER — PANTOPRAZOLE SODIUM 40 MG PO TBEC: 40.0000 mg | DELAYED_RELEASE_TABLET | Freq: Every day | ORAL | 1 refills | Status: DC

## 2019-07-01 MED FILL — PANTOPRAZOLE SOD DR 40 MG T: 40 | 90 days supply | Qty: 90 | Fill #0

## 2019-07-01 NOTE — Progress Notes (Signed)
   Subjective:    Patient ID: Marissa Burnett, female    DOB: 25-Jun-1986, 32 y.o.   MRN: [id number]  HPI Patient calls requesting referral to GI doctor for ongoing reflux and bloating when eating-especially hot spicy food. Patient relates a lot of reflux symptoms burning in the esophagus denies high fever chills denies wheezing difficulty breathing no COVID symptoms.  Stress levels overall are doing fairly well denies dysphasia Virtual Visit via Video Note  I connected with Marissa Burnett on 07/01/19 at 11:00 AM EDT by a video enabled telemedicine application and verified that I am speaking with the correct person using two identifiers.  Location: Patient: home Provider: office   I discussed the limitations of evaluation and management by telemedicine and the availability of in person appointments. The patient expressed understanding and agreed to proceed.  History of Present Illness:    Observations/Objective:   Assessment and Plan:   Follow Up Instructions:    I discussed the assessment and treatment plan with the patient. The patient was provided an opportunity to ask questions and all were answered. The patient agreed with the plan and demonstrated an understanding of the instructions.   The patient was advised to call back or seek an in-person evaluation if the symptoms worsen or if the condition fails to improve as anticipated.  I provided 15 minutes of non-face-to-face time during this encounter.      Review of Systems  Constitutional: Negative for activity change and appetite change.  HENT: Negative for congestion and rhinorrhea.   Respiratory: Negative for cough and shortness of breath.   Cardiovascular: Negative for chest pain and leg swelling.  Gastrointestinal: Negative for abdominal pain, nausea and vomiting.  Skin: Negative for color change.  Neurological: Negative for dizziness and weakness.  Psychiatric/Behavioral: Negative for agitation and confusion.        Objective:   Physical Exam   Today's visit was via telephone Physical exam was not possible for this visit     Assessment & Plan:  Significant GERD symptoms initiate medication dietary measures discussed daily head of bed on 4 inch blocks referral to gastroenterology given the months that she has had this she would benefit from EGD and further evaluation

## 2019-07-08 ENCOUNTER — Encounter: Payer: Self-pay | Admitting: Family Medicine

## 2019-07-15 IMAGING — DX DG LUMBAR SPINE COMPLETE 4+V
5 series · 5 of 5 positions shown · non-contrast
Comparison: None.

CLINICAL DATA: Persistent left hip and low back pain

EXAM:
LUMBAR SPINE - COMPLETE 4+ VIEW

[l-spine ap]
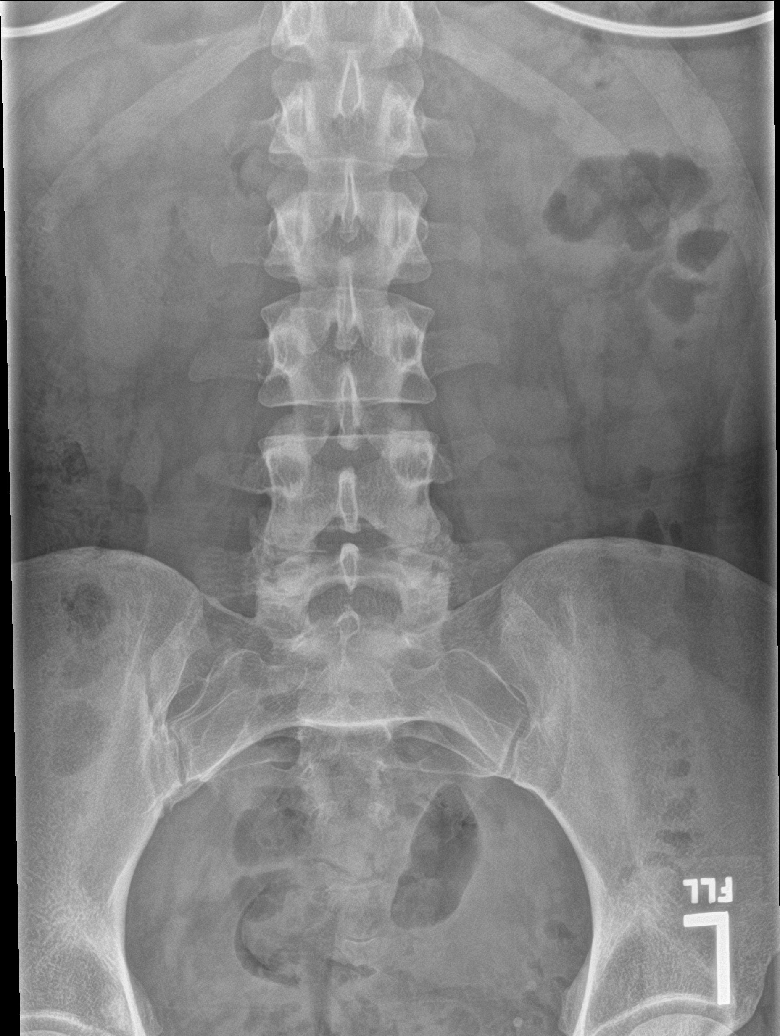

[l-spine obl (1 of 2)]
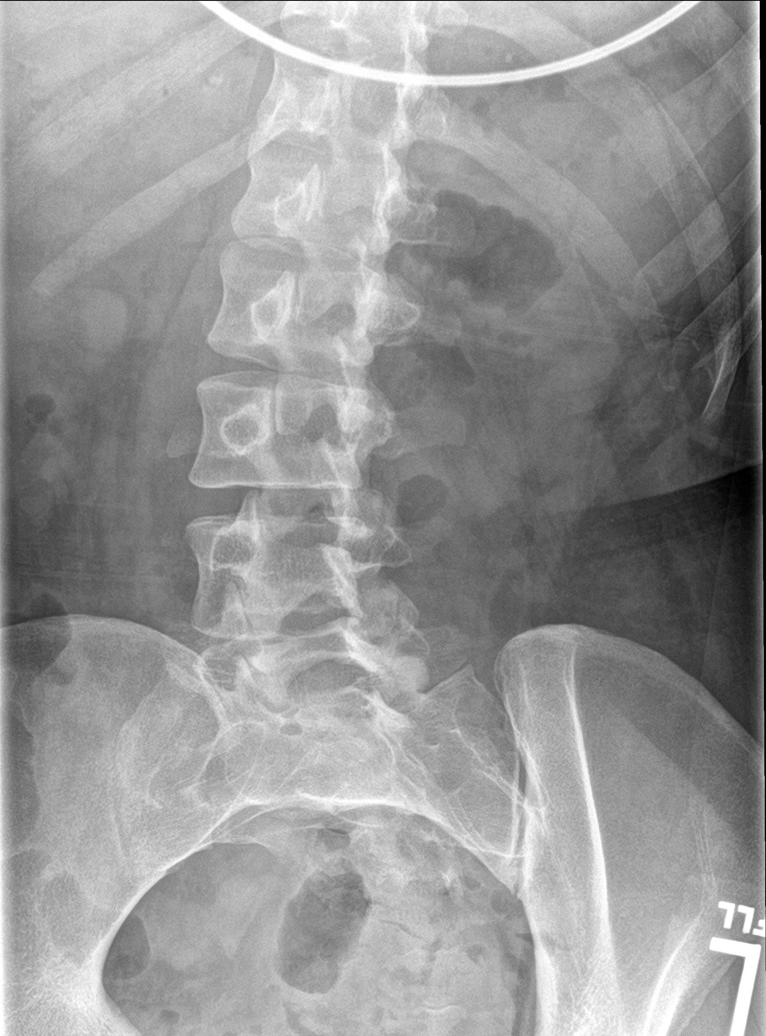

[l-spine obl (2 of 2)]
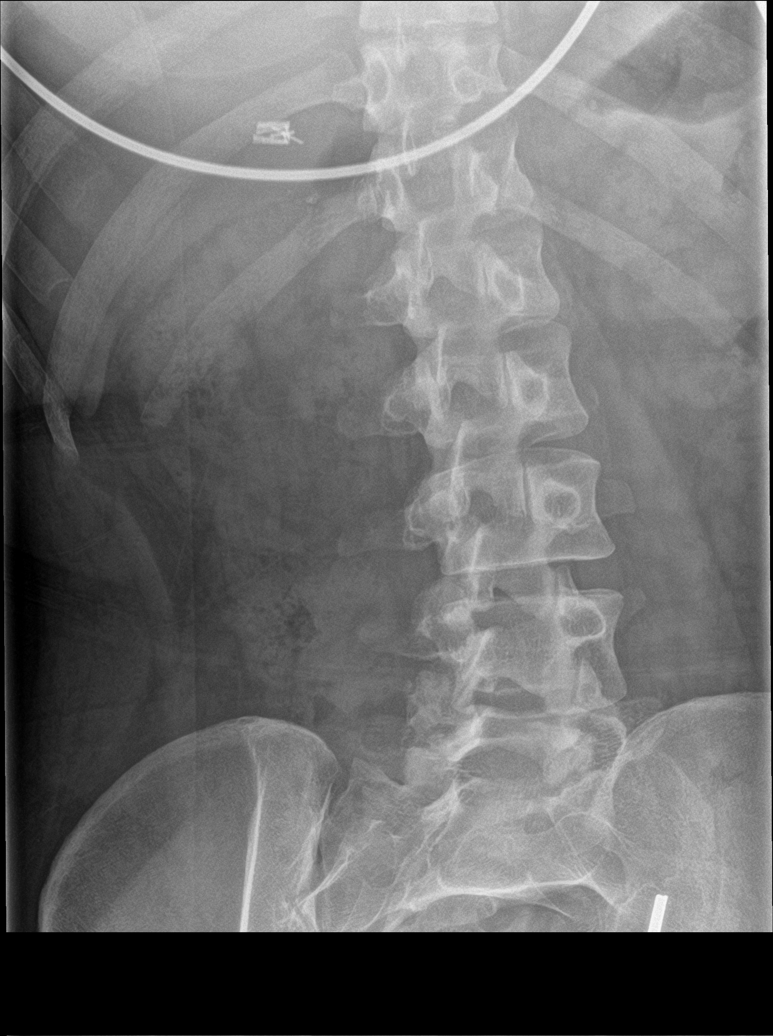

[l-spine lat]
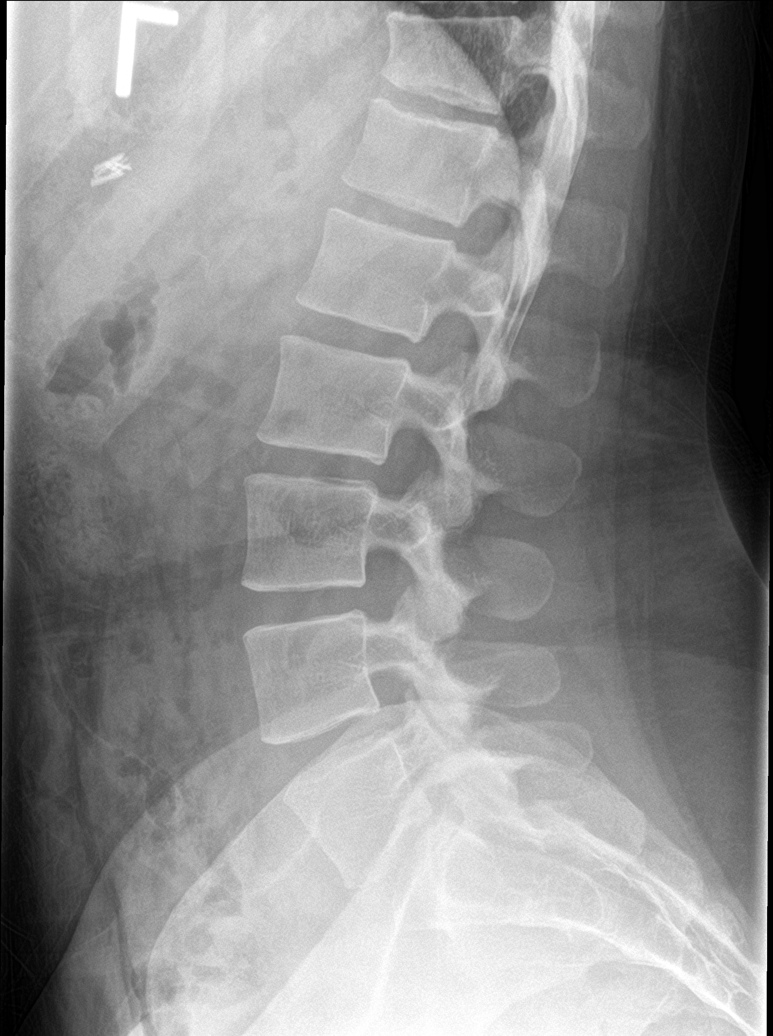

[l-spine spot]
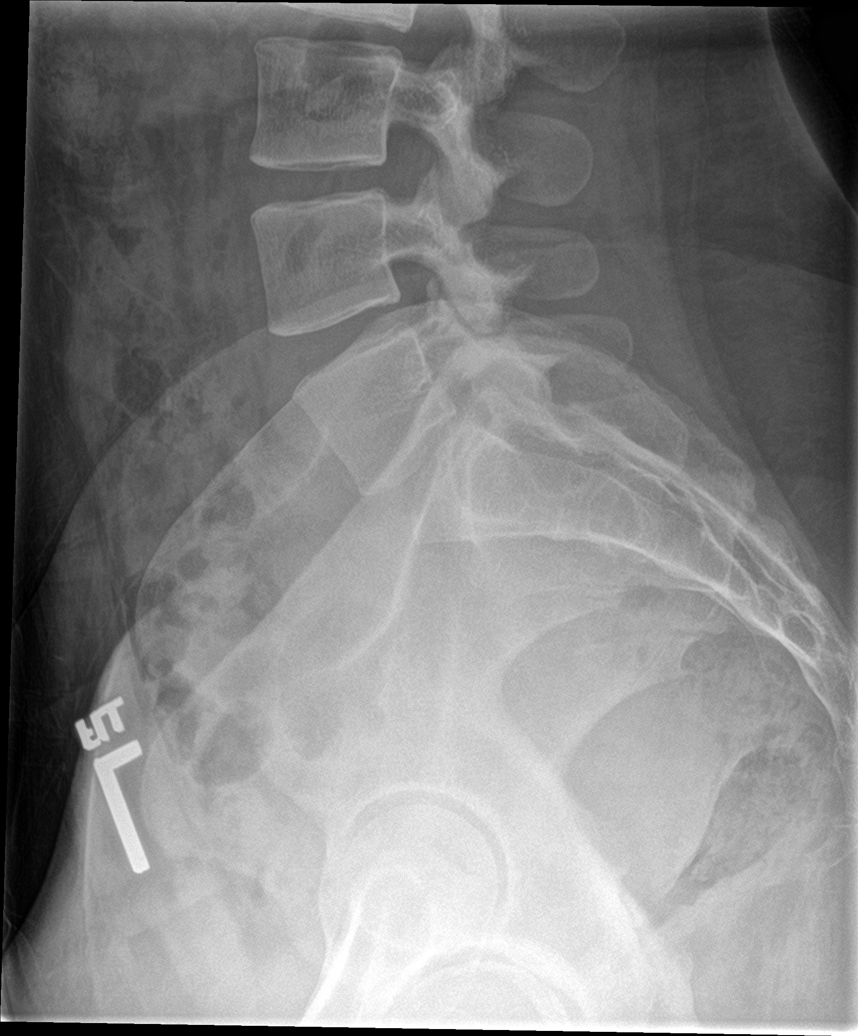

[5 of 5 positions shown; findings below may reference images not displayed]

FINDINGS: Osseous alignment is normal. No acute or suspicious osseous lesion.
No fracture line or displaced fracture fragment. Disc spaces appear
well preserved. Visualized paravertebral soft tissues are
unremarkable. Cholecystectomy clips noted in the right upper
quadrant.

There is a mild wedge compression deformity of the T11 vertebral
body, not significantly changed compared to an earlier chest x-ray
of 01/29/2006 indicating it is chronic/physiologic.
IMPRESSION: Negative.

## 2019-07-19 MED FILL — CEPHALEXIN 500 MG CAPSULE: 500 | 5 days supply | Qty: 15 | Fill #0

## 2019-07-19 MED FILL — HYDROCODON-APAP 5-325: 5-325 | 4 days supply | Qty: 25 | Fill #0

## 2019-07-19 MED FILL — PROMETHAZINE 12.5 MG TABLET: 12.5 | 2 days supply | Qty: 10 | Fill #0

## 2019-08-17 ENCOUNTER — Ambulatory Visit (INDEPENDENT_AMBULATORY_CARE_PROVIDER_SITE_OTHER): Payer: No Typology Code available for payment source | Admitting: Internal Medicine

## 2019-08-17 ENCOUNTER — Encounter: Payer: Self-pay | Admitting: Internal Medicine

## 2019-08-17 ENCOUNTER — Other Ambulatory Visit: Payer: Self-pay

## 2019-08-17 VITALS — BP 106/70 | HR 88 | Temp 98.3°F | Ht 64.0 in | Wt 167.4 lb

## 2019-08-17 DIAGNOSIS — K219 Gastro-esophageal reflux disease without esophagitis: Secondary | ICD-10-CM

## 2019-08-17 DIAGNOSIS — R142 Eructation: Secondary | ICD-10-CM

## 2019-08-17 NOTE — Patient Instructions (Signed)
Stay on your pantoprazole per Dr Carlean Purl.    We are giving you a handout to read on gas and flatulence, focus on the belching part.   Follow up as needed with Dr Carlean Purl, MD, [facility]    I appreciate the opportunity to care for you. Silvano Rusk, MD, [facility]

## 2019-08-17 NOTE — Progress Notes (Signed)
Marissa Burnett 33 y.o. [birth year] [id number] Referred by: [name], MD  Assessment & Plan:   Encounter Diagnoses  Name Primary?  . Gastroesophageal reflux disease, esophagitis presence not specified Yes  . Belching     We discussed PPI use, it sounds like this is working for her and if she does not take it she has symptoms.  Since she responds I think that is confirmatory of her diagnosis.  I do not think an EGD is necessary, it could be done but after discussing with her she and I are comfortable without that and she will continue PPI regularly.  I explained how there are associations with certain problems like dementia kidney failure and bone fractures those are weak associations at best and not entirely consistent and other studies.  We also discussed the possibility of surgery or TIF procedure to treat reflux but she prefers medical therapy at this time.  She will continue lifestyle changes.   Regarding the belching, she may have a weak lower esophageal sphincter or perhaps even a hiatal hernia that impairs that.  I do not think this is particularly pathologic it is more of a quality of life and social issues.  I have given her a handout about gas and belching prevention to try to look at that and see if she can modify diet further or take other measures to reduce belching.  One thing I did not discuss when she was in the office but will message her about is that at some point we could try reducing to 20 mg pantoprazole to see if that works to get her to the lowest effective dose.  I will see her back as needed.  I appreciate the opportunity to care for this patient. 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  Postprandial bloating also at times.  She also complains of "really loud belching",  reporting that her husband says "that cannot be normal".  Primary care provider Dr. Wolfgang Phoenix has put her on pantoprazole and things are much better.  Diet changes and other measures did not control it she wonders if she needs evaluation with EGD and so did Dr. Wolfgang Phoenix.  There is no dysphagia unintentional weight loss or significant abdominal pain.  She seems to be fairly compliant with a reflux diet.  Limits caffeine and other triggers such as spicy foods.  Does not seem to excessively ingest gas-forming foods.  GI review of systems is otherwise negative Allergies  Allergen Reactions  . Peanut-Containing Drug Products Anaphylaxis    THROAT GETS TIGHT, peanut butter   Current Meds  Medication Sig  . ALPRAZolam (XANAX) 0.5 MG tablet TAKE 1 TABLET BY MOUTH TWICE DAILY AS NEEDED FOR ANXIETY  . BIOTIN PO Take 1 tablet by mouth daily.  Marland Kitchen loratadine (CLARITIN) 10 MG tablet Take 10 mg by mouth daily.  . pantoprazole (PROTONIX) 40 MG tablet Take 1 tablet (40 mg total) by mouth daily.   Past Medical History:  Diagnosis Date  . Abnormal Papanicolaou smear of cervix 10/15/2016   LSIL+HPV  . Allergy   . Anxiety   . Chronic lumbar pain 09/27/2018   Patient has seen Dr. Arnoldo Morale neurosurgery September 2019-they do not feel there is anything surgical that can help-they are recommending injections with pain medicine specialist  . Dysrhythmia  history of palpitations   . Gallstones   . GERD (gastroesophageal reflux disease)   . Heart murmur birth   Past Surgical History:  Procedure Laterality Date  . ABDOMINOPLASTY    . BREAST ENHANCEMENT SURGERY    . CERVICAL CONIZATION W/BX N/A 12/13/2016   Procedure: CONIZATION CERVIX WITH BIOPSY--cold knife conization;  Surgeon: Jonnie Kind, MD;  Location: [facility];  Service: Gynecology;  Laterality: N/A;  . CHOLECYSTECTOMY  02/2002  . ENDOSCOPIC RETROGRADE CHOLANGIOPANCREATOGRAPHY (ERCP) WITH PROPOFOL    . OTHER SURGICAL HISTORY     stent in kidney during  pregnancy,2002; removed in 2003   Social History   Social History Narrative   Married, respiratory therapist [facility]   2 children   Occasional alcohol, never smoker no drug use   Previous work Personnel officer   family history includes ADD / ADHD in her daughter; Anxiety disorder in her daughter; COPD in her maternal grandfather; Colon polyps in her paternal grandmother; Diabetes in her maternal grandmother and another family member; Heart disease in her paternal grandmother; Irritable bowel syndrome in her paternal grandmother; Lung cancer in her paternal grandfather.   Review of Systems As per HPI.  Some chronic intermittent low back pain issues.  Objective:   Physical Exam @BP  106/70 (BP Location: Left Arm, Patient Position: Sitting, Cuff Size: Normal)   Pulse 88   Temp 98.3 F (36.8 C)   Ht 5\' 4"  (1.626 m) Comment: height measured without shoes  Wt 167 lb 6 oz (75.9 kg)   LMP 08/10/2019   BMI 28.73 kg/m @  General:  Well-developed, well-nourished and in no acute distress Eyes:  anicteric. ENT:   Mouth and posterior pharynx free of lesions.  Neck:   supple w/o thyromegaly or mass.  Lungs: Clear to auscultation bilaterally. Heart:  S1S2, no rubs, murmurs, gallops. Abdomen:  soft, non-tender, no hepatosplenomegaly, hernia, or mass and BS+.  Lymph:  no cervical or supraclavicular adenopathy. Extremities:   no edema, cyanosis or clubbing Skin   no rash. Some tattoos Neuro:  A&O x 3.  Psych:  appropriate mood and  Affect.   Data Reviewed: Primary care notes, labs imaging in the EMR

## 2019-08-21 ENCOUNTER — Encounter: Payer: Self-pay | Admitting: Internal Medicine

## 2019-08-24 ENCOUNTER — Encounter: Payer: Self-pay | Admitting: Family Medicine

## 2019-08-26 NOTE — Telephone Encounter (Signed)
Nurses-please forward this to the patient    I certainly understand the request and certainly sympathize.  But we are unable to do this currently.  When restrictions ease we certainly support the use of gyms  Dear patient,  We are currently not providing certifications for indoor gym use.  We do not come to this conclusion lightly.  It is our desire to operate from a standpoint of safety for our patients as well as those they come into contact with.  We certainly encourage regular physical activity but just not in a indoor gym environment during this phase of the pandemic restrictions.  We thank you for understanding this position. Sincerely, [place]

## 2019-10-28 MED FILL — PANTOPRAZOLE SOD DR 40 MG T: 40 | 90 days supply | Qty: 90 | Fill #1

## 2019-11-19 ENCOUNTER — Other Ambulatory Visit: Payer: No Typology Code available for payment source | Admitting: Obstetrics and Gynecology

## 2019-11-24 ENCOUNTER — Ambulatory Visit (INDEPENDENT_AMBULATORY_CARE_PROVIDER_SITE_OTHER): Payer: No Typology Code available for payment source | Admitting: Obstetrics and Gynecology

## 2019-11-24 ENCOUNTER — Other Ambulatory Visit: Payer: Self-pay

## 2019-11-24 ENCOUNTER — Encounter: Payer: Self-pay | Admitting: Obstetrics and Gynecology

## 2019-11-24 ENCOUNTER — Other Ambulatory Visit (HOSPITAL_COMMUNITY)
Admission: RE | Admit: 2019-11-24 | Discharge: 2019-11-24 | Disposition: A | Discharge status: Home / Self Care | Payer: No Typology Code available for payment source | Source: Ambulatory Visit | Attending: Obstetrics and Gynecology | Admitting: Obstetrics and Gynecology

## 2019-11-24 VITALS — BP 118/85 | Ht 64.0 in | Wt 167.4 lb

## 2019-11-24 DIAGNOSIS — Z01419 Encounter for gynecological examination (general) (routine) without abnormal findings: Secondary | ICD-10-CM | POA: Insufficient documentation

## 2019-11-24 DIAGNOSIS — Z1272 Encounter for screening for malignant neoplasm of vagina: Secondary | ICD-10-CM | POA: Diagnosis present

## 2019-11-24 NOTE — Progress Notes (Addendum)
Patient ID: Marissa Burnett, female   DOB: Jun 11, 1986, 33 y.o.   MRN: [id number]  Assessment:  Annual Gyn Exam Hx CINIII Plan:  1. pap smear done, next pap due 1 year 2. return annually or prn 3    Annual mammogram advised after age 5 Subjective:  Marissa Burnett is a 33 y.o. female [phone] who presents for annual exam. No LMP recorded. The patient has complaints today of none. She is s/p conization 12/13/2016. Dr. Charma Igo  Recently re did her breast implants.  The following portions of the patient's history were reviewed and updated as appropriate: allergies, current medications, past family history, past medical history, past social history, past surgical history and problem list. Past Medical History:  Diagnosis Date  . Abnormal Papanicolaou smear of cervix 10/15/2016   LSIL+HPV  . Allergy   . Anxiety   . Chronic lumbar pain 09/27/2018   Patient has seen Dr. Arnoldo Morale neurosurgery September 2019-they do not feel there is anything surgical that can help-they are recommending injections with pain medicine specialist  . Dysrhythmia    history of palpitations   . Gallstones   . GERD (gastroesophageal reflux disease)   . Heart murmur birth    Past Surgical History:  Procedure Laterality Date  . ABDOMINOPLASTY    . BREAST ENHANCEMENT SURGERY    . CERVICAL CONIZATION W/BX N/A 12/13/2016   Procedure: CONIZATION CERVIX WITH BIOPSY--cold knife conization;  Surgeon: Jonnie Kind, MD;  Location: [facility];  Service: Gynecology;  Laterality: N/A;  . CHOLECYSTECTOMY  02/2002  . ENDOSCOPIC RETROGRADE CHOLANGIOPANCREATOGRAPHY (ERCP) WITH PROPOFOL    . OTHER SURGICAL HISTORY     stent in kidney during pregnancy,2002; removed in 2003     Current Outpatient Medications:  .  ALPRAZolam (XANAX) 0.5 MG tablet, TAKE 1 TABLET BY MOUTH TWICE DAILY AS NEEDED FOR ANXIETY, Disp: 30 tablet, Rfl: 4 .  BIOTIN PO, Take 1 tablet by mouth daily., Disp: , Rfl:  .  loratadine (CLARITIN) 10 MG  tablet, Take 10 mg by mouth daily., Disp: , Rfl:  .  pantoprazole (PROTONIX) 40 MG tablet, Take 1 tablet (40 mg total) by mouth daily., Disp: 90 tablet, Rfl: 1  Review of Systems Constitutional: negative Gastrointestinal: negative Genitourinary: normal  Objective:  There were no vitals taken for this visit.   BMI: There is no height or weight on file to calculate BMI.  General Appearance: Alert, appropriate appearance for age. No acute distress HEENT: Grossly normal Neck / Thyroid:  Cardiovascular: RRR; normal S1, S2, no murmur Lungs: CTA bilaterally Back: No CVAT Breast Exam: No masses or nodes.No dimpling, nipple retraction or discharge. Gastrointestinal: Soft, non-tender, no masses or organomegaly Pelvic Exam: VAGINA:secretions look good.  CERVIX: healed well after conization UTERUS: uterus is normal and non-tender PAP: Pap smear done today. Lymphatic Exam: Non-palpable nodes in neck, clavicular, axillary, or inguinal regions Skin: no rash or abnormalities Neurologic: Normal gait and speech, no tremor  Psychiatric: Alert and oriented, appropriate affect.  Urinalysis:Not done  By signing my name below, I, Samul Dada, attest that this documentation has been prepared under the direction and in the presence of Jonnie Kind, MD. Electronically Signed: [place]. 11/24/19. 11:44 AM.  I personally performed the services described in this documentation, which was SCRIBED in my presence. The recorded information has been reviewed and considered accurate. It has been edited as necessary during review. Jonnie Kind, MD

## 2019-11-29 LAB — CYTOLOGY - PAP
Comment: NEGATIVE
Diagnosis: NEGATIVE
High risk HPV: NEGATIVE

## 2019-11-30 ENCOUNTER — Other Ambulatory Visit: Payer: Self-pay | Admitting: Family Medicine

## 2020-02-08 ENCOUNTER — Other Ambulatory Visit: Payer: Self-pay | Admitting: *Deleted

## 2020-02-08 MED ORDER — ALPRAZOLAM 0.5 MG PO TABS: 0.5000 mg | ORAL_TABLET | Freq: Two times a day (BID) | ORAL | 1 refills | Status: DC | PRN

## 2020-02-08 NOTE — Telephone Encounter (Signed)
May have this +1 refill needs virtual visit please pend

## 2020-02-08 NOTE — Telephone Encounter (Signed)
Scheduled 4/7

## 2020-02-15 ENCOUNTER — Other Ambulatory Visit: Payer: Self-pay | Admitting: Family Medicine

## 2020-02-15 MED FILL — PANTOPRAZOLE SOD DR 40 MG T: 40 | 90 days supply | Qty: 90 | Fill #0

## 2020-04-05 ENCOUNTER — Ambulatory Visit: Payer: No Typology Code available for payment source | Admitting: Family Medicine

## 2020-04-21 ENCOUNTER — Ambulatory Visit (INDEPENDENT_AMBULATORY_CARE_PROVIDER_SITE_OTHER): Payer: No Typology Code available for payment source | Admitting: Nurse Practitioner

## 2020-04-21 ENCOUNTER — Other Ambulatory Visit: Payer: Self-pay

## 2020-04-21 ENCOUNTER — Encounter: Payer: Self-pay | Admitting: Nurse Practitioner

## 2020-04-21 VITALS — BP 112/72 | Temp 97.4°F | Wt 169.2 lb

## 2020-04-21 DIAGNOSIS — I8390 Asymptomatic varicose veins of unspecified lower extremity: Secondary | ICD-10-CM | POA: Diagnosis not present

## 2020-04-21 NOTE — Progress Notes (Signed)
   Subjective:    Patient ID: Marissa Burnett, female    DOB: 1986/10/20, 34 y.o.   MRN: [id number]  HPI presents today for a referral to Vein specialist due to varicose veins. No pain or swelling. No family history of vascular disease. Not related to injury or trauma.     Review of Systems  Skin:       Reports vascular vein changes on bilateral anterior thighs extending down onto the anterior calf right below side of knee. Blue in color. Negative for any pain or swelling. Reports one area that is thicker and more pronounced on the left anterior calf which she reports swelling when she is in the sun.       Objective:   Physical Exam Constitutional:      Appearance: Normal appearance.  Cardiovascular:     Rate and Rhythm: Regular rhythm.  Pulmonary:     Effort: Pulmonary effort is normal.     Breath sounds: Normal breath sounds.  Skin:    General: Skin is warm and dry.     Comments: Reticular veins( blue) about 64mm in width on Bilateral anterior upper and lower thigh extending onto bilateral anterior calf. There is a thicker palpable area of veins of greater width on the left lower anterior calf.   Neurological:     Mental Status: She is alert.           Assessment & Plan:  Superficial varicosities - Plan: Ambulatory referral to Vascular Surgery  Discussed measures to help prevent further worsening of superficial varicosities.  Recheck here as needed.

## 2020-04-21 NOTE — Progress Notes (Signed)
   Subjective:    Patient ID: Marissa Burnett, female    DOB: 1986-02-09, 34 y.o.   MRN: [id number]  HPI  Patient arrives for a referral for varicose veins.  Review of Systems     Objective:   Physical Exam        Assessment & Plan:

## 2020-04-22 ENCOUNTER — Encounter: Payer: Self-pay | Admitting: Nurse Practitioner

## 2020-04-27 ENCOUNTER — Encounter: Payer: Self-pay | Admitting: Family Medicine

## 2020-04-28 ENCOUNTER — Telehealth: Payer: Self-pay | Admitting: *Deleted

## 2020-04-28 ENCOUNTER — Telehealth (INDEPENDENT_AMBULATORY_CARE_PROVIDER_SITE_OTHER): Payer: No Typology Code available for payment source | Admitting: Nurse Practitioner

## 2020-04-28 ENCOUNTER — Other Ambulatory Visit: Payer: Self-pay

## 2020-04-28 DIAGNOSIS — G4700 Insomnia, unspecified: Secondary | ICD-10-CM | POA: Diagnosis not present

## 2020-04-28 DIAGNOSIS — F419 Anxiety disorder, unspecified: Secondary | ICD-10-CM

## 2020-04-28 MED ORDER — ALPRAZOLAM 0.5 MG PO TABS: 0.5000 mg | ORAL_TABLET | Freq: Two times a day (BID) | ORAL | 0 refills | Status: DC | PRN

## 2020-04-28 MED ORDER — TRAZODONE HCL 50 MG PO TABS: 25.0000 mg | ORAL_TABLET | Freq: Every evening | ORAL | 0 refills | Status: DC | PRN

## 2020-04-28 NOTE — Progress Notes (Signed)
  PHONE VISIT Subjective:    Patient ID: Marissa Burnett, female    DOB: 03/28/1986, 34 y.o.   MRN: [id number]  HPI Patient calls in today for a follow up on her medications.  Patient is taking xanax as needed for anxiety.  Patient states she is doing well but her anxiety is bothering her at night. She states she is constantly thinking about everything she has to do the next day and concerns of having a teenager driving.  Virtual Visit via Video Note  I connected with Marissa Burnett on 04/28/20 at 2:40 PM EDT by a video enabled telemedicine application and verified that I am speaking with the correct person using two identifiers.  Location:  Patient: home Provider: office   I discussed the limitations of evaluation and management by telemedicine and the availability of in person appointments. The patient expressed understanding and agreed to proceed.  Main issue continues to be sleep. No relief with Melatonin. Tries to avoid taking Xanax. A 30 day supply lasts about 2-2 1/2 months. Currently has 12 left from last Rx. Has no plans for pregnancy.  Review of Systems GAD 7 : Generalized Anxiety Score 04/28/2020 11/19/2018  Nervous, Anxious, on Edge 1 2  Control/stop worrying 1 2  Worry too much - different things 1 2  Trouble relaxing 1 2  Restless 0 1  Easily annoyed or irritable 1 2  Afraid - awful might happen 1 2  Total GAD 7 Score 6 13  Anxiety Difficulty Somewhat difficult Somewhat difficult        Objective:   Physical Exam Today's visit was via telephone Physical exam was not possible for this visit Alert, oriented. Thoughts logical, coherent and relevant. Cheerful, mildly anxious affect.        Assessment & Plan:   Problem List Items Addressed This Visit      Other   Anxiety - Primary   Relevant Medications   traZODone (DESYREL) 50 MG tablet   ALPRAZolam (XANAX) 0.5 MG tablet   Insomnia     Meds ordered this encounter  Medications  . traZODone (DESYREL)  50 MG tablet    Sig: Take 0.5-1 tablets (25-50 mg total) by mouth at bedtime as needed for sleep.    Dispense:  30 tablet    Refill:  0    Order Specific Question:   Supervising Provider    Answer:   Sallee Lange A [[id number]]  . ALPRAZolam (XANAX) 0.5 MG tablet    Sig: Take 1 tablet (0.5 mg total) by mouth 2 (two) times daily as needed. for anxiety    Dispense:  30 tablet    Refill:  0    Order Specific Question:   Supervising Provider    Answer:   Sallee Lange A [id number]   Patient defers daily SSRI or similar medication for anxiety. Has tried several and had weight gain. Agrees to trial of low dose Trazodone for sleep. DC med and contact office if any adverse effects. Continue to use Xanax sparingly.  Return in about 3 months (around 07/28/2020).

## 2020-04-28 NOTE — Telephone Encounter (Signed)
Ms. tasheka, sol are scheduled for a virtual visit with your provider today.    Just as we do with appointments in the office, we must obtain your consent to participate.  Your consent will be active for this visit and any virtual visit you may have with one of our providers in the next 365 days.    If you have a MyChart account, I can also send a copy of this consent to you electronically.  All virtual visits are billed to your insurance company just like a traditional visit in the office.  As this is a virtual visit, video technology does not allow for your provider to perform a traditional examination.  This may limit your provider's ability to fully assess your condition.  If your provider identifies any concerns that need to be evaluated in person or the need to arrange testing such as labs, EKG, etc, we will make arrangements to do so.    Although advances in technology are sophisticated, we cannot ensure that it will always work on either your end or our end.  If the connection with a video visit is poor, we may have to switch to a telephone visit.  With either a video or telephone visit, we are not always able to ensure that we have a secure connection.   I need to obtain your verbal consent now.   Are you willing to proceed with your visit today?   SEYLAH CIRRINCIONE has provided verbal consent on 04/28/2020 for a virtual visit (video or telephone).   Patsy Lager, LPN [id number]  [id number] [phone]

## 2020-04-30 ENCOUNTER — Encounter: Payer: Self-pay | Admitting: Nurse Practitioner

## 2020-04-30 DIAGNOSIS — G4700 Insomnia, unspecified: Secondary | ICD-10-CM

## 2020-04-30 HISTORY — DX: Insomnia, unspecified: G47.00

## 2020-05-18 DIAGNOSIS — Z029 Encounter for administrative examinations, unspecified: Secondary | ICD-10-CM

## 2020-06-01 ENCOUNTER — Other Ambulatory Visit: Payer: Self-pay | Admitting: Family Medicine

## 2020-06-01 MED FILL — PANTOPRAZOLE SOD DR 40 MG T: 40 | 90 days supply | Qty: 90 | Fill #0

## 2020-06-06 ENCOUNTER — Encounter (HOSPITAL_COMMUNITY): Payer: No Typology Code available for payment source

## 2020-06-07 ENCOUNTER — Encounter (HOSPITAL_COMMUNITY): Payer: No Typology Code available for payment source

## 2020-06-24 ENCOUNTER — Other Ambulatory Visit: Payer: Self-pay | Admitting: Nurse Practitioner

## 2020-06-26 ENCOUNTER — Other Ambulatory Visit: Payer: Self-pay | Admitting: *Deleted

## 2020-06-26 DIAGNOSIS — I8393 Asymptomatic varicose veins of bilateral lower extremities: Secondary | ICD-10-CM

## 2020-06-27 ENCOUNTER — Other Ambulatory Visit: Payer: Self-pay

## 2020-06-27 ENCOUNTER — Telehealth (INDEPENDENT_AMBULATORY_CARE_PROVIDER_SITE_OTHER): Payer: No Typology Code available for payment source | Admitting: Family Medicine

## 2020-06-27 DIAGNOSIS — J019 Acute sinusitis, unspecified: Secondary | ICD-10-CM

## 2020-06-27 MED ORDER — AMOXICILLIN 500 MG PO CAPS: 500.0000 mg | ORAL_CAPSULE | Freq: Three times a day (TID) | ORAL | 0 refills | Status: DC

## 2020-06-27 NOTE — Progress Notes (Signed)
   Subjective:    Patient ID: Marissa Burnett, female    DOB: 11/15/1986, 34 y.o.   MRN: [id number]  Cough This is a new problem. The current episode started yesterday. Pertinent negatives include no chest pain or headaches. Associated symptoms comments: Slight frontal headache, coughing up yellow phelm, a little nasal drip. Treatments tried: mucinex, tylenol.  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  Denies any major issues or problems. Virtual Visit via Telephone Note  I connected with LEZLEE GILLS on 06/27/20 at  3:50 PM EDT by telephone and verified that I am speaking with the correct person using two identifiers.  Location: Patient: home Provider: office   I discussed the limitations, risks, security and privacy concerns of performing an evaluation and management service by telephone and the availability of in person appointments. I also discussed with the patient that there may be a patient responsible charge related to this service. The patient expressed understanding and agreed to proceed.   History of Present Illness:    Observations/Objective:   Assessment and Plan:   Follow Up Instructions:    I discussed the assessment and treatment plan with the patient. The patient was provided an opportunity to ask questions and all were answered. The patient agreed with the plan and demonstrated an understanding of the instructions.   The patient was advised to call back or seek an in-person evaluation if the symptoms worsen or if the condition fails to improve as anticipated.  I provided 20 minutes of non-face-to-face time during this encounter.     Review of Systems  Constitutional: Negative for activity change, appetite change and fatigue.  HENT: Negative for congestion and ear discharge.   Respiratory: Positive for cough.   Cardiovascular: Negative for chest pain and leg swelling.    Gastrointestinal: Negative for abdominal pain.  Skin: Negative for color change.  Neurological: Negative for headaches.  Psychiatric/Behavioral: Negative for behavioral problems.       Objective:   Physical Exam  Today's visit was via telephone Physical exam was not possible for this visit       Assessment & Plan:  Although the likelihood of Covid is low I recommend that she call health at work also recommend that she get Covid testing Antibiotics were sent in to cover for the possibility of acute rhinosinusitis Follow-up if progressive troubles

## 2020-07-10 ENCOUNTER — Ambulatory Visit (HOSPITAL_COMMUNITY)
Admission: RE | Admit: 2020-07-10 | Discharge: 2020-07-10 | Disposition: A | Discharge status: Home / Self Care | Payer: No Typology Code available for payment source | Source: Ambulatory Visit | Attending: Surgery | Admitting: Surgery

## 2020-07-10 ENCOUNTER — Ambulatory Visit (INDEPENDENT_AMBULATORY_CARE_PROVIDER_SITE_OTHER): Payer: No Typology Code available for payment source | Admitting: Physician Assistant

## 2020-07-10 ENCOUNTER — Other Ambulatory Visit: Payer: Self-pay

## 2020-07-10 VITALS — BP 110/75 | HR 88 | Temp 98.0°F | Resp 20 | Ht 64.0 in | Wt 169.8 lb

## 2020-07-10 DIAGNOSIS — I8393 Asymptomatic varicose veins of bilateral lower extremities: Secondary | ICD-10-CM | POA: Diagnosis not present

## 2020-07-10 NOTE — Progress Notes (Signed)
Requested by:  [name], MD [address] [place],  [place] [id number]  Reason for consultation: Bilateral lower extremity varicosities   History of Present Illness   Marissa Burnett is a 34 y.o. (12-31-1985) female who presents for evaluation of superficial bilateral lower extremity varicosities.  The patient states she has noted visible veins in her lower extremities for approximately 20 years, worse after the birth of her children.  She denies lower extremity pain or edema.  She has had no prior vascular procedures and is on no blood thinning medication.  No significant family history.   Venous symptoms include: Negative for aching, heavy, tired, throbbing, burning, itching, swelling, bleeding, ulcer  Onset/duration:   Approximately 20 years Occupation: Respiratory therapist Aggravating factors: Standing  alleviating factors: None  compression: Yes helps: Yes Pain medications: None Previous vein procedures: None History of DVT: No  Past Medical History:  Diagnosis Date  . Abnormal Papanicolaou smear of cervix 10/15/2016   LSIL+HPV  . Allergy   . Anxiety   . Chronic lumbar pain 09/27/2018   Patient has seen Dr. Arnoldo Morale neurosurgery September 2019-they do not feel there is anything surgical that can help-they are recommending injections with pain medicine specialist  . Dysrhythmia    history of palpitations   . Gallstones   . GERD (gastroesophageal reflux disease)   . Heart murmur birth    Past Surgical History:  Procedure Laterality Date  . ABDOMINOPLASTY    . BREAST ENHANCEMENT SURGERY    . CERVICAL CONIZATION W/BX N/A 12/13/2016   Procedure: CONIZATION CERVIX WITH BIOPSY--cold knife conization;  Surgeon: Jonnie Kind, MD;  Location: [facility];  Service: Gynecology;  Laterality: N/A;  . CHOLECYSTECTOMY  02/2002  . ENDOSCOPIC RETROGRADE CHOLANGIOPANCREATOGRAPHY (ERCP) WITH PROPOFOL    . OTHER SURGICAL HISTORY     stent in kidney during pregnancy,2002;  removed in 2003    Social History   Socioeconomic History  . Marital status: Married    Spouse name: Not on file  . Number of children: 2  . Years of education: Not on file  . Highest education level: Not on file  Occupational History  . Occupation: resp therapist    Employer: [employer]  Tobacco Use  . Smoking status: Never Smoker  . Smokeless tobacco: Never Used  Vaping Use  . Vaping Use: Never used  Substance and Sexual Activity  . Alcohol use: Yes    Comment: social  . Drug use: No  . Sexual activity: Yes    Birth control/protection: None  Other Topics Concern  . Not on file  Social History Narrative   Married, respiratory therapist [facility]   2 children   Occasional alcohol, never smoker no drug use   Previous work Personnel officer   Social Determinants of Radio broadcast assistant Strain:   . Difficulty of Paying Living Expenses:   Food Insecurity:   . Worried About Charity fundraiser in the Last Year:   . Arboriculturist in the Last Year:   Transportation Needs:   . Film/video editor (Medical):   Marland Kitchen Lack of Transportation (Non-Medical):   Physical Activity:   . Days of Exercise per Week:   . Minutes of Exercise per Session:   Stress:   . Feeling of Stress :   Social Connections:   . Frequency of Communication with Friends and Family:   . Frequency of Social Gatherings with Friends and  Family:   . Attends Religious Services:   . Active Member of Clubs or Organizations:   . Attends Archivist Meetings:   Marland Kitchen Marital Status:   Intimate Partner Violence:   . Fear of Current or Ex-Partner:   . Emotionally Abused:   Marland Kitchen Physically Abused:   . Sexually Abused:     Family History  Problem Relation Age of Onset  . Diabetes Other   . Lung cancer Paternal Grandfather   . Diabetes Maternal Grandmother   . COPD Maternal Grandfather   . Heart disease Paternal Grandmother   . Colon polyps Paternal Grandmother     . Irritable bowel syndrome Paternal Grandmother   . ADD / ADHD Daughter   . Anxiety disorder Daughter   . Allergic rhinitis Neg Hx   . Angioedema Neg Hx   . Asthma Neg Hx   . Atopy Neg Hx   . Eczema Neg Hx   . Immunodeficiency Neg Hx   . Urticaria Neg Hx     Current Outpatient Medications  Medication Sig Dispense Refill  . ALPRAZolam (XANAX) 0.5 MG tablet TAKE 1 TABLET(0.5 MG) BY MOUTH TWICE DAILY AS NEEDED FOR ANXIETY 30 tablet 0  . amoxicillin (AMOXIL) 500 MG capsule Take 1 capsule (500 mg total) by mouth 3 (three) times daily. 30 capsule 0  . BIOTIN PO Take 1 tablet by mouth daily.    Marland Kitchen EPINEPHrine 0.3 mg/0.3 mL IJ SOAJ injection epinephrine 0.3 mg/0.3 mL injection, auto-injector  INJECT 0 3 MLS  0 3 MG TOTAL  INTO THE MUSCLE ONCE    . loratadine (CLARITIN) 10 MG tablet Take 10 mg by mouth daily.    . pantoprazole (PROTONIX) 40 MG tablet TAKE 1 TABLET (40 MG TOTAL) BY MOUTH DAILY. 90 tablet 0   No current facility-administered medications for this visit.    Allergies  Allergen Reactions  . Peanut-Containing Drug Products Anaphylaxis    THROAT GETS TIGHT, peanut butter    REVIEW OF SYSTEMS (negative unless checked):   Cardiac:  []  Chest pain or chest pressure? []  Shortness of breath upon activity? []  Shortness of breath when lying flat? []  Irregular heart rhythm?  Vascular:  []  Pain in calf, thigh, or hip brought on by walking? []  Pain in feet at night that wakes you up from your sleep? []  Blood clot in your veins? []  Leg swelling?  Pulmonary:  []  Oxygen at home? []  Productive cough? []  Wheezing?  Neurologic:  []  Sudden weakness in arms or legs? []  Sudden numbness in arms or legs? []  Sudden onset of difficult speaking or slurred speech? []  Temporary loss of vision in one eye? []  Problems with dizziness?  Gastrointestinal:  []  Blood in stool? []  Vomited blood?  Genitourinary:  []  Burning when urinating? []  Blood in urine?  Psychiatric:  []  Major  depression  Hematologic:  []  Bleeding problems? []  Problems with blood clotting?  Dermatologic:  []  Rashes or ulcers?  Constitutional:  []  Fever or chills?  Ear/Nose/Throat:  []  Change in hearing? []  Nose bleeds? []  Sore throat?  Musculoskeletal:  []  Back pain? []  Joint pain? []  Muscle pain?   Physical Examination     Vitals:   07/10/20 1508  BP: 110/75  Pulse: 88  Resp: 20  Temp: 98 F (36.7 C)  SpO2: 99%   General:  WDWN in NAD; vital signs documented above Gait: Normal, no ataxia HENT: WNL, normocephalic Pulmonary: normal non-labored breathing , without Rales, rhonchi,  wheezing Cardiac: regular HR, without  Murmurs without carotid bruits Abdomen: soft, NT, no masses Skin: without rashes Vascular Exam/Pulses:  Right Left  Radial 2+ (normal) 2+ (normal)  Ulnar 2+ (normal) 2+ (normal)  Femoral 2+ (normal) 2+ (normal)  Popliteal 1+ 2+  DP 2+ (normal) 2+ (normal)  PT 2+ (normal) 2+ (normal)   Extremities: with varicose veins, with reticular veins, without edema, without stasis pigmentation, without lipodermatosclerosis, without ulcers Musculoskeletal: no muscle wasting or atrophy  Neurologic: A&O X 3;  No focal weakness or paresthesias are detected Psychiatric:  The pt has Normal affect.          Non-invasive Vascular Imaging   BLE Venous Insufficiency Duplex (07/10/2020):  Summary:  Right:  - No evidence of deep vein thrombosis seen in the right lower extremity,  from the common femoral through the popliteal veins.  - No evidence of superficial venous reflux seen in the right short  saphenous vein.  - Venous reflux is noted in the right greater saphenous vein in the thigh.    Left:  - No evidence of deep vein thrombosis seen in the left lower extremity,  from the common femoral through the popliteal veins.  - No evidence of superficial venous reflux seen in the left short  saphenous vein.  - Venous reflux is noted in the left greater  saphenous vein in the calf.   Medical Decision Making   Marissa Burnett is a 34 y.o. female who presents with: BLE venous insufficiency with evidence of bilateral greater saphenous vein reflux.  The GSV diamater is not amenable to endovenous ablation therapy. Small bilateral varicose veins.  These appear suitable to sclerotherapy.  The patient is interested in sclerotherapy and we discussed minimal out-of-pocket expenses. Will refer her to our sclerotherapist.  I discussed with the patient the use of her 20-30 mm thigh high compression stockings, elevation, healthy weight and exercise  Thank you for allowing [place] to participate in this patient's care.   Barbie Banner, PA-C [facility] Office: [phone]  07/10/2020, 3:11 PM  Clinic MD: Trula Slade

## 2020-07-12 ENCOUNTER — Telehealth: Payer: Self-pay | Admitting: Family Medicine

## 2020-07-12 DIAGNOSIS — I639 Cerebral infarction, unspecified: Secondary | ICD-10-CM

## 2020-07-12 HISTORY — DX: Cerebral infarction, unspecified: I63.9

## 2020-07-12 NOTE — Telephone Encounter (Signed)
Patient needing advice. Has known ocular migraines but today was different.  Had an episode of not feeling well (at work), went to the bathroom, felt like she was going to pass out. Then had numbness in hands, was unable to to feel the water when washing hands, door knob to get out of the bathroom. Felt like she couldn't speak. Felt weak, co-worker said she looked pale. Never a loss of consciousness, just fuzzy.  Did develop a h/a afterwards and was able to rest for about 30 mins and felt better.  Feels better now but wasn't sure if this was part of a ocular h/a or if something else was going on.  Has never experienced anything like this before.  Did not check BP at time of event but will check it now just in case. Had eaten prior to the event. Patient is at work so may not answer at call back, ok to leave a message.

## 2020-07-12 NOTE — Telephone Encounter (Signed)
It is not common but at the same time not unusual for a migraine to cause temporary neurologic symptoms.  Nonetheless typically for something like this we would still recommend to be seen.  Also for evaluation for the possibility of other neurologic issues.  In some cases even ordering MRI or setting up with neurology.  So therefore a follow-up would be reasonable.  We could see her this week or next week.  Also if patient has onset of unexplained neurologic symptoms-that does not seem part of a migraine-that persist more than 10 minutes it is best to go to the ER right away.  Please talk with patient and proceed forward as she wishes

## 2020-07-12 NOTE — Telephone Encounter (Signed)
Please advise. Thank you

## 2020-07-12 NOTE — Telephone Encounter (Signed)
Pt returned call and verbalized understanding. Pt transferred up front to have appt scheduled

## 2020-07-12 NOTE — Telephone Encounter (Signed)
Left message to return call 

## 2020-07-18 ENCOUNTER — Encounter: Payer: Self-pay | Admitting: Family Medicine

## 2020-07-18 ENCOUNTER — Other Ambulatory Visit: Payer: Self-pay

## 2020-07-18 ENCOUNTER — Ambulatory Visit (INDEPENDENT_AMBULATORY_CARE_PROVIDER_SITE_OTHER): Payer: No Typology Code available for payment source | Admitting: Family Medicine

## 2020-07-18 VITALS — BP 114/70 | Temp 97.2°F | Wt 168.8 lb

## 2020-07-18 DIAGNOSIS — R479 Unspecified speech disturbances: Secondary | ICD-10-CM | POA: Diagnosis not present

## 2020-07-18 DIAGNOSIS — R519 Headache, unspecified: Secondary | ICD-10-CM

## 2020-07-18 DIAGNOSIS — G43809 Other migraine, not intractable, without status migrainosus: Secondary | ICD-10-CM | POA: Diagnosis not present

## 2020-07-18 MED ORDER — SUMATRIPTAN SUCCINATE 50 MG PO TABS
50.0000 mg | ORAL_TABLET | ORAL | 1 refills | Status: DC | PRN
Start: 2020-07-18 — End: 2020-07-28

## 2020-07-18 MED ORDER — ONDANSETRON HCL 8 MG PO TABS
8.0000 mg | ORAL_TABLET | Freq: Three times a day (TID) | ORAL | 0 refills | Status: DC | PRN
Start: 2020-07-18 — End: 2020-08-21

## 2020-07-18 MED ORDER — LORAZEPAM 1 MG PO TABS: ORAL_TABLET | ORAL | 1 refills | Status: DC

## 2020-07-18 NOTE — Progress Notes (Signed)
   Subjective:    Patient ID: Marissa Burnett, female    DOB: Apr 24, 1986, 34 y.o.   MRN: [id number]  HPI Pt here for possible migraine. Pt states the other day she had an episode. Starts off with vision issues.  Unable to feel water on hands after using the bathroom, unable to find words, unable to get a grip on door handle. Pt laid down and then head began to hurt. Pt has similar episode the very next day but not to severity of first episode. First episode was a few days ago; last time was around 2018.  Head ached off and on from Wednesday to Saturday. Pt did take Ibuprofen.   Prescription changed in eye glasses in June. Pt contacted eye doctor on 2nd day. Eye doctor states that the eye glass script should not have had anything to do with it.  With discussion with the patient she had a distinct spell where she was having what appeared to be some visual aura in the peripheral field followed by neurologic symptoms of aphasia difficult time texting as well as utilization of her hands.  These deficits lasted at least 10 to 20 minutes she did have a mild headache afterwards it did get better she has never had anything quite like this she was fearful she was having a stroke.  She is she has had migraines in the past but never like this.  Patient denies being depressed or anxious.  Review of Systems  Constitutional: Negative for activity change, fatigue and fever.  HENT: Negative for congestion and rhinorrhea.   Respiratory: Negative for cough, chest tightness and shortness of breath.   Cardiovascular: Negative for chest pain and leg swelling.  Gastrointestinal: Negative for abdominal pain and nausea.  Skin: Negative for color change.  Neurological: Positive for dizziness, speech difficulty, weakness, light-headedness, numbness and headaches. Negative for tremors.  Psychiatric/Behavioral: Negative for agitation and behavioral problems.       Objective:   Physical Exam Vitals reviewed.    Constitutional:      General: She is not in acute distress. HENT:     Head: Normocephalic.  Cardiovascular:     Rate and Rhythm: Normal rate and regular rhythm.     Heart sounds: Normal heart sounds. No murmur heard.   Pulmonary:     Effort: Pulmonary effort is normal.     Breath sounds: Normal breath sounds.  Lymphadenopathy:     Cervical: No cervical adenopathy.  Neurological:     Mental Status: She is alert.  Psychiatric:        Behavior: Behavior normal.    No deficit on physical exam Patient was prescribed lorazepam to take before the MRI no driving      Assessment & Plan:  Severe migraine with neurologic consequence Given the fact that she had aphasia along with some weakness and symptoms of poor coordination it does raise into question whether or not there could be a underlying neurologic issues, tumor, cyst MRI of the brain is recommended ASAP.  Hopefully this is just a migraine equivalent with neurologic consequence.  Imitrex prescribed as well as Zofran.  Patient to keep track of headaches if she has frequent headaches next step would be preventative medication and neurologic referral

## 2020-07-19 ENCOUNTER — Other Ambulatory Visit: Payer: Self-pay | Admitting: *Deleted

## 2020-07-26 ENCOUNTER — Other Ambulatory Visit: Payer: Self-pay

## 2020-07-26 ENCOUNTER — Ambulatory Visit (HOSPITAL_COMMUNITY)
Admission: RE | Admit: 2020-07-26 | Discharge: 2020-07-26 | Disposition: A | Discharge status: Home / Self Care | Payer: No Typology Code available for payment source | Source: Ambulatory Visit | Attending: Family Medicine | Admitting: Family Medicine

## 2020-07-26 ENCOUNTER — Encounter: Payer: Self-pay | Admitting: Family Medicine

## 2020-07-26 DIAGNOSIS — G43109 Migraine with aura, not intractable, without status migrainosus: Secondary | ICD-10-CM | POA: Diagnosis not present

## 2020-07-26 DIAGNOSIS — I639 Cerebral infarction, unspecified: Secondary | ICD-10-CM | POA: Diagnosis not present

## 2020-07-26 DIAGNOSIS — R479 Unspecified speech disturbances: Secondary | ICD-10-CM | POA: Insufficient documentation

## 2020-07-26 DIAGNOSIS — G43809 Other migraine, not intractable, without status migrainosus: Secondary | ICD-10-CM | POA: Insufficient documentation

## 2020-07-26 DIAGNOSIS — R519 Headache, unspecified: Secondary | ICD-10-CM | POA: Insufficient documentation

## 2020-07-26 MED ORDER — GADOBUTROL 1 MMOL/ML IV SOLN
7.5000 mL | Freq: Once | INTRAVENOUS | Status: AC | PRN
  Administered 2020-07-26: 7.5 mL via INTRAVENOUS

## 2020-07-27 ENCOUNTER — Other Ambulatory Visit (HOSPITAL_COMMUNITY): Payer: No Typology Code available for payment source

## 2020-07-27 ENCOUNTER — Inpatient Hospital Stay (HOSPITAL_COMMUNITY): Payer: No Typology Code available for payment source

## 2020-07-27 ENCOUNTER — Inpatient Hospital Stay (HOSPITAL_COMMUNITY)
Admission: EM | Admit: 2020-07-27 | Discharge: 2020-07-28 | DRG: 102 | Disposition: A | Discharge status: Home / Self Care | Payer: No Typology Code available for payment source | Attending: Internal Medicine | Admitting: Internal Medicine

## 2020-07-27 ENCOUNTER — Telehealth: Payer: Self-pay | Admitting: Neurology

## 2020-07-27 ENCOUNTER — Encounter: Payer: Self-pay | Admitting: Family Medicine

## 2020-07-27 ENCOUNTER — Encounter (HOSPITAL_COMMUNITY): Payer: Self-pay

## 2020-07-27 ENCOUNTER — Other Ambulatory Visit: Payer: Self-pay | Admitting: Diagnostic Radiology

## 2020-07-27 ENCOUNTER — Emergency Department (HOSPITAL_COMMUNITY): Payer: No Typology Code available for payment source

## 2020-07-27 ENCOUNTER — Telehealth: Payer: Self-pay | Admitting: Family Medicine

## 2020-07-27 DIAGNOSIS — G43809 Other migraine, not intractable, without status migrainosus: Secondary | ICD-10-CM

## 2020-07-27 DIAGNOSIS — R9389 Abnormal findings on diagnostic imaging of other specified body structures: Secondary | ICD-10-CM | POA: Diagnosis not present

## 2020-07-27 DIAGNOSIS — R471 Dysarthria and anarthria: Secondary | ICD-10-CM | POA: Diagnosis present

## 2020-07-27 DIAGNOSIS — G459 Transient cerebral ischemic attack, unspecified: Secondary | ICD-10-CM | POA: Diagnosis present

## 2020-07-27 DIAGNOSIS — I639 Cerebral infarction, unspecified: Secondary | ICD-10-CM | POA: Diagnosis present

## 2020-07-27 DIAGNOSIS — Z6828 Body mass index (BMI) 28.0-28.9, adult: Secondary | ICD-10-CM | POA: Diagnosis not present

## 2020-07-27 DIAGNOSIS — H538 Other visual disturbances: Secondary | ICD-10-CM | POA: Diagnosis present

## 2020-07-27 DIAGNOSIS — Z20822 Contact with and (suspected) exposure to covid-19: Secondary | ICD-10-CM | POA: Diagnosis present

## 2020-07-27 DIAGNOSIS — K219 Gastro-esophageal reflux disease without esophagitis: Secondary | ICD-10-CM | POA: Diagnosis not present

## 2020-07-27 DIAGNOSIS — Z818 Family history of other mental and behavioral disorders: Secondary | ICD-10-CM

## 2020-07-27 DIAGNOSIS — Z87892 Personal history of anaphylaxis: Secondary | ICD-10-CM

## 2020-07-27 DIAGNOSIS — G43909 Migraine, unspecified, not intractable, without status migrainosus: Secondary | ICD-10-CM | POA: Diagnosis not present

## 2020-07-27 DIAGNOSIS — R29700 NIHSS score 0: Secondary | ICD-10-CM | POA: Diagnosis present

## 2020-07-27 DIAGNOSIS — R4701 Aphasia: Secondary | ICD-10-CM | POA: Diagnosis present

## 2020-07-27 DIAGNOSIS — Q211 Atrial septal defect: Secondary | ICD-10-CM | POA: Diagnosis not present

## 2020-07-27 DIAGNOSIS — R519 Headache, unspecified: Secondary | ICD-10-CM

## 2020-07-27 DIAGNOSIS — F419 Anxiety disorder, unspecified: Secondary | ICD-10-CM | POA: Diagnosis not present

## 2020-07-27 DIAGNOSIS — F900 Attention-deficit hyperactivity disorder, predominantly inattentive type: Secondary | ICD-10-CM | POA: Diagnosis present

## 2020-07-27 DIAGNOSIS — I63412 Cerebral infarction due to embolism of left middle cerebral artery: Secondary | ICD-10-CM

## 2020-07-27 DIAGNOSIS — E663 Overweight: Secondary | ICD-10-CM | POA: Diagnosis present

## 2020-07-27 DIAGNOSIS — Z79899 Other long term (current) drug therapy: Secondary | ICD-10-CM

## 2020-07-27 DIAGNOSIS — G8320 Monoplegia of upper limb affecting unspecified side: Secondary | ICD-10-CM | POA: Diagnosis present

## 2020-07-27 DIAGNOSIS — G43109 Migraine with aura, not intractable, without status migrainosus: Secondary | ICD-10-CM | POA: Diagnosis present

## 2020-07-27 DIAGNOSIS — Z91010 Allergy to peanuts: Secondary | ICD-10-CM | POA: Diagnosis not present

## 2020-07-27 LAB — CSF CELL COUNT WITH DIFFERENTIAL
RBC Count, CSF: 0 /mm3
RBC Count, CSF: 1 /mm3 — ABNORMAL HIGH
Tube #: 3
Tube #: 4
WBC, CSF: 0 /mm3 (ref 0–5)
WBC, CSF: 0 /mm3 (ref 0–5)

## 2020-07-27 LAB — DIFFERENTIAL
Abs Immature Granulocytes: 0.01 10*3/uL (ref 0.00–0.07)
Basophils Absolute: 0.1 10*3/uL (ref 0.0–0.1)
Basophils Relative: 1 %
Eosinophils Absolute: 0.1 10*3/uL (ref 0.0–0.5)
Eosinophils Relative: 1 %
Immature Granulocytes: 0 %
Lymphocytes Relative: 20 %
Lymphs Abs: 1.1 10*3/uL (ref 0.7–4.0)
Monocytes Absolute: 0.4 10*3/uL (ref 0.1–1.0)
Monocytes Relative: 7 %
Neutro Abs: 3.9 10*3/uL (ref 1.7–7.7)
Neutrophils Relative %: 71 %

## 2020-07-27 LAB — CBC
HCT: 42.2 % (ref 36.0–46.0)
Hemoglobin: 14.1 g/dL (ref 12.0–15.0)
MCH: 31.1 pg (ref 26.0–34.0)
MCHC: 33.4 g/dL (ref 30.0–36.0)
MCV: 93 fL (ref 80.0–100.0)
Platelets: 192 10*3/uL (ref 150–400)
RBC: 4.54 MIL/uL (ref 3.87–5.11)
RDW: 12.6 % (ref 11.5–15.5)
WBC: 5.5 10*3/uL (ref 4.0–10.5)
nRBC: 0 % (ref 0.0–0.2)

## 2020-07-27 LAB — COMPREHENSIVE METABOLIC PANEL
ALT: 13 U/L (ref 0–44)
AST: 18 U/L (ref 15–41)
Albumin: 4.1 g/dL (ref 3.5–5.0)
Alkaline Phosphatase: 55 U/L (ref 38–126)
Anion gap: 11 (ref 5–15)
BUN: 12 mg/dL (ref 6–20)
CO2: 21 mmol/L — ABNORMAL LOW (ref 22–32)
Calcium: 9.1 mg/dL (ref 8.9–10.3)
Chloride: 105 mmol/L (ref 98–111)
Creatinine, Ser: 0.72 mg/dL (ref 0.44–1.00)
GFR calc Af Amer: >60 mL/min (ref >60)
GFR calc non Af Amer: >60 mL/min (ref >60)
Glucose, Bld: 109 mg/dL — ABNORMAL HIGH (ref 70–99)
Potassium: 3.6 mmol/L (ref 3.5–5.1)
Sodium: 137 mmol/L (ref 135–145)
Total Bilirubin: 1 mg/dL (ref 0.3–1.2)
Total Protein: 7.3 g/dL (ref 6.5–8.1)

## 2020-07-27 LAB — RAPID URINE DRUG SCREEN, HOSP PERFORMED
Amphetamines: NOT DETECTED
Barbiturates: NOT DETECTED
Benzodiazepines: POSITIVE — AB
Cocaine: NOT DETECTED
Opiates: NOT DETECTED
Tetrahydrocannabinol: NOT DETECTED

## 2020-07-27 LAB — URINALYSIS, ROUTINE W REFLEX MICROSCOPIC
Bilirubin Urine: NEGATIVE
Glucose, UA: NEGATIVE mg/dL
Hgb urine dipstick: NEGATIVE
Ketones, ur: NEGATIVE mg/dL
Leukocytes,Ua: NEGATIVE
Nitrite: NEGATIVE
Protein, ur: NEGATIVE mg/dL
Specific Gravity, Urine: 1.006 (ref 1.005–1.030)
pH: 6 (ref 5.0–8.0)

## 2020-07-27 LAB — PROTEIN, CSF: Total  Protein, CSF: 20 mg/dL (ref 15–45)

## 2020-07-27 LAB — I-STAT BETA HCG BLOOD, ED (MC, WL, AP ONLY): I-stat hCG, quantitative: <5 m[IU]/mL (ref <5)

## 2020-07-27 LAB — PROTIME-INR
INR: 1 (ref 0.8–1.2)
Prothrombin Time: 12.8 seconds (ref 11.4–15.2)

## 2020-07-27 LAB — ETHANOL: Alcohol, Ethyl (B): <10 mg/dL (ref <10)

## 2020-07-27 LAB — GLUCOSE, CSF: Glucose, CSF: 60 mg/dL (ref 40–70)

## 2020-07-27 LAB — SARS CORONAVIRUS 2 BY RT PCR (HOSPITAL ORDER, PERFORMED IN ~~LOC~~ HOSPITAL LAB): SARS Coronavirus 2: NEGATIVE

## 2020-07-27 LAB — APTT: aPTT: 28 seconds (ref 24–36)

## 2020-07-27 MED ORDER — ALPRAZOLAM 0.5 MG PO TABS
0.5000 mg | ORAL_TABLET | Freq: Two times a day (BID) | ORAL | Status: DC | PRN
  Administered 2020-07-27 – 2020-07-28 (×2): 0.5 mg via ORAL
  Filled 2020-07-27: qty 1
  Filled 2020-07-27: qty 2
  Filled 2020-07-27: qty 1

## 2020-07-27 MED ORDER — ACETAMINOPHEN 160 MG/5ML PO SOLN: 650.0000 mg | ORAL | Status: DC | PRN

## 2020-07-27 MED ORDER — LORAZEPAM 2 MG/ML IJ SOLN
1.0000 mg | Freq: Once | INTRAMUSCULAR | Status: AC | PRN
  Administered 2020-07-27: 1 mg via INTRAVENOUS

## 2020-07-27 MED ORDER — ENOXAPARIN SODIUM 40 MG/0.4ML ~~LOC~~ SOLN
40.0000 mg | SUBCUTANEOUS | Status: DC
  Administered 2020-07-27 – 2020-07-28 (×2): 40 mg via SUBCUTANEOUS
  Filled 2020-07-27 (×2): qty 0.4

## 2020-07-27 MED ORDER — ACETAMINOPHEN 650 MG RE SUPP: 650.0000 mg | RECTAL | Status: DC | PRN

## 2020-07-27 MED ORDER — ACETAMINOPHEN 325 MG PO TABS
650.0000 mg | ORAL_TABLET | ORAL | Status: DC | PRN
  Administered 2020-07-27 – 2020-07-28 (×2): 650 mg via ORAL
  Filled 2020-07-27 (×3): qty 2

## 2020-07-27 MED ORDER — SODIUM CHLORIDE 0.9 % IV SOLN: Freq: Once | INTRAVENOUS | Status: AC

## 2020-07-27 MED ORDER — LORATADINE 10 MG PO TABS
10.0000 mg | ORAL_TABLET | Freq: Every day | ORAL | Status: DC
  Administered 2020-07-28: 10 mg via ORAL
  Filled 2020-07-27: qty 1

## 2020-07-27 MED ORDER — LIDOCAINE HCL (PF) 1 % IJ SOLN: 5.0000 mL | Freq: Once | INTRAMUSCULAR | Status: DC

## 2020-07-27 MED ORDER — STROKE: EARLY STAGES OF RECOVERY BOOK
Freq: Once | Status: AC
  Filled 2020-07-27 (×2): qty 1

## 2020-07-27 MED ORDER — LORAZEPAM 2 MG/ML IJ SOLN
1.0000 mg | Freq: Once | INTRAMUSCULAR | Status: DC
  Filled 2020-07-27: qty 1

## 2020-07-27 MED ORDER — IOHEXOL 350 MG/ML SOLN
100.0000 mL | Freq: Once | INTRAVENOUS | Status: AC | PRN
  Administered 2020-07-27: 100 mL via INTRAVENOUS

## 2020-07-27 MED ORDER — SODIUM CHLORIDE 0.9 % IV SOLN: INTRAVENOUS | Status: AC

## 2020-07-27 MED ORDER — SUMATRIPTAN SUCCINATE 50 MG PO TABS
50.0000 mg | ORAL_TABLET | ORAL | Status: DC | PRN
  Filled 2020-07-27: qty 1

## 2020-07-27 MED ORDER — PANTOPRAZOLE SODIUM 40 MG PO TBEC
40.0000 mg | DELAYED_RELEASE_TABLET | Freq: Every day | ORAL | Status: DC
  Administered 2020-07-28: 40 mg via ORAL
  Filled 2020-07-27: qty 1

## 2020-07-27 NOTE — ED Triage Notes (Addendum)
Pt states two weeks ago she while at work she started having some weakness while in the bathroom and could not grip the door handle to get out. She also states she was having trouble speaking.She was sent her by her PCP after an abnormal scan result. Problems are now resolved on arrival.

## 2020-07-27 NOTE — Telephone Encounter (Signed)
Patient seen to test results on mychart this morning and would like to know what they mean. She is leaving to go out of the country tomorrow. She states it did show something but wanting to know what it means and can she still go on her trip. Please advise

## 2020-07-27 NOTE — ED Notes (Signed)
Patient transported to CT 

## 2020-07-27 NOTE — Telephone Encounter (Signed)
Contacted Dr.Ahern office and gave Dr.Scott cell number to receptionist.   Contacted pt. Pt states that Dr.Ahern contacted her and told her to go to ER for full work up and admitting. Pt states that she is waiting on her husband to come pick her up and they will be going to [facility] ER.

## 2020-07-27 NOTE — Telephone Encounter (Signed)
I did speak with pt She is going to [facility] as directed

## 2020-07-27 NOTE — Consult Note (Addendum)
NEURO HOSPITALIST CONSULT NOTE   Requesting physician: Dr. Alvino Chapel  Reason for Consult: Abnormal MRI  History obtained from:  Patient  HPI:                                                                                                                                          Marissa Burnett is a 34 y.o. female with a PMHx of migraines who presented to the [facility] ED after having an abnormal MRI.  The patient states that about 2 weeks ago she had an episode where she became light-headed, with binocular blurred vision and generalized weakness. She tried to wash her hands and could not feel the water; she cannot recall if the numbness was in her left hand, right hand or both. The symptoms occurred while she was at work; her co-workers reported that she was pale. She also had word finding difficulty. She was able to grip her phone, but was unable to text. The symptoms lasted for about 30 minutes prior to resolving spontaneously. She then noticed a slight HA. She has had ocular migraines in the past; blurred vision typically comes before the HA. She is usually able to sit down and close her eyes, take ibuprofen and symptoms are relieved.   ED course:  Fluoro guided LP: pending UDS: + benzodiazepines (on Xanax for anxiety) CBG: 109  Past Medical History:  Diagnosis Date  . Abnormal Papanicolaou smear of cervix 10/15/2016   LSIL+HPV  . Allergy   . Anxiety   . Chronic lumbar pain 09/27/2018   Patient has seen Dr. Arnoldo Morale neurosurgery September 2019-they do not feel there is anything surgical that can help-they are recommending injections with pain medicine specialist  . Dysrhythmia    history of palpitations   . Gallstones   . GERD (gastroesophageal reflux disease)   . Heart murmur birth    Past Surgical History:  Procedure Laterality Date  . ABDOMINOPLASTY    . BREAST ENHANCEMENT SURGERY    . CERVICAL CONIZATION W/BX N/A 12/13/2016   Procedure: CONIZATION CERVIX  WITH BIOPSY--cold knife conization;  Surgeon: Jonnie Kind, MD;  Location: [facility];  Service: Gynecology;  Laterality: N/A;  . CHOLECYSTECTOMY  02/2002  . ENDOSCOPIC RETROGRADE CHOLANGIOPANCREATOGRAPHY (ERCP) WITH PROPOFOL    . OTHER SURGICAL HISTORY     stent in kidney during pregnancy,2002; removed in 2003    Family History  Problem Relation Age of Onset  . Diabetes Other   . Lung cancer Paternal Grandfather   . Diabetes Maternal Grandmother   . COPD Maternal Grandfather   . Heart disease Paternal Grandmother   . Colon polyps Paternal Grandmother   . Irritable bowel syndrome Paternal Grandmother   . ADD / ADHD Daughter   . Anxiety disorder Daughter   .  Allergic rhinitis Neg Hx   . Angioedema Neg Hx   . Asthma Neg Hx   . Atopy Neg Hx   . Eczema Neg Hx   . Immunodeficiency Neg Hx   . Urticaria Neg Hx         Social History:  reports that she has never smoked. She has never used smokeless tobacco. She reports current alcohol use. She reports that she does not use drugs.  Allergies  Allergen Reactions  . Peanut-Containing Drug Products Anaphylaxis    THROAT GETS TIGHT, peanut butter    MEDICATIONS:                                                                                                                     Current Facility-Administered Medications  Medication Dose Route Frequency Provider Last Rate Last Admin  . LORazepam (ATIVAN) injection 1 mg  1 mg Intravenous Once PRN Davonna Belling, MD       Current Outpatient Medications  Medication Sig Dispense Refill  . ALPRAZolam (XANAX) 0.5 MG tablet TAKE 1 TABLET(0.5 MG) BY MOUTH TWICE DAILY AS NEEDED FOR ANXIETY (Patient taking differently: Take 0.5 mg by mouth 2 (two) times daily as needed for anxiety. ) 30 tablet 0  . BIOTIN PO Take 1 tablet by mouth daily.    Marland Kitchen loratadine (CLARITIN) 10 MG tablet Take 10 mg by mouth daily.    Marland Kitchen LORazepam (ATIVAN) 1 MG tablet 1 taken 1 hour before procedure no driving (Patient taking  differently: Take 1 mg by mouth See admin instructions. 1 taken 1 hour before procedure no driving) 6 tablet 1  . ondansetron (ZOFRAN) 8 MG tablet Take 1 tablet (8 mg total) by mouth every 8 (eight) hours as needed for nausea. 12 tablet 0  . pantoprazole (PROTONIX) 40 MG tablet TAKE 1 TABLET (40 MG TOTAL) BY MOUTH DAILY. 90 tablet 0  . SUMAtriptan (IMITREX) 50 MG tablet Take 1 tablet (50 mg total) by mouth every 2 (two) hours as needed for migraine or headache. May repeat in 2 hours if headache persists or recurs. 10 tablet 1  . EPINEPHrine 0.3 mg/0.3 mL IJ SOAJ injection Inject 0.3 mg into the muscle once as needed for anaphylaxis.        ROS:  ROS was performed and is negative except as noted in HPI   Blood pressure 123/78, pulse 100, temperature (S) 99.1 F (37.3 C), resp. rate 22, SpO2 95 %.   General Examination:                                                                                                       Physical Exam  Constitutional: Appears well-developed and well-nourished.  Psych: Affect appropriate to situation Eyes: Normal external eye and conjunctiva. HENT: Normocephalic, no lesions, without obvious abnormality.   Musculoskeletal-no joint tenderness, deformity or swelling Cardiovascular: Normal rate and regular rhythm.  Respiratory: Effort normal, non-labored breathing saturations WNL GI: Soft.  No distension. There is no tenderness.  Skin: WDI  Neurological Examination Mental Status: Alert, oriented, thought content appropriate.  Speech fluent without evidence of aphasia.  Able to follow commands without difficulty. Cranial Nerves: II:  Visual fields grossly normal, intact to DSS. PERRL.  III,IV, VI: Ptosis not present, EOMI, no nystagmus V,VII: Smile symmetric, facial light touch sensation normal bilaterally VIII: Hearing  intact to conversation IX,X: Palate rises symmetrically XI: Symmetric shoulder shrug XII: Midline tongue extension Motor: Right : Upper extremity   5/5  Left:     Upper extremity:  5/5 except biceps 4-/5  Lower extremity   5/5   Lower extremity   5/5 Normal tone throughout; no atrophy noted Sensory:  Light touch intact throughout, bilaterally Deep Tendon Reflexes: 2+ and symmetric brachioradialis, biceps patellae and achilles Plantars: Right: downgoing  Left: downgoing Cerebellar: Normal FNF bilaterally  Gait: Deferred   Lab Results: Basic Metabolic Panel: Recent Labs  Lab 07/27/20 1153  NA 137  K 3.6  CL 105  CO2 21*  GLUCOSE 109*  BUN 12  CREATININE 0.72  CALCIUM 9.1    CBC: Recent Labs  Lab 07/27/20 1153  WBC 5.5  NEUTROABS 3.9  HGB 14.1  HCT 42.2  MCV 93.0  PLT 192    Imaging: MR Brain W Wo Contrast  Result Date: 07/26/2020 CLINICAL DATA:  Headache. Speech disturbance and migraine headaches. EXAM: MRI HEAD WITHOUT AND WITH CONTRAST TECHNIQUE: Multiplanar, multiecho pulse sequences of the brain and surrounding structures were obtained without and with intravenous contrast. CONTRAST:  7.28m GADAVIST GADOBUTROL 1 MMOL/ML IV SOLN COMPARISON:  CT head 10/08/2005 FINDINGS: Brain: Small areas of diffusion signal in the left posterior frontal cortex without FLAIR signal or enhancement. Small area of diffusion signal in the left posterior temporal cortex associated with linear ribbon like cortical enhancement. The enhancing area measures approximately 8 mm in the diffusion signal is approximately 4 mm. No significant abnormal FLAIR signal in this region. Small linear area of cortical enhancement in the left parietal lobe measuring approximately 10 mm. No significant restricted diffusion or abnormal FLAIR signal. Ventricle size normal. Negative for hemorrhage. No evidence of demyelinating disease or chronic ischemia. Vascular: Normal arterial flow voids Skull and upper cervical  spine: Normal bone marrow. Sinuses/Orbits: Paranasal sinuses clear.  Negative orbit Other: None IMPRESSION: Two small areas of diffusion signal in the left posterior frontal cortex likely areas  of recent infarction. Due to small size it is difficult to determine if there is restricted diffusion. Two linear areas of enhancement in the posterior left temporal lobe and left parietal lobe likely areas of subacute infarction. Correlate with neurologic exam and symptoms. The enhancing lesions are not likely to represent metastatic disease given their linear morphology. Short term MRI follow-up in 6-8 weeks may be helpful. Electronically Signed   By: Franchot Gallo M.D.   On: 07/26/2020 20:14    Assessment: 34 y.o. female with a PMHx of migraines who presented to the [facility] ED after having an abnormal MRI.  1. Images from MRI with/without contrast were personally reviewed. There are three small DWI-positive pial-cortical lesions on MRI, all in the left hemisphere, two with associated avid enhancement. The DDx is broad and includes neurosarcoidosis, TB, neurosyphilis, Lyme disease, septic emboli and metastases.   2. Given the abnormal appearance of the MRI would like to also obtain CSF to r/o infection and to assess for inflammatory markers as well as for cytology. Will also complete full stroke work up.   Recommendations: -- LP for protein, glucose, HSV-PCR, cell count with diff, bacterial and fungal culture, oligoclonal bands, VDRL, AFB stain, cryptococcal Ag and IgG index. -- Serum ACE level -- BP goal : Normotensive -- CTA of head and neck -- TTE. If negative, obtain TEE -- Blood culture x 2 -- HgbA1c, fasting lipid panel -- PT consult, OT consult, Speech consult -- Telemetry monitoring -- Frequent neuro checks  Laurey Morale, MSN, NP-C [facility] [phone]   I have seen and evaluated the patient. I have formulated the assessment and recommendations. My exam findings were  observed and documented by Laurey Morale, NP.  Electronically signed: Dr. Kerney Elbe  Addendum:  -- Vasculitis (granulomatous angiitis of the CNS, lupus, ANCA-positive vasculitis) as well as hypercoagulable state are also on the DDx.  - Ordering basic vasculitis panel (ANCA antibodies, ESR, C-reactive protein, lupus panel, hepatitis C Abs) as well as hypercoagulable work-up)  07/27/2020, 1:44 PM

## 2020-07-27 NOTE — Progress Notes (Signed)
Called back at [phone] to receive report at this time. No response

## 2020-07-27 NOTE — Telephone Encounter (Signed)
I saw this patient's MRI report I did confirm with the neuroradiologist There is concerned that this may be a subacute stroke/infarction Please let me speak with Dr.Ahern today.(Please call neurology office see if Dr. Lavell Anchors is in and please have her call me on my cell phone regarding this patient-the reason I need to speak with the neurologist today is because patient is going out of country tomorrow) Nurses also please inform patient that I am trying to confer with specialist regarding this issue and we will keep her posted later today-please tell the patient we are working as quickly as possible on all this but awaiting callback from specialists

## 2020-07-27 NOTE — Telephone Encounter (Signed)
Dr. Wolfgang Phoenix called [place] regarding patient with what appears to be acute and subacute ischemic events on MRI with recent episodes of aphasia and other neurologic symptoms.  Patient is 34 years old and at this time I recommend she goes to [facility] emergency room and be admitted to the stroke service for an expedient stroke evaluation since she is having embolic events and it appears multiple of them so far.  I informed the stroke attending Dr. Rigoberto Noel and I also informed Dr. Cheral Marker who is a neuro hospitalist on call.  I spoke to patient as well as Dr. Wolfgang Phoenix.  Bethany: Which you be able to call the triage nurse at [facility] emergency room and let her know that patient is having acute strokes and she needs to be taken into the emergency room as soon as possible and also that the neurology team is aware.

## 2020-07-27 NOTE — Telephone Encounter (Signed)
I called [facility] ER and spoke with Judson Roch at triage advising pt is coming and needs to be taken back asap as her scan shows acute strokes. Neurology already aware. She verbalized understanding.

## 2020-07-27 NOTE — ED Provider Notes (Signed)
[facility] EMERGENCY DEPARTMENT Provider Note   CSN: [id number] Arrival date & time: 07/27/20  1115     History Chief Complaint  Patient presents with   Weakness    Marissa Burnett is a 34 y.o. female.  HPI Patient presents with abnormal MRI.  Around 2 weeks ago had an episode where she had a headache and developed weakness difficulty typing difficulty speaking and felt lightheaded.  Began after she been going to bathroom.  Lasted around 30 minutes.  Since then has had episodes of headache without any deficits along with it.  Has had what she thought maybe was ocular migraines in the past but not had an official diagnosis.  Followed up with PCP who got an MRI that showed possible strokes.  Discussed with neurology who recommended coming to the ER for admission.  No fevers.  States she has been told she had a heart murmur in the past.  No family history of hypercoagulable states.  Patient states she had an autoimmune work-up in the past but it was negative.    Past Medical History:  Diagnosis Date   Abnormal Papanicolaou smear of cervix 10/15/2016   LSIL+HPV   Allergy    Anxiety    Chronic lumbar pain 09/27/2018   Patient has seen Dr. Arnoldo Morale neurosurgery September 2019-they do not feel there is anything surgical that can help-they are recommending injections with pain medicine specialist   Dysrhythmia    history of palpitations    Gallstones    GERD (gastroesophageal reflux disease)    Heart murmur birth    Patient Active Problem List   Diagnosis Date Noted   Insomnia 04/30/2020   Gastroesophageal reflux disease with esophagitis 07/01/2019   Chronic lumbar pain 09/27/2018   CIN III (cervical intraepithelial neoplasia grade III) with severe dysplasia 11/18/2016   Abnormal Papanicolaou smear of cervix 10/15/2016   Urticaria 03/16/2016   Anxiety 06/30/2015   ADD (attention deficit disorder) 07/20/2014    Past Surgical History:   Procedure Laterality Date   ABDOMINOPLASTY     BREAST ENHANCEMENT SURGERY     CERVICAL CONIZATION W/BX N/A 12/13/2016   Procedure: CONIZATION CERVIX WITH BIOPSY--cold knife conization;  Surgeon: Jonnie Kind, MD;  Location: [facility];  Service: Gynecology;  Laterality: N/A;   CHOLECYSTECTOMY  02/2002   ENDOSCOPIC RETROGRADE CHOLANGIOPANCREATOGRAPHY (ERCP) WITH PROPOFOL     OTHER SURGICAL HISTORY     stent in kidney during pregnancy,2002; removed in 2003     OB History    Gravida  4   Para  2   Term  2   Preterm      AB  2   Living  2     SAB  2   TAB      Ectopic      Multiple      Live Births  2           Family History  Problem Relation Age of Onset   Diabetes Other    Lung cancer Paternal Grandfather    Diabetes Maternal Grandmother    COPD Maternal Grandfather    Heart disease Paternal Grandmother    Colon polyps Paternal Grandmother    Irritable bowel syndrome Paternal Grandmother    ADD / ADHD Daughter    Anxiety disorder Daughter    Allergic rhinitis Neg Hx    Angioedema Neg Hx    Asthma Neg Hx    Atopy Neg Hx    Eczema Neg Hx  Immunodeficiency Neg Hx    Urticaria Neg Hx     Social History   Tobacco Use   Smoking status: Never Smoker   Smokeless tobacco: Never Used  Vaping Use   Vaping Use: Never used  Substance Use Topics   Alcohol use: Yes    Comment: social   Drug use: No    Home Medications Prior to Admission medications   Medication Sig Start Date End Date Taking? Authorizing Provider  ALPRAZolam (XANAX) 0.5 MG tablet TAKE 1 TABLET(0.5 MG) BY MOUTH TWICE DAILY AS NEEDED FOR ANXIETY Patient taking differently: Take 0.5 mg by mouth 2 (two) times daily as needed for anxiety.  06/26/20  Yes Pearson Forster C, NP  BIOTIN PO Take 1 tablet by mouth daily.   Yes [provider]  loratadine (CLARITIN) 10 MG tablet Take 10 mg by mouth daily.   Yes [provider]  LORazepam (ATIVAN) 1 MG  tablet 1 taken 1 hour before procedure no driving Patient taking differently: Take 1 mg by mouth See admin instructions. 1 taken 1 hour before procedure no driving 3/49/17  Yes Luking, Scott A, MD  ondansetron (ZOFRAN) 8 MG tablet Take 1 tablet (8 mg total) by mouth every 8 (eight) hours as needed for nausea. 07/18/20  Yes Luking, Scott A, MD  pantoprazole (PROTONIX) 40 MG tablet TAKE 1 TABLET (40 MG TOTAL) BY MOUTH DAILY. 06/01/20  Yes Luking, Elayne Snare, MD  SUMAtriptan (IMITREX) 50 MG tablet Take 1 tablet (50 mg total) by mouth every 2 (two) hours as needed for migraine or headache. May repeat in 2 hours if headache persists or recurs. 07/18/20  Yes Luking, Scott A, MD  EPINEPHrine 0.3 mg/0.3 mL IJ SOAJ injection Inject 0.3 mg into the muscle once as needed for anaphylaxis.     [provider]    Allergies    Peanut-containing drug products  Review of Systems   Review of Systems  Constitutional: Negative for appetite change.  Eyes: Positive for visual disturbance.  Cardiovascular: Negative for chest pain.  Gastrointestinal: Negative for abdominal pain.  Musculoskeletal: Negative for back pain.  Skin: Negative for rash.  Neurological: Positive for dizziness and speech difficulty. Negative for weakness.  Psychiatric/Behavioral: Negative for confusion.    Physical Exam Updated Vital Signs BP 123/78    Pulse 100    Temp (S) 99.1 F (37.3 C)    Resp 22    SpO2 95%   Physical Exam Vitals and nursing note reviewed.  HENT:     Head: Atraumatic.  Eyes:     Extraocular Movements: Extraocular movements intact.  Cardiovascular:     Rate and Rhythm: Regular rhythm.  Pulmonary:     Breath sounds: No wheezing or rhonchi.  Abdominal:     Tenderness: There is no abdominal tenderness.  Musculoskeletal:        General: No tenderness.     Cervical back: Neck supple.  Skin:    General: Skin is warm.     Capillary Refill: Capillary refill takes less than 2 seconds.  Neurological:      Mental Status: She is alert and oriented to person, place, and time.     ED Results / Procedures / Treatments   Labs (all labs ordered are listed, but only abnormal results are displayed) Labs Reviewed  COMPREHENSIVE METABOLIC PANEL - Abnormal; Notable for the following components:      Result Value   CO2 21 (*)    Glucose, Bld 109 (*)  All other components within normal limits  RAPID URINE DRUG SCREEN, HOSP PERFORMED - Abnormal; Notable for the following components:   Benzodiazepines POSITIVE (*)    All other components within normal limits  URINALYSIS, ROUTINE W REFLEX MICROSCOPIC - Abnormal; Notable for the following components:   Color, Urine STRAW (*)    All other components within normal limits  SARS CORONAVIRUS 2 BY RT PCR (HOSPITAL ORDER, [facility])  CSF CULTURE  GRAM STAIN  CULTURE, FUNGUS WITHOUT SMEAR  ANAEROBIC CULTURE  CULTURE, BLOOD (ROUTINE X 2)  CULTURE, BLOOD (ROUTINE X 2)  ACID FAST SMEAR (AFB, MYCOBACTERIA)  ACID FAST CULTURE WITH REFLEXED SENSITIVITIES (MYCOBACTERIA)  ETHANOL  PROTIME-INR  APTT  CBC  DIFFERENTIAL  CSF CELL COUNT WITH DIFFERENTIAL  CSF CELL COUNT WITH DIFFERENTIAL  PROTEIN AND GLUCOSE, CSF  VDRL, CSF  OLIGOCLONAL BANDS, CSF + SERM  DRAW EXTRA CLOT TUBE  IGG CSF INDEX  DRAW EXTRA CLOT TUBE  ANGIOTENSIN CONVERTING ENZYME  I-STAT BETA HCG BLOOD, ED ([facility], [facility], [facility] ONLY)  CYTOLOGY - NON PAP    EKG EKG Interpretation  Date/Time:  Thursday July 27 2020 12:11:10 EDT Ventricular Rate:  99 PR Interval:    QRS Duration: 99 QT Interval:  342 QTC Calculation: 439 R Axis:   73 Text Interpretation: Sinus rhythm RSR' in V1 or V2, right VCD or RVH Confirmed by Davonna Belling [phone]) on 07/27/2020 12:30:14 PM   Radiology MR Brain W Wo Contrast  Result Date: 07/26/2020 CLINICAL DATA:  Headache. Speech disturbance and migraine headaches. EXAM: MRI HEAD WITHOUT AND WITH CONTRAST TECHNIQUE: Multiplanar, multiecho  pulse sequences of the brain and surrounding structures were obtained without and with intravenous contrast. CONTRAST:  7.23mL GADAVIST GADOBUTROL 1 MMOL/ML IV SOLN COMPARISON:  CT head 10/08/2005 FINDINGS: Brain: Small areas of diffusion signal in the left posterior frontal cortex without FLAIR signal or enhancement. Small area of diffusion signal in the left posterior temporal cortex associated with linear ribbon like cortical enhancement. The enhancing area measures approximately 8 mm in the diffusion signal is approximately 4 mm. No significant abnormal FLAIR signal in this region. Small linear area of cortical enhancement in the left parietal lobe measuring approximately 10 mm. No significant restricted diffusion or abnormal FLAIR signal. Ventricle size normal. Negative for hemorrhage. No evidence of demyelinating disease or chronic ischemia. Vascular: Normal arterial flow voids Skull and upper cervical spine: Normal bone marrow. Sinuses/Orbits: Paranasal sinuses clear.  Negative orbit Other: None IMPRESSION: Two small areas of diffusion signal in the left posterior frontal cortex likely areas of recent infarction. Due to small size it is difficult to determine if there is restricted diffusion. Two linear areas of enhancement in the posterior left temporal lobe and left parietal lobe likely areas of subacute infarction. Correlate with neurologic exam and symptoms. The enhancing lesions are not likely to represent metastatic disease given their linear morphology. Short term MRI follow-up in 6-8 weeks may be helpful. Electronically Signed   By: Franchot Gallo M.D.   On: 07/26/2020 20:14    Procedures Procedures (including critical care time)  Medications Ordered in ED Medications  LORazepam (ATIVAN) injection 1 mg (has no administration in time range)  lidocaine (PF) (XYLOCAINE) 1 % injection 5 mL (has no administration in time range)    ED Course  I have reviewed the triage vital signs and the  nursing notes.  Pertinent labs & imaging results that were available during my care of the patient were reviewed by  me and considered in my medical decision making (see chart for details).    MDM Rules/Calculators/A&P                          Patient presents after abnormal MRI findings.  Possible strokes.  Around 2 weeks ago had visual disturbance difficulty speaking and some unsteadiness.  Outpatient MRI showed possible strokes.  Sent in by neurology.  No acute deficits.  Not a TPA candidate due to this.  However will need more acute work-up.  Seen by neurology.  Recommended LP.  Patient request done by fluoroscopy instead of by me.  This has been arranged.  Admit to internal medicine  Final Clinical Impression(s) / ED Diagnoses Final diagnoses:  Cerebrovascular accident (CVA), unspecified mechanism [facility])    Rx / [place] Orders ED Discharge Orders    None       Davonna Belling, MD 07/27/20 1425

## 2020-07-27 NOTE — H&P (Addendum)
History and Physical    Marissa Burnett:[id number] DOB: 22-Apr-1986 DOA: 07/27/2020  Referring MD/NP/PA: Davonna Belling, MD PCP: [name], MD  Patient coming from: Home  Chief Complaint: Abnormal MRI  I have personally briefly reviewed patient's old medical records in [place]   HPI: Marissa Burnett is a 34 y.o. right-handed female with medical history significant of dysrhythmia, anxiety, chronic lumbar pain, and GERD presents after recently abnormal MRI brain.  Symptoms started approximately 2 weeks ago after getting back from a trip from [place].  She reports having two episodes with the first being the worst.  With the first episode she reported being unable to get her words out, text on her phone, numbness feelings with possible weakness in her hand for which she was unable to open the bathroom door, blurry vision, and a headache that she rated as a 4-5 out of 10.  Her coworker said that she was very pale in color when they saw her.  Symptoms may have lasted 5-10 minutes or so before starting to gradually improve .  Patient reportedly took an over-the-counter medicine for her headache, but started to feel somewhat better thereafter.  The following day she reported having a second episode with blurry vision and headache for which she took a Xanax and ibuprofen with improvement in symptoms.  Since that time she has had intermittent headaches, but not that severe.  Family history is significant for a second cousin with history of MS.  She was seen by her primary care provider 9 days ago and a MRI of her brain was ordered for further evaluation.  MRI was obtained yesterday and records note that it revealed 2 small areas of diffusion signal in the left posterior frontal cortex concerning for areas of recent infarction with 2 linear areas of enhancement in the posterior left temporal lobe and left parietal lobe likely areas of subacute infarction.  Patient was called this morning and  instructed to immediately come to the emergency department.  She denies any residual weakness or numbness feelings.  She has anxiety at baseline and intermittently gets palpitations.  Denies any illicit drug use and drinks alcohol intermittently on the weekends.  ED Course: Upon admission into the emergency department patient was seen to have a temperature 99.1 F, pulse 86-1 10, and all other vital signs relatively maintained.  Labs consisting of CBC with differential, PT/INR, and CMP are relatively unremarkable.  Urinalysis negative for any acute abnormalities and UDS positive for benzodiazepine which are on the patient's home medication list.  Neurology have been formally consulted.  Patient was ordered to have a lumbar puncture by fluoroscopy.  Review of Systems  Constitutional: Negative for fever, malaise/fatigue and weight loss.  HENT: Positive for congestion (Just recently got over a cold tested and was negative for Covid). Negative for ear discharge and nosebleeds.   Eyes: Positive for blurred vision. Negative for pain.  Respiratory: Negative for shortness of breath and wheezing.   Cardiovascular: Positive for palpitations. Negative for chest pain and leg swelling.  Gastrointestinal: Positive for nausea (With episodes of headache). Negative for abdominal pain, diarrhea and vomiting.  Genitourinary: Negative for dysuria and hematuria.  Musculoskeletal: Positive for back pain. Negative for falls and myalgias.  Skin: Negative for itching and rash.  Neurological: Positive for sensory change, speech change, focal weakness and headaches. Negative for loss of consciousness.  Endo/Heme/Allergies: Positive for environmental allergies.  Psychiatric/Behavioral: Negative for substance abuse. The patient is nervous/anxious.  Past Medical History:  Diagnosis Date  . Abnormal Papanicolaou smear of cervix 10/15/2016   LSIL+HPV  . Allergy   . Anxiety   . Chronic lumbar pain 09/27/2018   Patient  has seen Dr. Arnoldo Morale neurosurgery September 2019-they do not feel there is anything surgical that can help-they are recommending injections with pain medicine specialist  . Dysrhythmia    history of palpitations   . Gallstones   . GERD (gastroesophageal reflux disease)   . Heart murmur birth    Past Surgical History:  Procedure Laterality Date  . ABDOMINOPLASTY    . BREAST ENHANCEMENT SURGERY    . CERVICAL CONIZATION W/BX N/A 12/13/2016   Procedure: CONIZATION CERVIX WITH BIOPSY--cold knife conization;  Surgeon: Jonnie Kind, MD;  Location: [facility];  Service: Gynecology;  Laterality: N/A;  . CHOLECYSTECTOMY  02/2002  . ENDOSCOPIC RETROGRADE CHOLANGIOPANCREATOGRAPHY (ERCP) WITH PROPOFOL    . OTHER SURGICAL HISTORY     stent in kidney during pregnancy,2002; removed in 2003     reports that she has never smoked. She has never used smokeless tobacco. She reports current alcohol use. She reports that she does not use drugs.  Allergies  Allergen Reactions  . Peanut-Containing Drug Products Anaphylaxis    THROAT GETS TIGHT, peanut butter    Family History  Problem Relation Age of Onset  . Diabetes Other   . Lung cancer Paternal Grandfather   . Diabetes Maternal Grandmother   . COPD Maternal Grandfather   . Heart disease Paternal Grandmother   . Colon polyps Paternal Grandmother   . Irritable bowel syndrome Paternal Grandmother   . ADD / ADHD Daughter   . Anxiety disorder Daughter   . Allergic rhinitis Neg Hx   . Angioedema Neg Hx   . Asthma Neg Hx   . Atopy Neg Hx   . Eczema Neg Hx   . Immunodeficiency Neg Hx   . Urticaria Neg Hx     Prior to Admission medications   Medication Sig Start Date End Date Taking? Authorizing Provider  ALPRAZolam (XANAX) 0.5 MG tablet TAKE 1 TABLET(0.5 MG) BY MOUTH TWICE DAILY AS NEEDED FOR ANXIETY Patient taking differently: Take 0.5 mg by mouth 2 (two) times daily as needed for anxiety.  06/26/20  Yes Pearson Forster C, NP  BIOTIN PO Take 1  tablet by mouth daily.   Yes [provider]  loratadine (CLARITIN) 10 MG tablet Take 10 mg by mouth daily.   Yes [provider]  LORazepam (ATIVAN) 1 MG tablet 1 taken 1 hour before procedure no driving Patient taking differently: Take 1 mg by mouth See admin instructions. 1 taken 1 hour before procedure no driving 0/92/33  Yes Luking, Scott A, MD  ondansetron (ZOFRAN) 8 MG tablet Take 1 tablet (8 mg total) by mouth every 8 (eight) hours as needed for nausea. 07/18/20  Yes Luking, Scott A, MD  pantoprazole (PROTONIX) 40 MG tablet TAKE 1 TABLET (40 MG TOTAL) BY MOUTH DAILY. 06/01/20  Yes Luking, Elayne Snare, MD  SUMAtriptan (IMITREX) 50 MG tablet Take 1 tablet (50 mg total) by mouth every 2 (two) hours as needed for migraine or headache. May repeat in 2 hours if headache persists or recurs. 07/18/20  Yes Luking, Scott A, MD  EPINEPHrine 0.3 mg/0.3 mL IJ SOAJ injection Inject 0.3 mg into the muscle once as needed for anaphylaxis.     [provider]    Physical Exam:  Constitutional: Female currently in no acute distress Vitals:  07/27/20 1216 07/27/20 1217 07/27/20 1218 07/27/20 1322  BP:      Pulse: 97 96 100   Resp: 21 22 22    Temp:    (S) 99.1 F (37.3 C)  SpO2: 98% 97% 95%    Eyes: PERRL, lids and conjunctivae normal ENMT: Mucous membranes are moist. Posterior pharynx clear of any exudate or lesions.  Neck: normal, supple, no masses, no thyromegaly Respiratory: clear to auscultation bilaterally, no wheezing, no crackles. Normal respiratory effort. No accessory muscle use.  Cardiovascular: Regular rate and rhythm, no murmurs / rubs / gallops. No extremity edema. 2+ pedal pulses. No carotid bruits.  Abdomen: no tenderness, no masses palpated. No hepatosplenomegaly. Bowel sounds positive.  Musculoskeletal: no clubbing / cyanosis. No joint deformity upper and lower extremities. Good ROM, no contractures. Normal muscle tone.  Skin: no rashes, lesions, ulcers. No  induration Neurologic: CN 2-12 grossly intact. Sensation intact. Psychiatric: Normal judgment and insight. Alert and oriented x 3. Normal mood.     Labs on Admission: I have personally reviewed following labs and imaging studies  CBC: Recent Labs  Lab 07/27/20 1153  WBC 5.5  NEUTROABS 3.9  HGB 14.1  HCT 42.2  MCV 93.0  PLT 557   Basic Metabolic Panel: Recent Labs  Lab 07/27/20 1153  NA 137  K 3.6  CL 105  CO2 21*  GLUCOSE 109*  BUN 12  CREATININE 0.72  CALCIUM 9.1   GFR: Estimated Creatinine Clearance: 99.3 mL/min (by C-G formula based on SCr of 0.72 mg/dL). Liver Function Tests: Recent Labs  Lab 07/27/20 1153  AST 18  ALT 13  ALKPHOS 55  BILITOT 1.0  PROT 7.3  ALBUMIN 4.1   No results for input(s): LIPASE, AMYLASE in the last 168 hours. No results for input(s): AMMONIA in the last 168 hours. Coagulation Profile: Recent Labs  Lab 07/27/20 1153  INR 1.0   Cardiac Enzymes: No results for input(s): CKTOTAL, CKMB, CKMBINDEX, TROPONINI in the last 168 hours. BNP (last 3 results) No results for input(s): PROBNP in the last 8760 hours. HbA1C: No results for input(s): HGBA1C in the last 72 hours. CBG: No results for input(s): GLUCAP in the last 168 hours. Lipid Profile: No results for input(s): CHOL, HDL, LDLCALC, TRIG, CHOLHDL, LDLDIRECT in the last 72 hours. Thyroid Function Tests: No results for input(s): TSH, T4TOTAL, FREET4, T3FREE, THYROIDAB in the last 72 hours. Anemia Panel: No results for input(s): VITAMINB12, FOLATE, FERRITIN, TIBC, IRON, RETICCTPCT in the last 72 hours. Urine analysis:    Component Value Date/Time   COLORURINE STRAW (A) 07/27/2020 1153   APPEARANCEUR CLEAR 07/27/2020 1153   LABSPEC 1.006 07/27/2020 1153   PHURINE 6.0 07/27/2020 1153   GLUCOSEU NEGATIVE 07/27/2020 1153   HGBUR NEGATIVE 07/27/2020 1153   BILIRUBINUR NEGATIVE 07/27/2020 1153   KETONESUR NEGATIVE 07/27/2020 1153   PROTEINUR NEGATIVE 07/27/2020 1153    UROBILINOGEN 0.2 05/04/2009 1010   NITRITE NEGATIVE 07/27/2020 1153   LEUKOCYTESUR NEGATIVE 07/27/2020 1153   Sepsis Labs: No results found for this or any previous visit (from the past 240 hour(s)).   Radiological Exams on Admission: MR Brain W Wo Contrast  Result Date: 07/26/2020 CLINICAL DATA:  Headache. Speech disturbance and migraine headaches. EXAM: MRI HEAD WITHOUT AND WITH CONTRAST TECHNIQUE: Multiplanar, multiecho pulse sequences of the brain and surrounding structures were obtained without and with intravenous contrast. CONTRAST:  7.31mL GADAVIST GADOBUTROL 1 MMOL/ML IV SOLN COMPARISON:  CT head 10/08/2005 FINDINGS: Brain: Small areas of diffusion signal in the left  posterior frontal cortex without FLAIR signal or enhancement. Small area of diffusion signal in the left posterior temporal cortex associated with linear ribbon like cortical enhancement. The enhancing area measures approximately 8 mm in the diffusion signal is approximately 4 mm. No significant abnormal FLAIR signal in this region. Small linear area of cortical enhancement in the left parietal lobe measuring approximately 10 mm. No significant restricted diffusion or abnormal FLAIR signal. Ventricle size normal. Negative for hemorrhage. No evidence of demyelinating disease or chronic ischemia. Vascular: Normal arterial flow voids Skull and upper cervical spine: Normal bone marrow. Sinuses/Orbits: Paranasal sinuses clear.  Negative orbit Other: None IMPRESSION: Two small areas of diffusion signal in the left posterior frontal cortex likely areas of recent infarction. Due to small size it is difficult to determine if there is restricted diffusion. Two linear areas of enhancement in the posterior left temporal lobe and left parietal lobe likely areas of subacute infarction. Correlate with neurologic exam and symptoms. The enhancing lesions are not likely to represent metastatic disease given their linear morphology. Short term MRI  follow-up in 6-8 weeks may be helpful. Electronically Signed   By: Franchot Gallo M.D.   On: 07/26/2020 20:14    EKG: Independently reviewed.  Sinus rhythm at 99 bpm  Assessment/Plan Abnormal MRI Suspected CVA: Acute.  Patient presents after having to episodes of aphasia, apraxia, paresthesias, change in vision, and headache.  MRI significant for 2 small areas of diffusion in the left posterior frontal cortex concerning for infarct with 2 areas of enhancement in the posterior left temporal lobe and parietal lobe concerning for subacute infarcts.  Neurology consultation unclear if actual strokes, infectious, or autoimmune origin.  She does report a patient history of second cousin with MS.  Underwent LP. -Admit to medical telemetry -Neurochecks -Check lipid panel and hemoglobin A1c in a.m. -Check CT angiogram of the head and neck -Check echocardiogram -Follow-up LP and pending studies recommended by neurology -[facility] consultative services, we will follow-up for further recommendation  Migraine headaches: Her primary care provider had just recently prescribed sumatriptan 50 mg as needed for migraine headaches.  She had not taking this medication as of yet.  Question of possibility of complex migraine for strokelike symptoms, but does not explain findings. -Continue sumatriptan as needed  Anxiety: Home medications include Xanax 0.5 mg as needed twice daily for anxiety. -Continue Xanax as needed  GERD -Continue Protonix  DVT prophylaxis: Lovenox Code Status: Full Family Communication: Husband updated at bedside Disposition Plan: Likely discharge home in 1 to 2 days if work-up negative Consults called: Neurology Admission status: Inpatient  Norval Morton MD [facility] Pager [phone]   If 7PM-7AM, please contact night-coverage [email or web address] Password [facility]  07/27/2020, 2:35 PM

## 2020-07-28 ENCOUNTER — Other Ambulatory Visit: Payer: Self-pay | Admitting: Neurology

## 2020-07-28 ENCOUNTER — Inpatient Hospital Stay (HOSPITAL_COMMUNITY): Payer: No Typology Code available for payment source

## 2020-07-28 DIAGNOSIS — Q211 Atrial septal defect, unspecified: Secondary | ICD-10-CM

## 2020-07-28 DIAGNOSIS — G43809 Other migraine, not intractable, without status migrainosus: Secondary | ICD-10-CM | POA: Diagnosis not present

## 2020-07-28 DIAGNOSIS — F419 Anxiety disorder, unspecified: Secondary | ICD-10-CM | POA: Diagnosis not present

## 2020-07-28 DIAGNOSIS — R519 Headache, unspecified: Secondary | ICD-10-CM

## 2020-07-28 DIAGNOSIS — I63412 Cerebral infarction due to embolism of left middle cerebral artery: Secondary | ICD-10-CM

## 2020-07-28 DIAGNOSIS — I639 Cerebral infarction, unspecified: Secondary | ICD-10-CM

## 2020-07-28 HISTORY — DX: Atrial septal defect, unspecified: Q21.10

## 2020-07-28 HISTORY — DX: Atrial septal defect: Q21.1

## 2020-07-28 LAB — IGG CSF INDEX
Albumin CSF-mCnc: 12 mg/dL (ref 7–29)
Albumin: 4.6 g/dL (ref 3.8–4.8)
CSF IgG Index: 0.5 (ref 0.0–0.7)
IgG (Immunoglobin G), Serum: 999 mg/dL (ref 586–1602)
IgG, CSF: 1.3 mg/dL (ref 0.0–6.7)
IgG/Alb Ratio, CSF: 0.11 (ref 0.00–0.25)

## 2020-07-28 LAB — HEPATITIS C ANTIBODY: HCV Ab: NONREACTIVE

## 2020-07-28 LAB — ECHOCARDIOGRAM COMPLETE
Height: 64 in
Weight: 2673.74 oz

## 2020-07-28 LAB — LIPID PANEL
Cholesterol: 135 mg/dL (ref 0–200)
HDL: 57 mg/dL (ref >40)
LDL Cholesterol: 69 mg/dL (ref 0–99)
Total CHOL/HDL Ratio: 2.4 RATIO
Triglycerides: 46 mg/dL (ref <150)
VLDL: 9 mg/dL (ref 0–40)

## 2020-07-28 LAB — VDRL, CSF: VDRL Quant, CSF: NONREACTIVE

## 2020-07-28 LAB — C-REACTIVE PROTEIN: CRP: 0.5 mg/dL (ref <1.0)

## 2020-07-28 LAB — HEMOGLOBIN A1C
Hgb A1c MFr Bld: 4.7 % — ABNORMAL LOW (ref 4.8–5.6)
Mean Plasma Glucose: 88.19 mg/dL

## 2020-07-28 LAB — HIV ANTIBODY (ROUTINE TESTING W REFLEX): HIV Screen 4th Generation wRfx: NONREACTIVE

## 2020-07-28 LAB — ANGIOTENSIN CONVERTING ENZYME: Angiotensin-Converting Enzyme: 31 U/L (ref 14–82)

## 2020-07-28 LAB — PROTEIN C, TOTAL: Protein C, Total: 102 % (ref 60–150)

## 2020-07-28 LAB — ANTITHROMBIN III: AntiThromb III Func: 104 % (ref 75–120)

## 2020-07-28 LAB — RPR: RPR Ser Ql: NONREACTIVE

## 2020-07-28 LAB — SEDIMENTATION RATE: Sed Rate: 5 mm/hr (ref 0–22)

## 2020-07-28 MED ORDER — ASPIRIN 81 MG PO TBEC: 81.0000 mg | DELAYED_RELEASE_TABLET | Freq: Every day | ORAL | 11 refills | Status: DC

## 2020-07-28 MED ORDER — CALCIUM CARBONATE ANTACID 500 MG PO CHEW
1.0000 | CHEWABLE_TABLET | Freq: Four times a day (QID) | ORAL | Status: DC | PRN
  Administered 2020-07-28: 200 mg via ORAL
  Filled 2020-07-28: qty 1

## 2020-07-28 MED ORDER — CLOPIDOGREL BISULFATE 75 MG PO TABS: 75.0000 mg | ORAL_TABLET | Freq: Every day | ORAL | 0 refills | Status: DC

## 2020-07-28 MED ORDER — CLOPIDOGREL BISULFATE 75 MG PO TABS
75.0000 mg | ORAL_TABLET | Freq: Every day | ORAL | Status: DC
  Administered 2020-07-28: 75 mg via ORAL
  Filled 2020-07-28: qty 1

## 2020-07-28 MED ORDER — ASPIRIN EC 81 MG PO TBEC
81.0000 mg | DELAYED_RELEASE_TABLET | Freq: Every day | ORAL | Status: DC
  Administered 2020-07-28: 81 mg via ORAL
  Filled 2020-07-28: qty 1

## 2020-07-28 MED FILL — CLOPIDOGREL 75 MG TABLET: 75 | 30 days supply | Qty: 30 | Fill #0

## 2020-07-28 MED FILL — SM ASPIRIN EC 81 MG TABLET: 81 | 30 days supply | Qty: 30 | Fill #0

## 2020-07-28 NOTE — Discharge Summary (Signed)
Physician Discharge Summary  Marissa Burnett:[id number] DOB: Sep 15, 1986 DOA: 07/27/2020  PCP: [name], MD  Admit date: 07/27/2020 Discharge date: 07/28/2020  Admitted From: Home Disposition: Home  Recommendations for Outpatient Follow-up:  1. Follow up with PCP in 1-2 weeks, cardiology in the next week for TEE as discussed 2. Please obtain BMP/CBC in one week  Discharge Condition: Stable CODE STATUS: Full Diet recommendation: As tolerated  Brief/Interim Summary: Marissa Burnett is a 34 y.o. right-handed female with medical history significant of dysrhythmia, anxiety, chronic lumbar pain, and GERD presents after recently abnormal MRI brain.  Symptoms started approximately 2 weeks ago after getting back from a trip from [place].  She reports having two episodes with the first being the worst.  With the first episode she reported being unable to get her words out, text on her phone, numbness feelings with possible weakness in her hand for which she was unable to open the bathroom door, blurry vision, and a headache that she rated as a 4-5 out of 10.  Her coworker said that she was very pale in color when they saw her.  Symptoms may have lasted 5-10 minutes or so before starting to gradually improve .  Patient reportedly took an over-the-counter medicine for her headache, but started to feel somewhat better thereafter.  The following day she reported having a second episode with blurry vision and headache for which she took a Xanax and ibuprofen with improvement in symptoms.  Since that time she has had intermittent headaches, but not that severe.  Family history is significant for a second cousin with history of MS.  She was seen by her primary care provider 9 days ago and a MRI of her brain was ordered for further evaluation.  MRI was obtained yesterday and records note that it revealed 2 small areas of diffusion signal in the left posterior frontal cortex concerning for areas of recent  infarction with 2 linear areas of enhancement in the posterior left temporal lobe and left parietal lobe likely areas of subacute infarction.  Patient was called this morning and instructed to immediately come to the emergency department.  She denies any residual weakness or numbness feelings.  She has anxiety at baseline and intermittently gets palpitations.  Denies any illicit drug use and drinks alcohol intermittently on the weekends.Upon admission into the emergency department patient was seen to have a temperature 99.1 F, pulse 86-1 10, and all other vital signs relatively maintained.  Labs consisting of CBC with differential, PT/INR, and CMP are relatively unremarkable.  Urinalysis negative for any acute abnormalities and UDS positive for benzodiazepine which are on the patient's home medication list.  Neurology have been formally consulted.  Patient was ordered to have a lumbar puncture by fluoroscopy.  Patient met as above with acute transient episode of blurry vision, left hand weakness and dysarthria.  This occurred during appears to be a migraine, initially there was concern for complex migraine with vision changes and paresthesias, patient did have routine TIA/CVA work-up which MRI did confirm a very tiny punctate opacification concerning for CVA but also given his small size cannot be ruled out for early vasculitis or inflammatory/autoimmune disorder.  During further work-up patient was found to have notable PFO likely the cause of her presumed tiny punctate stroke.  Again patient's lesion is small and appears to be unrelated spatially to her deficits, at this point vasculitis and LP studies are still pending which will need to be followed up in the  outpatient setting with neurology.  Given her associated symptoms with migraine it is still fair to say this is likely a complex migraine with incidentally noted CVA with PFO.  Patient will follow closely with cardiology in the next week for TEE to  further evaluate PFO and follow-up with cardiac surgery thereafter for PFO closure.  Patient to follow with neurology as scheduled, will continue Plavix and aspirin per recommendations per neurology given acute findings for stroke, patient otherwise stable and agreeable for discharge home with close follow-up as outlined above.  Discharge Diagnoses:  Principal Problem:   Abnormal MRI Active Problems:   Anxiety   CVA (cerebral vascular accident) ([place])   GERD (gastroesophageal reflux disease)   Migraine    Discharge Instructions  Discharge Instructions    Call MD for:  difficulty breathing, headache or visual disturbances   Complete by: As directed    Call MD for:  extreme fatigue   Complete by: As directed    Call MD for:  hives   Complete by: As directed    Call MD for:  persistant dizziness or light-headedness   Complete by: As directed    Call MD for:  persistant nausea and vomiting   Complete by: As directed    Call MD for:  severe uncontrolled pain   Complete by: As directed    Call MD for:  temperature >100.4   Complete by: As directed    Diet - low sodium heart healthy   Complete by: As directed    Increase activity slowly   Complete by: As directed      Allergies as of 07/28/2020      Reactions   Peanut-containing Drug Products Anaphylaxis   THROAT GETS TIGHT, peanut butter      Medication List    TAKE these medications   ALPRAZolam 0.5 MG tablet Commonly known as: XANAX TAKE 1 TABLET(0.5 MG) BY MOUTH TWICE DAILY AS NEEDED FOR ANXIETY What changed: See the new instructions.   aspirin 81 MG EC tablet Take 1 tablet (81 mg total) by mouth daily. Swallow whole. Start taking on: July 29, 2020   BIOTIN PO Take 1 tablet by mouth daily.   clopidogrel 75 MG tablet Commonly known as: Plavix Take 1 tablet (75 mg total) by mouth daily.   EPINEPHrine 0.3 mg/0.3 mL Soaj injection Commonly known as: EPI-PEN Inject 0.3 mg into the muscle once as needed for  anaphylaxis.   loratadine 10 MG tablet Commonly known as: CLARITIN Take 10 mg by mouth daily.   LORazepam 1 MG tablet Commonly known as: ATIVAN 1 taken 1 hour before procedure no driving What changed:   how much to take  how to take this  when to take this   ondansetron 8 MG tablet Commonly known as: Zofran Take 1 tablet (8 mg total) by mouth every 8 (eight) hours as needed for nausea.   pantoprazole 40 MG tablet Commonly known as: PROTONIX TAKE 1 TABLET (40 MG TOTAL) BY MOUTH DAILY.   SUMAtriptan 50 MG tablet Commonly known as: Imitrex Take 1 tablet (50 mg total) by mouth every 2 (two) hours as needed for migraine or headache. May repeat in 2 hours if headache persists or recurs.       Allergies  Allergen Reactions  . Peanut-Containing Drug Products Anaphylaxis    THROAT GETS TIGHT, peanut butter    Consultations:  Neurology Dr. Cheral Marker   Procedures/Studies: CT ANGIO HEAD W OR WO CONTRAST  Result Date: 07/27/2020 CLINICAL DATA:  34 year old female with headache, speech disturbance. Abnormal brain MRI earlier today. Status post fluoroscopic guided lumbar puncture earlier today. EXAM: CT ANGIOGRAPHY HEAD AND NECK TECHNIQUE: Multidetector CT imaging of the head and neck was performed using the standard protocol during bolus administration of intravenous contrast. Multiplanar CT image reconstructions and MIPs were obtained to evaluate the vascular anatomy. Carotid stenosis measurements (when applicable) are obtained utilizing NASCET criteria, using the distal internal carotid diameter as the denominator. CONTRAST:  192m OMNIPAQUE IOHEXOL 350 MG/ML SOLN COMPARISON:  None. FINDINGS: CT HEAD Brain: Normal cerebral volume. No midline shift, ventriculomegaly, mass effect, evidence of mass lesion, intracranial hemorrhage or evidence of cortically based acute infarction. Gray-white matter differentiation is within normal limits throughout the brain. Calvarium and skull base:  Negative. Paranasal sinuses: Visualized paranasal sinuses and mastoids are stable and well pneumatized. Orbits: Visualized orbits and scalp soft tissues are within normal limits. Suboptimal arterial contrast timing on the CTA portion, with somewhat venous dominant timing in both the head and the neck. CTA NECK Skeleton: Negative. Upper chest: Negative. Other neck: Negative. Aortic arch: 4 vessel arch configuration, the left vertebral arises directly from the arch. No arch atherosclerosis. Right carotid system: Negative aside from right ICA tortuosity just below the skull base. Left carotid system: Negative aside from left ICA tortuosity just below the skull base. Vertebral arteries: Normal proximal right subclavian artery, cervical right vertebral artery. The left vertebral arises directly from the arch and is non dominant, but otherwise norm to the skull base. CTA HEAD Posterior circulation: Dominant right vertebral V4 segment. Both PICA origins appear patent. No distal vertebral stenosis. Patent vertebrobasilar junction and basilar artery without stenosis. SCA and PCA origins are within normal limits. Both posterior communicating arteries are present, the right is diminutive. Bilateral PCA branches are within normal limits. Anterior circulation: Both ICA siphons are patent and appear normal. Normal posterior communicating artery origins. Mildly tortuous left ophthalmic artery origin suspected (normal variant). Patent carotid termini. Normal MCA and ACA origins. Anterior communicating artery and bilateral ACA branches are within normal limits. Bilateral MCA M1 segments, MCA bifurcations, and MCA branches are within normal limits. Venous sinuses: Patent, venous dominant intracranial contrast timing. The right transverse and sigmoid sinuses appear dominant. Anatomic variants: Left vertebral artery arises directly from the arch and is non dominant. Review of the MIP images confirms the above findings IMPRESSION: 1.  Suboptimal contrast timing for CTA head and neck but no vascular abnormality identified; both ICAs are tortuous at the skull base. 2.  Normal CT appearance of the brain. Electronically Signed   By: HGenevie AnnM.D.   On: 07/27/2020 19:56   CT ANGIO NECK W OR WO CONTRAST  Result Date: 07/27/2020 CLINICAL DATA:  35year old female with headache, speech disturbance. Abnormal brain MRI earlier today. Status post fluoroscopic guided lumbar puncture earlier today. EXAM: CT ANGIOGRAPHY HEAD AND NECK TECHNIQUE: Multidetector CT imaging of the head and neck was performed using the standard protocol during bolus administration of intravenous contrast. Multiplanar CT image reconstructions and MIPs were obtained to evaluate the vascular anatomy. Carotid stenosis measurements (when applicable) are obtained utilizing NASCET criteria, using the distal internal carotid diameter as the denominator. CONTRAST:  1066mOMNIPAQUE IOHEXOL 350 MG/ML SOLN COMPARISON:  None. FINDINGS: CT HEAD Brain: Normal cerebral volume. No midline shift, ventriculomegaly, mass effect, evidence of mass lesion, intracranial hemorrhage or evidence of cortically based acute infarction. Gray-white matter differentiation is within normal limits throughout the brain. Calvarium and skull base: Negative. Paranasal sinuses: Visualized  paranasal sinuses and mastoids are stable and well pneumatized. Orbits: Visualized orbits and scalp soft tissues are within normal limits. Suboptimal arterial contrast timing on the CTA portion, with somewhat venous dominant timing in both the head and the neck. CTA NECK Skeleton: Negative. Upper chest: Negative. Other neck: Negative. Aortic arch: 4 vessel arch configuration, the left vertebral arises directly from the arch. No arch atherosclerosis. Right carotid system: Negative aside from right ICA tortuosity just below the skull base. Left carotid system: Negative aside from left ICA tortuosity just below the skull base. Vertebral  arteries: Normal proximal right subclavian artery, cervical right vertebral artery. The left vertebral arises directly from the arch and is non dominant, but otherwise norm to the skull base. CTA HEAD Posterior circulation: Dominant right vertebral V4 segment. Both PICA origins appear patent. No distal vertebral stenosis. Patent vertebrobasilar junction and basilar artery without stenosis. SCA and PCA origins are within normal limits. Both posterior communicating arteries are present, the right is diminutive. Bilateral PCA branches are within normal limits. Anterior circulation: Both ICA siphons are patent and appear normal. Normal posterior communicating artery origins. Mildly tortuous left ophthalmic artery origin suspected (normal variant). Patent carotid termini. Normal MCA and ACA origins. Anterior communicating artery and bilateral ACA branches are within normal limits. Bilateral MCA M1 segments, MCA bifurcations, and MCA branches are within normal limits. Venous sinuses: Patent, venous dominant intracranial contrast timing. The right transverse and sigmoid sinuses appear dominant. Anatomic variants: Left vertebral artery arises directly from the arch and is non dominant. Review of the MIP images confirms the above findings IMPRESSION: 1. Suboptimal contrast timing for CTA head and neck but no vascular abnormality identified; both ICAs are tortuous at the skull base. 2.  Normal CT appearance of the brain. Electronically Signed   By: Genevie Ann M.D.   On: 07/27/2020 19:56   MR Brain W Wo Contrast  Result Date: 07/26/2020 CLINICAL DATA:  Headache. Speech disturbance and migraine headaches. EXAM: MRI HEAD WITHOUT AND WITH CONTRAST TECHNIQUE: Multiplanar, multiecho pulse sequences of the brain and surrounding structures were obtained without and with intravenous contrast. CONTRAST:  7.23m GADAVIST GADOBUTROL 1 MMOL/ML IV SOLN COMPARISON:  CT head 10/08/2005 FINDINGS: Brain: Small areas of diffusion signal in the  left posterior frontal cortex without FLAIR signal or enhancement. Small area of diffusion signal in the left posterior temporal cortex associated with linear ribbon like cortical enhancement. The enhancing area measures approximately 8 mm in the diffusion signal is approximately 4 mm. No significant abnormal FLAIR signal in this region. Small linear area of cortical enhancement in the left parietal lobe measuring approximately 10 mm. No significant restricted diffusion or abnormal FLAIR signal. Ventricle size normal. Negative for hemorrhage. No evidence of demyelinating disease or chronic ischemia. Vascular: Normal arterial flow voids Skull and upper cervical spine: Normal bone marrow. Sinuses/Orbits: Paranasal sinuses clear.  Negative orbit Other: None IMPRESSION: Two small areas of diffusion signal in the left posterior frontal cortex likely areas of recent infarction. Due to small size it is difficult to determine if there is restricted diffusion. Two linear areas of enhancement in the posterior left temporal lobe and left parietal lobe likely areas of subacute infarction. Correlate with neurologic exam and symptoms. The enhancing lesions are not likely to represent metastatic disease given their linear morphology. Short term MRI follow-up in 6-8 weeks may be helpful. Electronically Signed   By: CFranchot GalloM.D.   On: 07/26/2020 20:14   VAS [place] DOPPLER W BUBBLES  Result Date: 07/28/2020  Transcranial Doppler with Bubble Indications: Stroke. Performing Technologist: Oda Cogan RDMS, RVT  Examination Guidelines: A complete evaluation includes B-mode imaging, spectral Doppler, color Doppler, and power Doppler as needed of all accessible portions of each vessel. Bilateral testing is considered an integral part of a complete examination. Limited examinations for reoccurring indications may be performed as noted.  Summary:  A vascular evaluation was performed. The right middle cerebral artery  was studied. An IV was inserted into the patient's left Forearm. Verbal informed consent was obtained.  TCD bubby study was positive. HITS heard at rest and during Valsalva. PFO Spencer grade 3 at rest, and 4 during Valsalva. Dr. Erlinda Hong performed. *See table(s) above for TCD measurements and observations.    Preliminary    VAS [place] LOWER EXTREMITY VENOUS REFLUX  Result Date: 07/10/2020  Lower Venous Reflux Study Indications: Varicosities.  Performing Technologist: Alvia Grove RVT  Examination Guidelines: A complete evaluation includes B-mode imaging, spectral Doppler, color Doppler, and power Doppler as needed of all accessible portions of each vessel. Bilateral testing is considered an integral part of a complete examination. Limited examinations for reoccurring indications may be performed as noted. The reflux portion of the exam is performed with the patient in reverse Trendelenburg. Significant venous reflux is defined as >500 ms in the superficial venous system, and >1 second in the deep venous system.  Venous Reflux Times +--------------+---------+------+-----------+------------+--------+ RIGHT         Reflux NoRefluxReflux TimeDiameter cmsComments                         Yes                                  +--------------+---------+------+-----------+------------+--------+ GSV at SFJ                                  0.54             +--------------+---------+------+-----------+------------+--------+ GSV prox thigh                              0.44             +--------------+---------+------+-----------+------------+--------+ GSV mid thigh           yes    >500 ms      0.37             +--------------+---------+------+-----------+------------+--------+ GSV dist thigh                              0.31             +--------------+---------+------+-----------+------------+--------+ GSV at knee                                 0.29              +--------------+---------+------+-----------+------------+--------+ GSV prox calf                               0.30             +--------------+---------+------+-----------+------------+--------+ GSV mid calf  0.23             +--------------+---------+------+-----------+------------+--------+ SSV Pop Fossa                               0.27             +--------------+---------+------+-----------+------------+--------+ SSV prox calf                               0.25             +--------------+---------+------+-----------+------------+--------+ SSV mid calf                                0.16             +--------------+---------+------+-----------+------------+--------+                                             0.21             +--------------+---------+------+-----------+------------+--------+  +--------------+---------+------+-----------+------------+--------+ LEFT          Reflux NoRefluxReflux TimeDiameter cmsComments                         Yes                                  +--------------+---------+------+-----------+------------+--------+ GSV at SFJ                                  0.58             +--------------+---------+------+-----------+------------+--------+ GSV prox thigh                              0.73             +--------------+---------+------+-----------+------------+--------+ GSV mid thigh                               0.27             +--------------+---------+------+-----------+------------+--------+ GSV dist thigh                              0.23             +--------------+---------+------+-----------+------------+--------+ GSV at knee                                 0.22             +--------------+---------+------+-----------+------------+--------+ GSV prox calf           yes    >500 ms      0.18              +--------------+---------+------+-----------+------------+--------+ GSV mid calf            yes    >500 ms      0.16             +--------------+---------+------+-----------+------------+--------+  SSV Pop Fossa                               0.27             +--------------+---------+------+-----------+------------+--------+ SSV prox calf                               0.18             +--------------+---------+------+-----------+------------+--------+ SSV mid calf                                0.21             +--------------+---------+------+-----------+------------+--------+                                             0.16             +--------------+---------+------+-----------+------------+--------+   Summary: Right: - No evidence of deep vein thrombosis seen in the right lower extremity, from the common femoral through the popliteal veins. - No evidence of superficial venous reflux seen in the right short saphenous vein. - Venous reflux is noted in the right greater saphenous vein in the thigh.  Left: - No evidence of deep vein thrombosis seen in the left lower extremity, from the common femoral through the popliteal veins. - No evidence of superficial venous reflux seen in the left short saphenous vein. - Venous reflux is noted in the left greater saphenous vein in the calf.  *See table(s) above for measurements and observations. Electronically signed by Harold Barban MD on 07/10/2020 at 3:27:11 PM.    Final    DG FL GUIDED LUMBAR PUNCTURE  Result Date: 07/27/2020 CLINICAL DATA:  Headaches, speech impairment, and weakness for 2 weeks. Abnormal brain MRI. EXAM: DIAGNOSTIC LUMBAR PUNCTURE UNDER FLUOROSCOPIC GUIDANCE FLUOROSCOPY TIME:  Fluoroscopy Time:  0 minutes 36 seconds Radiation Exposure Index (if provided by the fluoroscopic device): 4.8 mGy Number of Acquired Spot Images: 0 PROCEDURE: Informed consent was obtained from the patient prior to the procedure, including  potential complications of headache, allergy, and pain. With the patient prone, the lower back was prepped with Betadine. 1% Lidocaine was used for local anesthesia. Lumbar puncture was performed at the L3-4 level using a 20 gauge needle with return of clear CSF with an opening pressure of 14 cm water. 14 ml of CSF were obtained for laboratory studies. The patient tolerated the procedure well and there were no apparent complications. IMPRESSION: Successful diagnostic lumbar puncture under fluoroscopic guidance. No immediate complications. Electronically Signed   By: Marlaine Hind M.D.   On: 07/27/2020 16:35    Subjective: No acute issues/events overnight, denies chest pain, shortness of breath, fevers, chills - previous headache now resolving.   Discharge Exam: Vitals:   07/28/20 1122 07/28/20 1513  BP: 119/82 (!) 131/88  Pulse: 89 97  Resp: 16 16  Temp: 97.9 F (36.6 C) 98.3 F (36.8 C)  SpO2: 98% 100%   Vitals:   07/28/20 0345 07/28/20 0800 07/28/20 1122 07/28/20 1513  BP: (!) 101/61 121/80 119/82 (!) 131/88  Pulse:  91 89 97  Resp: '17 14 16 16  ' Temp: 98 F (36.7 C) 98.5 F (36.9 C)  97.9 F (36.6 C) 98.3 F (36.8 C)  TempSrc: Oral Oral Axillary Oral  SpO2: 100% 100% 98% 100%  Weight:      Height:        General: Pt is alert, awake, not in acute distress Cardiovascular: RRR, S1/S2 +, no rubs, no gallops Respiratory: CTA bilaterally, no wheezing, no rhonchi Abdominal: Soft, NT, ND, bowel sounds + Extremities: no edema, no cyanosis    The results of significant diagnostics from this hospitalization (including imaging, microbiology, ancillary and laboratory) are listed below for reference.     Microbiology: Recent Results (from the past 240 hour(s))  SARS Coronavirus 2 by RT PCR (hospital order, performed in [facility] hospital lab) Nasopharyngeal Nasopharyngeal Swab     Status: None   Collection Time: 07/27/20  1:21 PM   Specimen: Nasopharyngeal Swab  Result Value Ref  Range Status   SARS Coronavirus 2 NEGATIVE NEGATIVE Final    Comment: (NOTE) SARS-CoV-2 target nucleic acids are NOT DETECTED.  The SARS-CoV-2 RNA is generally detectable in upper and lower respiratory specimens during the acute phase of infection. The lowest concentration of SARS-CoV-2 viral copies this assay can detect is 250 copies / mL. A negative result does not preclude SARS-CoV-2 infection and should not be used as the sole basis for treatment or other patient management decisions.  A negative result may occur with improper specimen collection / handling, submission of specimen other than nasopharyngeal swab, presence of viral mutation(s) within the areas targeted by this assay, and inadequate number of viral copies (<250 copies / mL). A negative result must be combined with clinical observations, patient history, and epidemiological information.  Fact Sheet for Patients:   StrictlyIdeas.no  Fact Sheet for Healthcare Providers: BankingDealers.co.za  This test is not yet approved or  cleared by the [place] FDA and has been authorized for detection and/or diagnosis of SARS-CoV-2 by FDA under an Emergency Use Authorization (EUA).  This EUA will remain in effect (meaning this test can be used) for the duration of the COVID-19 declaration under Section 564(b)(1) of the Act, 21 U.S.C. section 360bbb-3(b)(1), unless the authorization is terminated or revoked sooner.  Performed at [facility], [place] [address] [place], [place] [address]   Culture, blood (routine x 2)     Status: None (Preliminary result)   Collection Time: 07/27/20  2:35 PM   Specimen: BLOOD  Result Value Ref Range Status   Specimen Description BLOOD RIGHT ANTECUBITAL  Final   Special Requests   Final    BOTTLES DRAWN AEROBIC AND ANAEROBIC Blood Culture adequate volume   Culture   Final    NO GROWTH < 24 HOURS Performed at [facility],  [place] [address], [place], [place] [address]    Report Status PENDING  Incomplete  Culture, blood (routine x 2)     Status: None (Preliminary result)   Collection Time: 07/27/20  2:58 PM   Specimen: BLOOD  Result Value Ref Range Status   Specimen Description BLOOD SITE NOT SPECIFIED  Final   Special Requests   Final    BOTTLES DRAWN AEROBIC AND ANAEROBIC Blood Culture results may not be optimal due to an inadequate volume of blood received in culture bottles   Culture   Final    NO GROWTH < 24 HOURS Performed at [facility], [place] [address] [place], [place] [address]    Report Status PENDING  Incomplete  CSF culture     Status: None (Preliminary result)  Collection Time: 07/27/20  3:45 PM   Specimen: CSF; Cerebrospinal Fluid  Result Value Ref Range Status   Specimen Description CSF  Final   Special Requests NONE  Final   Gram Stain NO WBC SEEN NO ORGANISMS SEEN CYTOSPIN SMEAR   Final   Culture   Final    NO GROWTH < 24 HOURS Performed at [facility], [address] [place], [place] [address]    Report Status PENDING  Incomplete     Labs: BNP (last 3 results) No results for input(s): BNP in the last 8760 hours. Basic Metabolic Panel: Recent Labs  Lab 07/27/20 1153  NA 137  K 3.6  CL 105  CO2 21*  GLUCOSE 109*  BUN 12  CREATININE 0.72  CALCIUM 9.1   Liver Function Tests: Recent Labs  Lab 07/27/20 1153 07/27/20 1624  AST 18  --   ALT 13  --   ALKPHOS 55  --   BILITOT 1.0  --   PROT 7.3  --   ALBUMIN 4.1 4.6   No results for input(s): LIPASE, AMYLASE in the last 168 hours. No results for input(s): AMMONIA in the last 168 hours. CBC: Recent Labs  Lab 07/27/20 1153  WBC 5.5  NEUTROABS 3.9  HGB 14.1  HCT 42.2  MCV 93.0  PLT 192   Cardiac Enzymes: No results for input(s): CKTOTAL, CKMB, CKMBINDEX, TROPONINI in the last 168 hours. BNP: Invalid input(s): POCBNP CBG: No results for input(s): GLUCAP in the last 168 hours. D-Dimer No results  for input(s): DDIMER in the last 72 hours. Hgb A1c Recent Labs    07/28/20 0351  HGBA1C 4.7*   Lipid Profile Recent Labs    07/28/20 0351  CHOL 135  HDL 57  LDLCALC 69  TRIG 46  CHOLHDL 2.4   Thyroid function studies No results for input(s): TSH, T4TOTAL, T3FREE, THYROIDAB in the last 72 hours.  Invalid input(s): FREET3 Anemia work up No results for input(s): VITAMINB12, FOLATE, FERRITIN, TIBC, IRON, RETICCTPCT in the last 72 hours. Urinalysis    Component Value Date/Time   COLORURINE STRAW (A) 07/27/2020 1153   APPEARANCEUR CLEAR 07/27/2020 1153   LABSPEC 1.006 07/27/2020 1153   PHURINE 6.0 07/27/2020 1153   GLUCOSEU NEGATIVE 07/27/2020 1153   HGBUR NEGATIVE 07/27/2020 1153   BILIRUBINUR NEGATIVE 07/27/2020 1153   KETONESUR NEGATIVE 07/27/2020 1153   PROTEINUR NEGATIVE 07/27/2020 1153   UROBILINOGEN 0.2 05/04/2009 1010   NITRITE NEGATIVE 07/27/2020 1153   LEUKOCYTESUR NEGATIVE 07/27/2020 1153   Sepsis Labs Invalid input(s): PROCALCITONIN,  WBC,  LACTICIDVEN Microbiology Recent Results (from the past 240 hour(s))  SARS Coronavirus 2 by RT PCR (hospital order, performed in [facility] hospital lab) Nasopharyngeal Nasopharyngeal Swab     Status: None   Collection Time: 07/27/20  1:21 PM   Specimen: Nasopharyngeal Swab  Result Value Ref Range Status   SARS Coronavirus 2 NEGATIVE NEGATIVE Final    Comment: (NOTE) SARS-CoV-2 target nucleic acids are NOT DETECTED.  The SARS-CoV-2 RNA is generally detectable in upper and lower respiratory specimens during the acute phase of infection. The lowest concentration of SARS-CoV-2 viral copies this assay can detect is 250 copies / mL. A negative result does not preclude SARS-CoV-2 infection and should not be used as the sole basis for treatment or other patient management decisions.  A negative result may occur with improper specimen collection / handling, submission of specimen other than nasopharyngeal swab, presence of  viral mutation(s) within the areas targeted  by this assay, and inadequate number of viral copies (<250 copies / mL). A negative result must be combined with clinical observations, patient history, and epidemiological information.  Fact Sheet for Patients:   StrictlyIdeas.no  Fact Sheet for Healthcare Providers: BankingDealers.co.za  This test is not yet approved or  cleared by the [place] FDA and has been authorized for detection and/or diagnosis of SARS-CoV-2 by FDA under an Emergency Use Authorization (EUA).  This EUA will remain in effect (meaning this test can be used) for the duration of the COVID-19 declaration under Section 564(b)(1) of the Act, 21 U.S.C. section 360bbb-3(b)(1), unless the authorization is terminated or revoked sooner.  Performed at [facility], [place] [address] [place], [place] [address]   Culture, blood (routine x 2)     Status: None (Preliminary result)   Collection Time: 07/27/20  2:35 PM   Specimen: BLOOD  Result Value Ref Range Status   Specimen Description BLOOD RIGHT ANTECUBITAL  Final   Special Requests   Final    BOTTLES DRAWN AEROBIC AND ANAEROBIC Blood Culture adequate volume   Culture   Final    NO GROWTH < 24 HOURS Performed at [facility], [place] [address] [place], [place] [address]    Report Status PENDING  Incomplete  Culture, blood (routine x 2)     Status: None (Preliminary result)   Collection Time: 07/27/20  2:58 PM   Specimen: BLOOD  Result Value Ref Range Status   Specimen Description BLOOD SITE NOT SPECIFIED  Final   Special Requests   Final    BOTTLES DRAWN AEROBIC AND ANAEROBIC Blood Culture results may not be optimal due to an inadequate volume of blood received in culture bottles   Culture   Final    NO GROWTH < 24 HOURS Performed at [facility], [place] [address], [place], [place] [address]    Report Status PENDING  Incomplete  CSF culture     Status:  None (Preliminary result)   Collection Time: 07/27/20  3:45 PM   Specimen: CSF; Cerebrospinal Fluid  Result Value Ref Range Status   Specimen Description CSF  Final   Special Requests NONE  Final   Gram Stain NO WBC SEEN NO ORGANISMS SEEN CYTOSPIN SMEAR   Final   Culture   Final    NO GROWTH < 24 HOURS Performed at [facility], [place] [address], [place],  [address]    Report Status PENDING  Incomplete     Time coordinating discharge: Over 30 minutes  SIGNED:   Little Ishikawa, DO [facility] 07/28/2020, 3:50 PM Pager   If 7PM-7AM, please contact night-coverage [email or web address]

## 2020-07-28 NOTE — Progress Notes (Signed)
nu

## 2020-07-28 NOTE — Plan of Care (Signed)
  Problem: Education: Goal: Knowledge of disease or condition will improve Outcome: Progressing Goal: Knowledge of secondary prevention will improve Outcome: Progressing Goal: Individualized Educational Video(s) Outcome: Progressing   Problem: Coping: Goal: Will verbalize positive feelings about self Outcome: Progressing Goal: Will identify appropriate support needs Outcome: Progressing

## 2020-07-28 NOTE — Progress Notes (Signed)
Echocardiogram 2D Echocardiogram has been performed.  Oneal Deputy Connelly Spruell 07/28/2020, 9:52 AM

## 2020-07-28 NOTE — TOC Transition Note (Signed)
Transition of Care [facility]) - CM/SW Discharge Note   Patient Details  Name: Marissa Burnett MRN: [id number] Date of Birth: 1986/03/13  [facility] CM/SW Contact:  Pollie Friar, RN Phone Number: 07/28/2020, 4:12 PM   Clinical Narrative:    Pt discharging home with self care. No f/u per PT/OT and no DME needs.  [facility] to deliver discharge meds to the room. Pt has transportation home.   Final next level of care: Home/Self Care Barriers to Discharge: No Barriers Identified   Patient Goals and CMS Choice        Discharge Placement                       Discharge Plan and Services                                     Social Determinants of Health (SDOH) Interventions     Readmission Risk Interventions No flowsheet data found.

## 2020-07-28 NOTE — Progress Notes (Addendum)
STROKE TEAM PROGRESS NOTE   INTERVAL HISTORY Husband at bedside.  Patient recounted HPI with me.  Patient has migraine with visual aura in the past however infrequent.  First time happened 5 years ago and then 3 years ago and then 2 weeks ago.  During interval time patient may have some mild headache but no visual aura and very mild.  With visual aura patient was able to close eyes and let visual aura pass in 20 minutes.  However 2 weeks ago, she does have visual aura but also had difficulty with speaking, clumsy of both hands, numbness tingling both hands.  It was happened while he was at work, he was sent to PCP for follow-up in 2 weeks.  MRI was done 2 days ago showed 2-3 punctate ischemic focus at left MCA territory.  Patient was sent to ER for evaluation.  Vitals:   07/28/20 0153 07/28/20 0344 07/28/20 0345 07/28/20 0800  BP: (!) 87/60  (!) 101/61 121/80  Pulse: 81 87  91  Resp: 15  17 14   Temp: 98.3 F (36.8 C) 98 F (36.7 C) 98 F (36.7 C) 98.5 F (36.9 C)  TempSrc: Oral Oral Oral Oral  SpO2: 100%  100% 100%  Weight:      Height:       CBC:  Recent Labs  Lab 07/27/20 1153  WBC 5.5  NEUTROABS 3.9  HGB 14.1  HCT 42.2  MCV 93.0  PLT 354   Basic Metabolic Panel:  Recent Labs  Lab 07/27/20 1153  NA 137  K 3.6  CL 105  CO2 21*  GLUCOSE 109*  BUN 12  CREATININE 0.72  CALCIUM 9.1   Lipid Panel:  Recent Labs  Lab 07/28/20 0351  CHOL 135  TRIG 46  HDL 57  CHOLHDL 2.4  VLDL 9  LDLCALC 69   HgbA1c:  Recent Labs  Lab 07/28/20 0351  HGBA1C 4.7*   Urine Drug Screen:  Recent Labs  Lab 07/27/20 1153  LABOPIA NONE DETECTED  COCAINSCRNUR NONE DETECTED  LABBENZ POSITIVE*  AMPHETMU NONE DETECTED  THCU NONE DETECTED  LABBARB NONE DETECTED    Alcohol Level  Recent Labs  Lab 07/27/20 1153  ETH <10    IMAGING past 24 hours CT ANGIO HEAD W OR WO CONTRAST  Result Date: 07/27/2020 CLINICAL DATA:  34 year old female with headache, speech disturbance.  Abnormal brain MRI earlier today. Status post fluoroscopic guided lumbar puncture earlier today. EXAM: CT ANGIOGRAPHY HEAD AND NECK TECHNIQUE: Multidetector CT imaging of the head and neck was performed using the standard protocol during bolus administration of intravenous contrast. Multiplanar CT image reconstructions and MIPs were obtained to evaluate the vascular anatomy. Carotid stenosis measurements (when applicable) are obtained utilizing NASCET criteria, using the distal internal carotid diameter as the denominator. CONTRAST:  170mL OMNIPAQUE IOHEXOL 350 MG/ML SOLN COMPARISON:  None. FINDINGS: CT HEAD Brain: Normal cerebral volume. No midline shift, ventriculomegaly, mass effect, evidence of mass lesion, intracranial hemorrhage or evidence of cortically based acute infarction. Gray-white matter differentiation is within normal limits throughout the brain. Calvarium and skull base: Negative. Paranasal sinuses: Visualized paranasal sinuses and mastoids are stable and well pneumatized. Orbits: Visualized orbits and scalp soft tissues are within normal limits. Suboptimal arterial contrast timing on the CTA portion, with somewhat venous dominant timing in both the head and the neck. CTA NECK Skeleton: Negative. Upper chest: Negative. Other neck: Negative. Aortic arch: 4 vessel arch configuration, the left vertebral arises directly from the arch. No arch atherosclerosis.  Right carotid system: Negative aside from right ICA tortuosity just below the skull base. Left carotid system: Negative aside from left ICA tortuosity just below the skull base. Vertebral arteries: Normal proximal right subclavian artery, cervical right vertebral artery. The left vertebral arises directly from the arch and is non dominant, but otherwise norm to the skull base. CTA HEAD Posterior circulation: Dominant right vertebral V4 segment. Both PICA origins appear patent. No distal vertebral stenosis. Patent vertebrobasilar junction and  basilar artery without stenosis. SCA and PCA origins are within normal limits. Both posterior communicating arteries are present, the right is diminutive. Bilateral PCA branches are within normal limits. Anterior circulation: Both ICA siphons are patent and appear normal. Normal posterior communicating artery origins. Mildly tortuous left ophthalmic artery origin suspected (normal variant). Patent carotid termini. Normal MCA and ACA origins. Anterior communicating artery and bilateral ACA branches are within normal limits. Bilateral MCA M1 segments, MCA bifurcations, and MCA branches are within normal limits. Venous sinuses: Patent, venous dominant intracranial contrast timing. The right transverse and sigmoid sinuses appear dominant. Anatomic variants: Left vertebral artery arises directly from the arch and is non dominant. Review of the MIP images confirms the above findings IMPRESSION: 1. Suboptimal contrast timing for CTA head and neck but no vascular abnormality identified; both ICAs are tortuous at the skull base. 2.  Normal CT appearance of the brain. Electronically Signed   By: Genevie Ann M.D.   On: 07/27/2020 19:56   CT ANGIO NECK W OR WO CONTRAST  Result Date: 07/27/2020 CLINICAL DATA:  34 year old female with headache, speech disturbance. Abnormal brain MRI earlier today. Status post fluoroscopic guided lumbar puncture earlier today. EXAM: CT ANGIOGRAPHY HEAD AND NECK TECHNIQUE: Multidetector CT imaging of the head and neck was performed using the standard protocol during bolus administration of intravenous contrast. Multiplanar CT image reconstructions and MIPs were obtained to evaluate the vascular anatomy. Carotid stenosis measurements (when applicable) are obtained utilizing NASCET criteria, using the distal internal carotid diameter as the denominator. CONTRAST:  135mL OMNIPAQUE IOHEXOL 350 MG/ML SOLN COMPARISON:  None. FINDINGS: CT HEAD Brain: Normal cerebral volume. No midline shift,  ventriculomegaly, mass effect, evidence of mass lesion, intracranial hemorrhage or evidence of cortically based acute infarction. Gray-white matter differentiation is within normal limits throughout the brain. Calvarium and skull base: Negative. Paranasal sinuses: Visualized paranasal sinuses and mastoids are stable and well pneumatized. Orbits: Visualized orbits and scalp soft tissues are within normal limits. Suboptimal arterial contrast timing on the CTA portion, with somewhat venous dominant timing in both the head and the neck. CTA NECK Skeleton: Negative. Upper chest: Negative. Other neck: Negative. Aortic arch: 4 vessel arch configuration, the left vertebral arises directly from the arch. No arch atherosclerosis. Right carotid system: Negative aside from right ICA tortuosity just below the skull base. Left carotid system: Negative aside from left ICA tortuosity just below the skull base. Vertebral arteries: Normal proximal right subclavian artery, cervical right vertebral artery. The left vertebral arises directly from the arch and is non dominant, but otherwise norm to the skull base. CTA HEAD Posterior circulation: Dominant right vertebral V4 segment. Both PICA origins appear patent. No distal vertebral stenosis. Patent vertebrobasilar junction and basilar artery without stenosis. SCA and PCA origins are within normal limits. Both posterior communicating arteries are present, the right is diminutive. Bilateral PCA branches are within normal limits. Anterior circulation: Both ICA siphons are patent and appear normal. Normal posterior communicating artery origins. Mildly tortuous left ophthalmic artery origin suspected (normal variant).  Patent carotid termini. Normal MCA and ACA origins. Anterior communicating artery and bilateral ACA branches are within normal limits. Bilateral MCA M1 segments, MCA bifurcations, and MCA branches are within normal limits. Venous sinuses: Patent, venous dominant intracranial  contrast timing. The right transverse and sigmoid sinuses appear dominant. Anatomic variants: Left vertebral artery arises directly from the arch and is non dominant. Review of the MIP images confirms the above findings IMPRESSION: 1. Suboptimal contrast timing for CTA head and neck but no vascular abnormality identified; both ICAs are tortuous at the skull base. 2.  Normal CT appearance of the brain. Electronically Signed   By: Genevie Ann M.D.   On: 07/27/2020 19:56   DG FL GUIDED LUMBAR PUNCTURE  Result Date: 07/27/2020 CLINICAL DATA:  Headaches, speech impairment, and weakness for 2 weeks. Abnormal brain MRI. EXAM: DIAGNOSTIC LUMBAR PUNCTURE UNDER FLUOROSCOPIC GUIDANCE FLUOROSCOPY TIME:  Fluoroscopy Time:  0 minutes 36 seconds Radiation Exposure Index (if provided by the fluoroscopic device): 4.8 mGy Number of Acquired Spot Images: 0 PROCEDURE: Informed consent was obtained from the patient prior to the procedure, including potential complications of headache, allergy, and pain. With the patient prone, the lower back was prepped with Betadine. 1% Lidocaine was used for local anesthesia. Lumbar puncture was performed at the L3-4 level using a 20 gauge needle with return of clear CSF with an opening pressure of 14 cm water. 14 ml of CSF were obtained for laboratory studies. The patient tolerated the procedure well and there were no apparent complications. IMPRESSION: Successful diagnostic lumbar puncture under fluoroscopic guidance. No immediate complications. Electronically Signed   By: Marlaine Hind M.D.   On: 07/27/2020 16:35    PHYSICAL EXAM  Temp:  [97.9 F (36.6 C)-98.7 F (37.1 C)] 98.3 F (36.8 C) (07/30 1513) Pulse Rate:  [81-97] 97 (07/30 1513) Resp:  [14-20] 16 (07/30 1513) BP: (87-131)/(60-88) 131/88 (07/30 1513) SpO2:  [97 %-100 %] 100 % (07/30 1513) Weight:  [75.8 kg] 75.8 kg (07/29 2222)  General - Well nourished, well developed, in no apparent distress.  Ophthalmologic - fundi not  visualized due to noncooperation.  Cardiovascular - Regular rhythm and rate.  Mental Status -  Level of arousal and orientation to time, place, and person were intact. Language including expression, naming, repetition, comprehension was assessed and found intact. Attention span and concentration were normal. Recent and remote memory were intact. Fund of Knowledge was assessed and was intact.  Cranial Nerves II - XII - II - Visual field intact OU. III, IV, VI - Extraocular movements intact. V - Facial sensation intact bilaterally VII - Facial movement intact bilaterally. VIII - Hearing & vestibular intact bilaterally. X - Palate elevates symmetrically. XI - Chin turning & shoulder shrug intact bilaterally. XII - Tongue protrusion intact.  Motor Strength - The patient's strength was normal in all extremities and pronator drift was absent.  Bulk was normal and fasciculations were absent.   Motor Tone - Muscle tone was assessed at the neck and appendages and was normal.  Reflexes - The patient's reflexes were symmetrical in all extremities and she had no pathological reflexes.  Sensory - Light touch, temperature/pinprick were assessed and were symmetrical.    Coordination - The patient had normal movements in the hands and feet with no ataxia or dysmetria.  Tremor was absent.  Gait and Station - deferred.   ASSESSMENT/PLAN Ms. Marissa Burnett is a 34 y.o. female with history of migraines presenting following an abnormal OP MRI. Hx transient  hand numbness (laterality unclear) and word finding difficulties, unable to text. Symptoms different than her typical migraines.   Stroke:  L MCA territory to 3 punctate infarcts not relating to presenting symptoms in setting of Migraine with aura and newly identified PFO  MRI w/wo  2-3 small L posterior frontal cortex diffusion abnormalities, ? Restricted diffusion. 2 linear posterior L temporal lobe and L parietal lobe enhancements felt to be  subacute infarcts (not mets d/t linear nature)  LP normal - no vasculitis picture  CT brain normal  CTA head & neck suboptimal. No vascular abnormalities.   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 Burt Knack to consider closure   Continue DAPT  Migraine with aura  Infrequent occurrence   Increased stroke risk   Recommend tylenol or ibuprofen at early sign of migraine  No more imitrex  Other Stroke Risk Factors  ETOH use, alcohol level <10, advised to drink no more than 1 drink(s) a day  Overweight, Body mass index is 28.68 kg/m., recommend weight loss, diet and exercise as appropriate   Other Active Problems  Anxiety on xanax prn  GERD on PPI  Hospital day # 1  Neurology will sign off. Please call with questions. Pt will follow up with stroke clinic Dr. Leonie Man at [facility] in about 4 weeks. Thanks for the consult.  Rosalin Hawking, MD PhD Stroke Neurology 07/28/2020 8:45 PM  To contact Stroke Continuity provider, please refer to [email or web address] After hours, contact General Neurology

## 2020-07-28 NOTE — Evaluation (Signed)
Occupational Therapy Evaluation Patient Details Name: Marissa Burnett MRN: [id number] DOB: [birth year] Today's Date: 07/28/2020    History of Present Illness Patient is a 34 y/o female who presents with an abnormal MRI from her PCP. Pt with 2 episodes of lightheadedness, speech difficulties, blurred vision and weakness which resolved. Brain MRI- 2 areas in left posterior frontal cortex concerning for infarct and 2 other areas in posterior left temporal lobe and parietal lobe consistent with subacute infarct. PMH includes migraines, anxiety.   Clinical Impression   PTA pt reports being independent and driving. Pt was admitted for above and treated for problem list below (see OT Problem List). Upon arrival pt up at sink completing grooming. Pt is independent with ADLs, transfer, and mobility. Pt walked in hallway with dynamic balance activities and did not express new or worsening headache. Pt reported she had a lot of tests and worse headache that increased dizziness with PT. Pt encouraged to left RN or MD know if onset of symptoms occur again. Believe pt would benefit from initial 24/7 assist/supervision upon dc for safety. No further OT needs identified - OT to sign off. Thank you for referral.    Follow Up Recommendations  No OT follow up;Supervision/Assistance - 24 hour (initial)    Equipment Recommendations  None recommended by OT       Precautions / Restrictions Precautions Precautions: None Restrictions Weight Bearing Restrictions: No      Mobility Bed Mobility Overal bed mobility: Independent                Transfers Overall transfer level: Independent Equipment used: None             General transfer comment: Stood from EOB without difficulty, from toilet without difficulty.    Balance Overall balance assessment: Needs assistance   Sitting balance-Leahy Scale: Normal     Standing balance support: During functional activity Standing balance-Leahy Scale:  Normal               High level balance activites: Direction changes;Turns;Head turns High Level Balance Comments: tolerated above with no dizziness. Reported this morning she felt dizzy due to headaches and testing and feels better now           ADL either performed or assessed with clinical judgement   ADL Overall ADL's : Independent                                       General ADL Comments: pt ambulating around room independently prior to eval and able to complete all ADLs - pt reports feeling at baseline     Vision Baseline Vision/History: Wears glasses Wears Glasses: At all times Patient Visual Report: No change from baseline Vision Assessment?: Yes Eye Alignment: Within Functional Limits Ocular Range of Motion: Within Functional Limits Alignment/Gaze Preference: Within Defined Limits Tracking/Visual Pursuits: Able to track stimulus in all quads without difficulty Saccades: Within functional limits Convergence: Within functional limits Visual Fields: No apparent deficits            Pertinent Vitals/Pain Pain Assessment: Faces Pain Score: 5  Faces Pain Scale: Hurts a little bit Pain Location: headache Pain Descriptors / Indicators: Headache Pain Intervention(s): Monitored during session     Hand Dominance Right   Extremity/Trunk Assessment Upper Extremity Assessment Upper Extremity Assessment: Overall WFL for tasks assessed   Lower Extremity Assessment Lower Extremity Assessment: Defer to PT  evaluation LLE Deficits / Details: tingling left foot on dorsum in 1 spot, showed up during ambulation but not present before, transient in nature. LLE Sensation: decreased light touch   Cervical / Trunk Assessment Cervical / Trunk Assessment: Normal   Communication Communication Communication: No difficulties   Cognition Arousal/Alertness: Awake/alert Behavior During Therapy: WFL for tasks assessed/performed Overall Cognitive Status: Within  Functional Limits for tasks assessed                                 General Comments: Pt A&Ox4 with good recall of situation and significant events   General Comments  VSS - significant other present for eval            Home Living Family/patient expects to be discharged to:: Private residence Living Arrangements: Spouse/significant other;Children (14 and 65 - older daughter leaving for college) Available Help at Discharge: Family;Available PRN/intermittently Type of Home: House Home Access: Stairs to enter [employer] of Steps: 2 Entrance Stairs-Rails: None Home Layout: One level     Bathroom Shower/Tub: Occupational psychologist: Standard     Home Equipment: Civil engineer, contracting - built in;None          Prior Functioning/Environment Level of Independence: Independent                 OT Problem List: Pain         OT Goals(Current goals can be found in the care plan section) Acute Rehab OT Goals Patient Stated Goal: to find out what is wrong with me OT Goal Formulation: With patient Time For Goal Achievement: 08/11/20 Potential to Achieve Goals: Good   AM-PAC OT "6 Clicks" Daily Activity     Outcome Measure Help from another person eating meals?: None Help from another person taking care of personal grooming?: None Help from another person toileting, which includes using toliet, bedpan, or urinal?: None Help from another person bathing (including washing, rinsing, drying)?: None Help from another person to put on and taking off regular upper body clothing?: None Help from another person to put on and taking off regular lower body clothing?: None 6 Click Score: 24   End of Session Nurse Communication: Mobility status  Activity Tolerance: Patient tolerated treatment well Patient left: in bed;with call bell/phone within reach;with family/visitor present  OT Visit Diagnosis: Other symptoms and signs involving the nervous system  (R29.898);Pain Pain - part of body:  (headache)                Time: 3710-6269 OT Time Calculation (min): 10 min Charges:  OT General Charges $OT Visit: 1 Visit OT Evaluation $OT Eval Low Complexity: 1 Low  Eshal Propps/OTS  Jadesola Poynter 07/28/2020, 12:39 PM

## 2020-07-28 NOTE — Progress Notes (Signed)
Pt discharged home at this time.  Verbalizes understanding of all medications, and followup appointments.  She has all belongings with her including cell phone and charger.  No complaints.

## 2020-07-28 NOTE — Progress Notes (Signed)
Pt c/o frontal headache.  VSS.  States she will have headache if she doesn't sleep well.  Offered tylenol and sumatriptan but refused, requesting ibuprofen.  MD aware.

## 2020-07-28 NOTE — Evaluation (Signed)
Physical Therapy Evaluation Patient Details Name: Marissa Burnett MRN: [id number] DOB: 1986/04/25 Today's Date: 07/28/2020   History of Present Illness  Patient is a 34 y/o female who presents with an abnormal MRI from her PCP. Pt with 2 episodes of lightheadedness, speech difficulties, blurred vision and weakness which resolved. Brain MRI- 2 areas in left posterior frontal cortex concerning for infarct and 2 other areas in posterior left temporal lobe and parietal lobe consistent with subacute infarct. PMH includes migraines, anxiety.  Clinical Impression  Patient presents with headache, dizziness and left foot tingling on dorsum s/p above. Pt independent and works as Statistician PTA. Lives with spouse and 2 children. Today, pt tolerated bed mobility, transfers and ambulation with Mod I-independent. Noted to have worsening headache and dizziness with balance challenges especially head turns, and changes in direction. Also with onset of left foot tingling which was not present at beginning of session; appears to be transient. Pt anxiously awaiting results and to find out what is going on. At this time, pt does not require skilled therapy services as pt functioning at Mod I level. Not a fall risk at this time. Discharge from therapy.    Follow Up Recommendations No PT follow up    Equipment Recommendations  None recommended by PT    Recommendations for Other Services       Precautions / Restrictions Precautions Precautions: None Restrictions Weight Bearing Restrictions: No      Mobility  Bed Mobility Overal bed mobility: Independent                Transfers Overall transfer level: Modified independent Equipment used: None             General transfer comment: Stood from EOB without difficulty, from toilet without difficulty.  Ambulation/Gait Ambulation/Gait assistance: Independent Gait Distance (Feet): 400 Feet Assistive device: None Gait  Pattern/deviations: WFL(Within Functional Limits) Gait velocity: good speed Gait velocity interpretation: >2.62 ft/sec, indicative of community ambulatory General Gait Details: Steady gait with higher level balance activities- limited due to worsening HA with head turns, dizziness and onset of left foot tingling.  Stairs            Wheelchair Mobility    Modified Rankin (Stroke Patients Only) Modified Rankin (Stroke Patients Only) Pre-Morbid Rankin Score: No symptoms Modified Rankin: No significant disability     Balance Overall balance assessment: Needs assistance   Sitting balance-Leahy Scale: Normal     Standing balance support: During functional activity Standing balance-Leahy Scale: Good               High level balance activites: Direction changes;Backward walking;Turns;Sudden stops;Head turns High Level Balance Comments: Tolerated above with dizziness with head turns, onset of left foot tingling and worsening headache so stopped higher level balance activities.             Pertinent Vitals/Pain Pain Assessment: 0-10 Pain Score: 5  Pain Location: headache Pain Descriptors / Indicators: Headache Pain Intervention(s): Monitored during session;Repositioned;Limited activity within patient's tolerance    Home Living Family/patient expects to be discharged to:: Private residence Living Arrangements: Spouse/significant other;Children (71 and 76 y/o) Available Help at Discharge: Family;Available PRN/intermittently Type of Home: House Home Access: Stairs to enter Entrance Stairs-Rails: None Entrance Stairs-Number of Steps: 2 Home Layout: One level Home Equipment: Shower seat - built in;None      Prior Function Level of Independence: Independent               Hand Dominance  Dominant Hand: Right    Extremity/Trunk Assessment   Upper Extremity Assessment Upper Extremity Assessment: Defer to OT evaluation    Lower Extremity  Assessment Lower Extremity Assessment: LLE deficits/detail LLE Deficits / Details: tingling left foot on dorsum in 1 spot, showed up during ambulation but not present before, transient in nature. LLE Sensation: decreased light touch    Cervical / Trunk Assessment Cervical / Trunk Assessment: Normal  Communication   Communication: No difficulties  Cognition Arousal/Alertness: Awake/alert Behavior During Therapy: WFL for tasks assessed/performed Overall Cognitive Status: Within Functional Limits for tasks assessed                                        General Comments General comments (skin integrity, edema, etc.): HR up to 130 bpm with activity, pt reports feeling anxious. BP stable ~118/62.    Exercises     Assessment/Plan    PT Assessment Patent does not need any further PT services  PT Problem List         PT Treatment Interventions      PT Goals (Current goals can be found in the Care Plan section)  Acute Rehab PT Goals Patient Stated Goal: to find out what is wrong with me PT Goal Formulation: All assessment and education complete, DC therapy    Frequency     Barriers to discharge        Co-evaluation               AM-PAC PT "6 Clicks" Mobility  Outcome Measure Help needed turning from your back to your side while in a flat bed without using bedrails?: None Help needed moving from lying on your back to sitting on the side of a flat bed without using bedrails?: None Help needed moving to and from a bed to a chair (including a wheelchair)?: None Help needed standing up from a chair using your arms (e.g., wheelchair or bedside chair)?: None Help needed to walk in hospital room?: None Help needed climbing 3-5 steps with a railing? : None 6 Click Score: 24    End of Session   Activity Tolerance: Patient tolerated treatment well Patient left: in bed;with call bell/phone within reach;with family/visitor present Nurse Communication:  Mobility status PT Visit Diagnosis: Pain Pain - part of body:  (head)    Time: 1761-6073 PT Time Calculation (min) (ACUTE ONLY): 28 min   Charges:   PT Evaluation $PT Eval Moderate Complexity: 1 Mod PT Treatments $Neuromuscular Re-education: 8-22 mins        Marisa Severin, PT, DPT Acute Rehabilitation Services Pager [phone] Office [phone]      Marguarite Arbour A Braman 07/28/2020, 9:07 AM

## 2020-07-28 NOTE — H&P (View-Only) (Signed)
nu

## 2020-07-28 NOTE — Progress Notes (Signed)
BP 87/62 asymptomatic. Dr. Cyd Silence informed.

## 2020-07-29 ENCOUNTER — Encounter: Payer: Self-pay | Admitting: Family Medicine

## 2020-07-29 LAB — HOMOCYSTEINE: Homocysteine: 6.8 umol/L (ref 0.0–14.5)

## 2020-07-29 LAB — LUPUS ANTICOAGULANT PANEL
DRVVT: 30.8 s (ref 0.0–47.0)
PTT Lupus Anticoagulant: 32.8 s (ref 0.0–51.9)

## 2020-07-29 LAB — EXTRACTABLE NUCLEAR ANTIGEN ANTIBODY
ENA SM Ab Ser-aCnc: <0.2 AI (ref 0.0–0.9)
Ribonucleic Protein: <0.2 AI (ref 0.0–0.9)
SSA (Ro) (ENA) Antibody, IgG: <0.2 AI (ref 0.0–0.9)
SSB (La) (ENA) Antibody, IgG: <0.2 AI (ref 0.0–0.9)
Scleroderma (Scl-70) (ENA) Antibody, IgG: <0.2 AI (ref 0.0–0.9)
ds DNA Ab: 2 IU/mL (ref 0–9)

## 2020-07-29 LAB — PROTEIN S ACTIVITY: Protein S Activity: 85 % (ref 63–140)

## 2020-07-29 LAB — ANTI-JO 1 ANTIBODY, IGG: Anti JO-1: <0.2 AI (ref 0.0–0.9)

## 2020-07-29 LAB — PROTEIN S, TOTAL: Protein S Ag, Total: 70 % (ref 60–150)

## 2020-07-29 LAB — PROTEIN C ACTIVITY: Protein C Activity: 105 % (ref 73–180)

## 2020-07-30 ENCOUNTER — Other Ambulatory Visit: Payer: Self-pay | Admitting: Nurse Practitioner

## 2020-07-30 ENCOUNTER — Telehealth: Payer: Self-pay | Admitting: Diagnostic Neuroimaging

## 2020-07-30 DIAGNOSIS — G971 Other reaction to spinal and lumbar puncture: Secondary | ICD-10-CM

## 2020-07-30 LAB — CSF CULTURE W GRAM STAIN
Culture: NO GROWTH
Gram Stain: NONE SEEN

## 2020-07-30 LAB — CARDIOLIPIN ANTIBODIES, IGG, IGM, IGA
Anticardiolipin IgA: <9 APL U/mL (ref 0–11)
Anticardiolipin IgG: <9 GPL U/mL (ref 0–14)
Anticardiolipin IgM: <9 MPL U/mL (ref 0–12)

## 2020-07-30 LAB — BETA-2-GLYCOPROTEIN I ABS, IGG/M/A
Beta-2 Glyco I IgG: <9 GPI IgG units (ref 0–20)
Beta-2-Glycoprotein I IgA: <9 GPI IgA units (ref 0–25)
Beta-2-Glycoprotein I IgM: <9 GPI IgM units (ref 0–32)

## 2020-07-30 LAB — MPO/PR-3 (ANCA) ANTIBODIES
ANCA Proteinase 3: <3.5 U/mL (ref 0.0–3.5)
Myeloperoxidase Abs: <9 U/mL (ref 0.0–9.0)

## 2020-07-30 NOTE — Telephone Encounter (Signed)
Patient called in for post-LP headaches. Worse with standing, and slightly better laying down. Advised to stay hydrated, use tylenol and caffeine. Symptoms usually resolve within 5-7 days.   Also need follow up appt with Dr. Leonie Man (only). See hospital discharge notes.    Penni Bombard, MD 03/06/3778, 39:68 AM Certified in Neurology, Neurophysiology and Neuroimaging  [facility] [address], [place], [place] [address] [phone]

## 2020-07-31 ENCOUNTER — Telehealth: Payer: Self-pay

## 2020-07-31 ENCOUNTER — Other Ambulatory Visit: Payer: Self-pay | Admitting: *Deleted

## 2020-07-31 ENCOUNTER — Telehealth: Payer: Self-pay | Admitting: Physician Assistant

## 2020-07-31 ENCOUNTER — Encounter: Payer: Self-pay | Admitting: *Deleted

## 2020-07-31 LAB — ANCA TITERS
Atypical P-ANCA titer: <1:20 {titer}
C-ANCA: <1:20 {titer}
P-ANCA: <1:20 {titer}

## 2020-07-31 NOTE — Patient Outreach (Signed)
Marissa Burnett  07/31/2020  Marissa Burnett 08/14/1986 [id number]   Transition of care call/case closure   Referral received:07/28/20 Initial outreach:07/31/20 Insurance: [employer]   Subjective: Initial successful telephone call to patient's preferred number in order to complete transition of care assessment; 2 HIPAA identifiers verified. Explained purpose of call and completed transition of care assessment.  Marissa Burnett states that she is feeling better on today. She discussed feeling tired having headache since Lumbar puncture, she has made contact with neurology on yesterday to discuss.  Reviewed signs and symptoms of stroke and action plan she denies any new symptoms and verbalizes understanding of follow up. She reports  tolerating diet without nausea on today. She  denies bowel or bladder problems.  Spouse/children are assisting with her recovery. She discussed plans for TEE on 8/6  to follow up on PFO.  She denies any ongoing health issues and says she does not need a referral to one of the [place] chronic disease Burnett programs.  She does not have the hospital indemnity She uses a [facility] at [facility]  Objective:  Marissa Burnett  was hospitalized at [facility] 7/29-7/30/21 for CVA, Migraine Comorbidities include: Anxiety, GERD, Migraine. She  was discharged to home on 07/28/20 without the need for home health services or DME.   Assessment:  Patient voices good understanding of all discharge instructions.  See transition of care flowsheet for assessment details.   Plan:  Reviewed hospital discharge diagnosis of CVA, Migraine    and discharge treatment plan using hospital discharge instructions, assessing medication adherence, reviewing problems requiring provider notification, and discussing the importance of follow up with surgeon, primary care provider and/or specialists as directed.  Reviewed [place] healthy  lifestyle program information to receive discounted premium for  2022   Step 1: Get  your annual physical  Step 2: Complete your health assessment  Step 3:Identify your current health status and complete the corresponding action step between January 1, and August 30, 2020.        No ongoing care Burnett needs identified so will close case to [place] Burnett services and route successful outreach letter with [place] Burnett pamphlet and 24 Hour Nurse Line Magnet to [place] Burnett clinical pool to be mailed to patient's home address.  Thanked patient for their services to [facility].  Joylene Draft, RN, BSN  [place] Burn[facility] Coordinator  [phone]- Mobile [phone]- Toll Free Main Office

## 2020-07-31 NOTE — Telephone Encounter (Signed)
Called patient who is feeling much better today. Scheduled her hospital FU with Dr Leonie Man, advised she arrive 30 minutes early, bring insurance cards, med list. Patient verbalized understanding, appreciation.

## 2020-07-31 NOTE — Telephone Encounter (Signed)
Called and scheduled patient for PFO consult 8/23. She is scheduled for TEE this Friday. She requests Dr. Burt Knack to review TEE and to be called if she should keep or cancel consult based on results.

## 2020-07-31 NOTE — Telephone Encounter (Signed)
Received request for TEE to evaluate recent possible TIA vs migraine with aura, transcranial doppler showed possible PFO.   Risk and benefit of TEE explained to Mrs. Rademaker, 1:10000 case of hypotension, bradycardia, aspiration event, esophageal trauma including bleeding, hematoma or rupture. She is willing to proceed.   Procedure scheduled with Dr. Aundra Dubin at 8:00AM this Friday. Patient will arrive by [place] test tomorrow.

## 2020-08-01 ENCOUNTER — Other Ambulatory Visit: Payer: Self-pay | Admitting: *Deleted

## 2020-08-01 ENCOUNTER — Other Ambulatory Visit (HOSPITAL_COMMUNITY)
Admission: RE | Admit: 2020-08-01 | Discharge: 2020-08-01 | Disposition: A | Discharge status: Home / Self Care | Payer: No Typology Code available for payment source | Source: Ambulatory Visit | Attending: Cardiology | Admitting: Cardiology

## 2020-08-01 ENCOUNTER — Emergency Department (HOSPITAL_COMMUNITY)
Admission: EM | Admit: 2020-08-01 | Discharge: 2020-08-01 | Discharge status: Against Medical Advice | Payer: No Typology Code available for payment source

## 2020-08-01 ENCOUNTER — Other Ambulatory Visit: Payer: Self-pay

## 2020-08-01 ENCOUNTER — Encounter: Payer: Self-pay | Admitting: Diagnostic Neuroimaging

## 2020-08-01 DIAGNOSIS — G971 Other reaction to spinal and lumbar puncture: Secondary | ICD-10-CM

## 2020-08-01 DIAGNOSIS — Z20822 Contact with and (suspected) exposure to covid-19: Secondary | ICD-10-CM | POA: Insufficient documentation

## 2020-08-01 DIAGNOSIS — Z01812 Encounter for preprocedural laboratory examination: Secondary | ICD-10-CM | POA: Diagnosis present

## 2020-08-01 LAB — CULTURE, BLOOD (ROUTINE X 2)
Culture: NO GROWTH
Culture: NO GROWTH
Special Requests: ADEQUATE

## 2020-08-01 LAB — ANAEROBIC CULTURE

## 2020-08-01 LAB — SARS CORONAVIRUS 2 (TAT 6-24 HRS): SARS Coronavirus 2: NEGATIVE

## 2020-08-01 LAB — OLIGOCLONAL BANDS, CSF + SERM

## 2020-08-01 LAB — FACTOR 5 LEIDEN

## 2020-08-01 NOTE — Telephone Encounter (Signed)
Called patient who is in [facility] ED exam room waiting to hear if she can get blood patch tonight. I advised her of order placed for blood patch tomorrow. Per Hinton Dyer, referral coordinator  [facility] doesn't do blood patch. I transferred call to Hinton Dyer to assist patient.

## 2020-08-01 NOTE — Telephone Encounter (Signed)
Pt has called to report that she is feeling better from Sunday but still feels she is recovering as far as having to lay back down after being up for 20 mins. Or so.  Pt is asking for a call as to what else could be suggested for her

## 2020-08-01 NOTE — Addendum Note (Signed)
Addended by: Andrey Spearman R on: 08/01/2020 04:40 PM   Modules accepted: Orders

## 2020-08-01 NOTE — Telephone Encounter (Signed)
Pt on her way to the ER. Pt said no one can do blood patch after 5 pm in the ER. Pt ask if possible way to setup for tomorrow setup as a outpatient. Pt would like a call back.

## 2020-08-01 NOTE — Telephone Encounter (Signed)
Called patient who stated she has gotten better, but after 20 minutes of being up the headache comes back. She lays down for relief. She is taking Tylenol twice daily, stated she doesn't feel like it helps . That's why she isn't taking it more often. She was told not to take Advil, is on ASA and Plavix. I advised she continue to rest and hydrate. She wants to know what else she can do. She also stated her TEE is on Friday, and if she is still tired and having headaches should she reschedule it. She asked that MD advise her. I stated will let Dr Leta Baptist know and call her back. Patient verbalized understanding, appreciation.

## 2020-08-01 NOTE — Telephone Encounter (Signed)
  Blood patch requested as outpatient.  Order placed for Dr. Leonie Man.  Orders Placed This Encounter  Procedures  . DG Epidurography     Penni Bombard, MD 09/05/8477, 4:12 PM Certified in Neurology, Neurophysiology and Neuroimaging  [facility] [address] [place], [place] [address] [phone]

## 2020-08-01 NOTE — Telephone Encounter (Signed)
Requesting Dr. Leonie Man / hospital stroke team to reach out to patient. -VRP

## 2020-08-01 NOTE — Telephone Encounter (Signed)
I spoke to patient. She had a spinal tap done last Thursday at the hospital during stroke work up and is having a severe spinal headache. I advised her to go to ErRfor epidural blood patch and drink lots of fluid and caffeine. She voiced understanding

## 2020-08-01 NOTE — Telephone Encounter (Signed)
Patient called and stated she had not gotten a call back yet. I advised her Dr Leta Baptist messaged stroke team to call her. She expressed concern that she may need something done since she still is having headaches, and is uncertain about TEE on Friday.  I messaged Dr Leta Baptist who got in touch with Dr Leonie Man. Dr Leonie Man called patient at 3:18 pm.

## 2020-08-02 ENCOUNTER — Ambulatory Visit (HOSPITAL_COMMUNITY)
Admission: RE | Admit: 2020-08-02 | Discharge: 2020-08-02 | Disposition: A | Discharge status: Home / Self Care | Payer: No Typology Code available for payment source | Source: Ambulatory Visit | Attending: Diagnostic Neuroimaging | Admitting: Diagnostic Neuroimaging

## 2020-08-02 ENCOUNTER — Ambulatory Visit
Admission: RE | Admit: 2020-08-02 | Discharge: 2020-08-02 | Disposition: A | Discharge status: Home / Self Care | Payer: No Typology Code available for payment source | Source: Ambulatory Visit | Attending: Diagnostic Neuroimaging | Admitting: Diagnostic Neuroimaging

## 2020-08-02 ENCOUNTER — Encounter: Payer: Self-pay | Admitting: *Deleted

## 2020-08-02 ENCOUNTER — Telehealth: Payer: Self-pay | Admitting: Family Medicine

## 2020-08-02 ENCOUNTER — Telehealth: Payer: Self-pay

## 2020-08-02 ENCOUNTER — Telehealth: Payer: Self-pay | Admitting: Diagnostic Neuroimaging

## 2020-08-02 ENCOUNTER — Telehealth: Payer: Self-pay | Admitting: *Deleted

## 2020-08-02 DIAGNOSIS — G971 Other reaction to spinal and lumbar puncture: Secondary | ICD-10-CM | POA: Diagnosis present

## 2020-08-02 LAB — PROTHROMBIN GENE MUTATION

## 2020-08-02 MED ORDER — BUTALBITAL-APAP-CAFFEINE 50-325-40 MG PO TABS
1.0000 | ORAL_TABLET | Freq: Three times a day (TID) | ORAL | 0 refills | Status: DC | PRN
Start: 2020-08-02 — End: 2020-08-25

## 2020-08-02 NOTE — Telephone Encounter (Addendum)
Received call from [facility], [place]. They were unable to do blood patch because patient is taking Plavix. She stated patient could continue taking ASA, and patient asked if she could increase dose to 325 mg. Sharyn Lull told patient that was up to neurologist. Sharyn Lull sent a fax attn Dr Leta Baptist re: hold Plavix x 5 days and then do blood patch. They have sent her home to continue to rest. Sent to Dr Leta Baptist. Fax placed on MD's desk.  Dr Leta Baptist will defer to Dr Leonie Man for anti-platelet management for blood patch. Patient called with concerns that waiting for blood patch was "dangerous". Per Dr Leta Baptist reassured her it is not dangerous. Per Dr Leta Baptist advised he can order CT head and Fioricet Rx. She agreed to both. Orders placed. She understands Raquel Sarna will call her when insurance approves CT head.

## 2020-08-02 NOTE — Telephone Encounter (Signed)
Spoke with patient and informed her CT Head scan was normal. She then stated she got my chart about stopping Plavix and is uncertain about being off Plavix. She got call from [place] who told her blood patch requires precert, could not do any sooner than probably Tues. She is trying to decide whether to go off plavix or not. I advised it is her decision, and she would be on ASA 325 mg while off plavix. She decided to wait and see how she feels tomorrow to make her decision, Patient verbalized understanding, appreciation.

## 2020-08-02 NOTE — Telephone Encounter (Signed)
Patient would like you to call her about her condition because a lot of things have been thrown at her at once and she not sure of what direction to go with since she hasnt seen any specialist yet. Please advise

## 2020-08-02 NOTE — Telephone Encounter (Signed)
I spoke with Marissa Burnett with [name] (who ordered the epidural blood patch) about Marissa Burnett and told her that we were not able to perform her epidural blood patch today because she is on Plavix since 07/28/20.  I told her we just faxed a clearance request to hold this medication for five days so that we might do the blood patch early next week if [name] still has her spinal headache.  I realize the answer may be "no," which is fine, but that I had to inquire on the patient's behalf.  She was quite upset when she left our office not having the procedure done.  My Dr. Jeralyn Ruths did explain to the patient that an epidural injection was a high risk procedure while on an anticoagulant and that we weren't even sure how effective the procedure would be if the blood we would inject couldn't clot because of the Plavix, possibly making the procedure less effective or even ineffective.

## 2020-08-02 NOTE — Telephone Encounter (Signed)
[facility] Josem Kaufmann: [phone] (exp. 08/02/20 to 09/01/20)   FYI patient is scheduled at [facility] for today. I spoke with the patient and she is aware to head over to [facility] now. Also, [name] they have your cell phone number for the call back report.

## 2020-08-02 NOTE — Telephone Encounter (Signed)
Dr Leonie Man came into office, signed Plavix form form Marissa Burnett, faxed it back with receipt of confirmation. I sent her my chart to advise she may stop Plavix x 5 days, increase ASA to 325 mg daily per Dr Leonie Man. I gave her # to [facility].

## 2020-08-02 NOTE — Telephone Encounter (Signed)
Started working process 08/01/2020 spoke to patient while she was in The ER Relayed to her that she most likely could not get blood patch done if she came in the ER there would be be no one in IR to do blood Patch . Patient stated she would wait and see. I relayed to to patient I would start process for GI Because I could ger her in with them 08/02/2020 in the am time is now 08/01/2020 4:45 pm. Stanton Kidney RN re- orders Blood Patch for GI as Urgent . I called Roberta Left her a message so she would have first thing 08/02/2020.  Called Patient back and relayed I would be in touch with her in the am 08/02/2020 Patient was ok with this she was still in the ER waiting.   08/02/2020.  7:00 am called GI and spoke to [facility] and they will do Blood Patch today around 10:30 am . When Roberta Gets to work at 8:30 she will call to tell patient details about blood patch.  I called and spoke to patient and told her to that Angelita Ingles would be calling her this am to have blood done and to arrange for driver . Patient was happy and under stood details. Patient thanks [place] Staff and Doctor's

## 2020-08-02 NOTE — Telephone Encounter (Signed)
Continue pain / headaches. Cannot get blood patch today due to current plavix and aspirin use.   Will check CT head to rule out other causes of headache.   Will send in fioricet rx.  Meds ordered this encounter  Medications  . butalbital-acetaminophen-caffeine (FIORICET) 50-325-40 MG tablet    Sig: Take 1 tablet by mouth every 8 (eight) hours as needed for headache.    Dispense:  21 tablet    Refill:  0    Orders Placed This Encounter  Procedures  . CT HEAD WO CONTRAST    Penni Bombard, MD 08/05/8547, 83:01 AM Certified in Neurology, Neurophysiology and Neuroimaging  [facility] [address] [place], [place] [address] [phone]

## 2020-08-02 NOTE — Discharge Instructions (Signed)

## 2020-08-02 NOTE — Telephone Encounter (Signed)
error 

## 2020-08-02 NOTE — Telephone Encounter (Signed)
Please advise when can patient be worked in the schedule for office visit

## 2020-08-03 ENCOUNTER — Telehealth (HOSPITAL_COMMUNITY): Payer: Self-pay

## 2020-08-03 ENCOUNTER — Encounter: Payer: Self-pay | Admitting: *Deleted

## 2020-08-03 ENCOUNTER — Other Ambulatory Visit (HOSPITAL_COMMUNITY): Payer: Self-pay | Admitting: *Deleted

## 2020-08-03 ENCOUNTER — Encounter (HOSPITAL_COMMUNITY): Payer: Self-pay | Admitting: Cardiology

## 2020-08-03 DIAGNOSIS — Q2112 Patent foramen ovale: Secondary | ICD-10-CM

## 2020-08-03 NOTE — Telephone Encounter (Signed)
Front Please give patient light duty work note for August due to recent stroke and cardiac illness. Thanks-Dr. Nicki Reaper please forwarded to the patient via [place]

## 2020-08-03 NOTE — Telephone Encounter (Signed)
Patient called wanting to know if it was ok if she still had her TEE tomorrow after having a lumbar puncture last week. Per Dr. Aundra Dubin patient is ok to still have TEE tomorrow.

## 2020-08-04 ENCOUNTER — Encounter: Payer: Self-pay | Admitting: Family Medicine

## 2020-08-04 ENCOUNTER — Ambulatory Visit (HOSPITAL_COMMUNITY): Payer: No Typology Code available for payment source | Admitting: Certified Registered Nurse Anesthetist

## 2020-08-04 ENCOUNTER — Ambulatory Visit (HOSPITAL_COMMUNITY)
Admission: RE | Admit: 2020-08-04 | Discharge: 2020-08-04 | Disposition: A | Discharge status: Home / Self Care | Payer: No Typology Code available for payment source | Attending: Cardiology | Admitting: Cardiology

## 2020-08-04 ENCOUNTER — Encounter (HOSPITAL_COMMUNITY)
Admission: RE | Disposition: A | Discharge status: Home / Self Care | Payer: No Typology Code available for payment source | Source: Home / Self Care | Attending: Cardiology

## 2020-08-04 ENCOUNTER — Encounter (HOSPITAL_COMMUNITY): Payer: Self-pay | Admitting: Cardiology

## 2020-08-04 ENCOUNTER — Ambulatory Visit (HOSPITAL_BASED_OUTPATIENT_CLINIC_OR_DEPARTMENT_OTHER)
Admission: RE | Admit: 2020-08-04 | Discharge: 2020-08-04 | Disposition: A | Discharge status: Home / Self Care | Payer: No Typology Code available for payment source | Source: Ambulatory Visit | Attending: Cardiology | Admitting: Cardiology

## 2020-08-04 DIAGNOSIS — Z8249 Family history of ischemic heart disease and other diseases of the circulatory system: Secondary | ICD-10-CM | POA: Diagnosis not present

## 2020-08-04 DIAGNOSIS — Z9109 Other allergy status, other than to drugs and biological substances: Secondary | ICD-10-CM

## 2020-08-04 DIAGNOSIS — Z79899 Other long term (current) drug therapy: Secondary | ICD-10-CM | POA: Insufficient documentation

## 2020-08-04 DIAGNOSIS — Z7902 Long term (current) use of antithrombotics/antiplatelets: Secondary | ICD-10-CM | POA: Diagnosis not present

## 2020-08-04 DIAGNOSIS — K219 Gastro-esophageal reflux disease without esophagitis: Secondary | ICD-10-CM | POA: Insufficient documentation

## 2020-08-04 DIAGNOSIS — Z8673 Personal history of transient ischemic attack (TIA), and cerebral infarction without residual deficits: Secondary | ICD-10-CM | POA: Diagnosis not present

## 2020-08-04 DIAGNOSIS — Z7982 Long term (current) use of aspirin: Secondary | ICD-10-CM | POA: Diagnosis not present

## 2020-08-04 DIAGNOSIS — Q211 Atrial septal defect: Secondary | ICD-10-CM | POA: Insufficient documentation

## 2020-08-04 HISTORY — PX: TEE WITHOUT CARDIOVERSION: SHX5443

## 2020-08-04 SURGERY — ECHOCARDIOGRAM, TRANSESOPHAGEAL
Anesthesia: Monitor Anesthesia Care

## 2020-08-04 MED ORDER — PROPOFOL 500 MG/50ML IV EMUL
INTRAVENOUS | Status: DC | PRN
  Administered 2020-08-04: 100 ug/kg/min via INTRAVENOUS

## 2020-08-04 MED ORDER — BUTAMBEN-TETRACAINE-BENZOCAINE 2-2-14 % EX AERO
INHALATION_SPRAY | CUTANEOUS | Status: DC | PRN
  Administered 2020-08-04: 2 via TOPICAL

## 2020-08-04 MED ORDER — LIDOCAINE 2% (20 MG/ML) 5 ML SYRINGE
INTRAMUSCULAR | Status: DC | PRN
Start: 2020-08-04 — End: 2020-08-04
  Administered 2020-08-04: 100 mg via INTRAVENOUS

## 2020-08-04 MED ORDER — PROPOFOL 10 MG/ML IV BOLUS
INTRAVENOUS | Status: DC | PRN
  Administered 2020-08-04: 30 mg via INTRAVENOUS
  Administered 2020-08-04: 10 mg via INTRAVENOUS
  Administered 2020-08-04 (×3): 20 mg via INTRAVENOUS

## 2020-08-04 MED ORDER — SODIUM CHLORIDE 0.9 % IV SOLN: INTRAVENOUS | Status: DC | PRN

## 2020-08-04 NOTE — CV Procedure (Addendum)
Procedure: TEE  Sedation: Per anesthesiology  Indication: h/o CVA, suspected PFO.   Findings: Please see echo section for full report.  Normal LV size and wall thickness, EF 60-65%.  Normal wall motion. The RV was borderline dilated with normal systolic function.  Normal left atrial size, no LA appendage thrombus.  Normal right atrial size. There was a secundum ASD present with left to right flow.  On 3D, dimensions were 0.8 cm x 1.1 cm with area 0.94 cm^2. We did not do a bubble study as this was previously done (transcranial dopplers) and was positive.  No significant TR, trivial PI.  The aortic valve was trileaflet with no regurgitation or stenosis.  No mitral regurgitation or stenosis.  Normal caliber thoracic aorta with no significant plaque noted.   Impression: Secundum ASD with left to right flow, see dimensions above.  With CVA, she will need consideration of ASD closure.  Has appt with Dr. Burt Knack.    Marissa Burnett 08/04/2020 8:40 AM

## 2020-08-04 NOTE — Progress Notes (Signed)
Echocardiogram Echocardiogram Transesophageal has been performed.  Oneal Deputy Ilia Dimaano 08/04/2020, 8:58 AM

## 2020-08-04 NOTE — Transfer of Care (Signed)
Immediate Anesthesia Transfer of Care Note  Patient: Marissa Burnett  Procedure(s) Performed: TRANSESOPHAGEAL ECHOCARDIOGRAM (TEE) (N/A )  Patient Location: Endoscopy Unit  Anesthesia Type:MAC  Level of Consciousness: awake, alert  and oriented  Airway & Oxygen Therapy: Patient Spontanous Breathing  Post-op Assessment: Report given to RN and Post -op Vital signs reviewed and stable  Post vital signs: Reviewed and stable  Last Vitals:  Vitals Value Taken Time  BP    Temp    Pulse    Resp    SpO2      Last Pain:  Vitals:   08/04/20 0720  TempSrc: Oral  PainSc: 0-No pain         Complications: No complications documented.

## 2020-08-04 NOTE — Anesthesia Postprocedure Evaluation (Signed)
Anesthesia Post Note  Patient: Marissa Burnett  Procedure(s) Performed: TRANSESOPHAGEAL ECHOCARDIOGRAM (TEE) (N/A )     Patient location during evaluation: Endoscopy Anesthesia Type: MAC Level of consciousness: awake and alert Pain management: pain level controlled Vital Signs Assessment: post-procedure vital signs reviewed and stable Respiratory status: spontaneous breathing, nonlabored ventilation and respiratory function stable Cardiovascular status: blood pressure returned to baseline and stable Postop Assessment: no apparent nausea or vomiting Anesthetic complications: no   No complications documented.  Last Vitals:  Vitals:   08/04/20 0850 08/04/20 0858  BP: 113/84 114/76  Pulse:    Resp: 18 18  Temp: 37.4 C   SpO2: (!) 20% (!) 17%    Last Pain:  Vitals:   08/04/20 0858  TempSrc:   PainSc: 0-No pain                 Lidia Collum

## 2020-08-04 NOTE — Discharge Instructions (Signed)
Transesophageal Echocardiogram Transesophageal echocardiogram (TEE) is a test that uses sound waves to take pictures of your heart. TEE is done by passing a flexible tube down the esophagus. The esophagus is the tube that carries food from the throat to the stomach. The pictures give detailed images of your heart. This can help your doctor see if there are problems with your heart. What happens before the procedure? Staying hydrated Follow instructions from your doctor about hydration, which may include:  Up to 3 hours before the procedure - you may continue to drink clear liquids, such as: ? Water. ? Clear fruit juice. ? Black coffee. ? Plain tea.  Eating and drinking Follow instructions from your doctor about eating and drinking, which may include:  8 hours before the procedure - stop eating heavy meals or foods such as meat, fried foods, or fatty foods.  6 hours before the procedure - stop eating light meals or foods, such as toast or cereal.  6 hours before the procedure - stop drinking milk or drinks that contain milk.  3 hours before the procedure - stop drinking clear liquids. General instructions  You will need to take out any dentures or retainers.  Plan to have someone take you home from the hospital or clinic.  If you will be going home right after the procedure, plan to have someone with you for 24 hours.  Ask your doctor about: ? Changing or stopping your normal medicines. This is important if you take diabetes medicines or blood thinners. ? Taking over-the-counter medicines, vitamins, herbs, and supplements. ? Taking medicines such as aspirin and ibuprofen. These medicines can thin your blood. Do not take these medicines unless your doctor tells you to take them. What happens during the procedure?  To lower your risk of infection, your doctors will wash or clean their hands.  An IV will be put into one of your veins.  You will be given a medicine to help you  relax (sedative).  A medicine may be sprayed or gargled. This numbs the back of your throat.  Your blood pressure, heart rate, and breathing will be watched.  You may be asked to lay on your left side.  A bite block will be placed in your mouth. This keeps you from biting the tube.  The tip of the TEE probe will be placed into the back of your mouth.  You will be asked to swallow.  Your doctor will take pictures of your heart.  The probe and bite block will be taken out. The procedure may vary among doctors and hospitals. What happens after the procedure?   Your blood pressure, heart rate, breathing rate, and blood oxygen level will be watched until the medicines you were given have worn off.  When you first wake up, your throat may feel sore and numb. This will get better over time. You will not be allowed to eat or drink until the numbness has gone away.  Do not drive for 24 hours if you were given a medicine to help you relax. Summary  TEE is a test that uses sound waves to take pictures of your heart.  You will be given a medicine to help you relax.  Do not drive for 24 hours if you were given a medicine to help you relax. This information is not intended to replace advice given to you by your health care provider. Make sure you discuss any questions you have with your health care provider. Document Revised:   09/04/2018 Document Reviewed: 03/19/2017 Elsevier Patient Education  2020 Elsevier Inc.  

## 2020-08-04 NOTE — Interval H&P Note (Signed)
History and Physical Interval Note:  08/04/2020 8:17 AM  Marissa Burnett  has presented today for surgery, with the diagnosis of TIA.  The various methods of treatment have been discussed with the patient and family. After consideration of risks, benefits and other options for treatment, the patient has consented to  Procedure(s): TRANSESOPHAGEAL ECHOCARDIOGRAM (TEE) (N/A) as a surgical intervention.  The patient's history has been reviewed, patient examined, no change in status, stable for surgery.  I have reviewed the patient's chart and labs.  Questions were answered to the patient's satisfaction.     Ronnica Dreese [employer]

## 2020-08-04 NOTE — Anesthesia Preprocedure Evaluation (Signed)
Anesthesia Evaluation  Patient identified by MRN, date of birth, ID band Patient awake    Reviewed: Allergy & Precautions, NPO status , Patient's Chart, lab work & pertinent test results  History of Anesthesia Complications Negative for: history of anesthetic complications  Airway Mallampati: I  TM Distance: >3 FB Neck ROM: Full    Dental  (+) Teeth Intact   Pulmonary neg pulmonary ROS,    Pulmonary exam normal        Cardiovascular negative cardio ROS Normal cardiovascular exam     Neuro/Psych Anxiety CVA, No Residual Symptoms    GI/Hepatic Neg liver ROS, GERD  ,  Endo/Other  negative endocrine ROS  Renal/GU negative Renal ROS  negative genitourinary   Musculoskeletal negative musculoskeletal ROS (+)   Abdominal   Peds  Hematology negative hematology ROS (+)   Anesthesia Other Findings   Reproductive/Obstetrics                             Anesthesia Physical Anesthesia Plan  ASA: III  Anesthesia Plan: MAC   Post-op Pain Management:    Induction: Intravenous  PONV Risk Score and Plan: 2 and Propofol infusion, TIVA and Treatment may vary due to age or medical condition  Airway Management Planned: Natural Airway, Nasal Cannula and Simple Face Mask  Additional Equipment: None  Intra-op Plan:   Post-operative Plan:   Informed Consent: I have reviewed the patients History and Physical, chart, labs and discussed the procedure including the risks, benefits and alternatives for the proposed anesthesia with the patient or authorized representative who has indicated his/her understanding and acceptance.       Plan Discussed with:   Anesthesia Plan Comments:         Anesthesia Quick Evaluation

## 2020-08-06 ENCOUNTER — Telehealth: Payer: Self-pay | Admitting: Neurology

## 2020-08-06 NOTE — Telephone Encounter (Signed)
The patient called on 05 August 2020.  The patient had an episode lasting about 15 minutes of visual changes with squiggly lines in the vision.  She did not yet have a headache, the visual changes were starting to improve after about 15 minutes.  No other associated numbness, weakness, speech disturbance, or gait disturbance was noted.  The patient is on antiplatelet agents.  She recently was evaluated for transient speech alteration and slurred speech, left hand weakness and clumsiness.  The patient was found to have left middle cerebral artery distribution punctate infarcts (2) with small areas of enhancement suggesting a subacute stroke event.  The patient had a headache with her symptoms above.  A hypercoagulable state, vasculitis work-up was completely unremarkable.  The patient likely has migraine, and likely had small punctate infarct associated with this.  The above visual changes are likely migrainous in nature.  The patient will remain on her antiplatelet agent and treat the headache if that occurs.

## 2020-08-07 ENCOUNTER — Telehealth: Payer: Self-pay | Admitting: Cardiovascular Disease

## 2020-08-07 ENCOUNTER — Telehealth: Payer: Self-pay | Admitting: Allergy & Immunology

## 2020-08-07 NOTE — Telephone Encounter (Signed)
Spoke with pt and advised Dr Burt Knack and his RN are out of office today 08/07/2020 but will return tomorrow 08/08/2020.  Will forward information to Dr Antionette Char nurse for review.  Pt verbalizes understanding and agrees with current plan.  Pt thanked Therapist, sports for call.

## 2020-08-07 NOTE — Telephone Encounter (Signed)
°  Patient would like to know if she needs to do anything special or be cautious about anything until she gets seen by Dr Burt Knack. Does she have any restrictions?

## 2020-08-07 NOTE — Telephone Encounter (Signed)
Agree. She seems to be getting better on her own. So hold off on spinal tap. Plavix can cause metallic taste usually transiently.

## 2020-08-07 NOTE — Telephone Encounter (Signed)
Patient needs to be tested for nickel allergy. She is having a procedure that will involve nickel in the middle of September. Patient tested positive to nickel allergy around the age of 62, but has not had a problem in a while.   Patient was last seen 04/14/2018. Please advise if patient needs a follow up first or if patch testing can be scheduled.

## 2020-08-08 NOTE — Telephone Encounter (Signed)
Spoke with the patient in detail about options for upcoming closure.  She will keep consult as scheduled at this time and will schedule for closure tentatively on 9/1. She understands if she and Dr. Burt Knack agree to ASD closure, she will get instructions that day. She was grateful for assistance.

## 2020-08-08 NOTE — Telephone Encounter (Signed)
Patient called back to check the status of the message. Patient states the cardiologist scheduled her appointment for 08/30/2020

## 2020-08-08 NOTE — Telephone Encounter (Signed)
We can just schedule for patch testing. No problem.   Salvatore Marvel, MD [facility]

## 2020-08-09 NOTE — Addendum Note (Signed)
Addended by: Vicente Males on: 08/09/2020 08:27 AM   Modules accepted: Orders

## 2020-08-09 NOTE — Telephone Encounter (Signed)
Staff Please put in urgent referral to allergist because patient has upcoming surgical procedure first part of September concerned about nickel allergy  Please see her note Dr. Ernst Bowler please

## 2020-08-11 ENCOUNTER — Encounter: Payer: Self-pay | Admitting: Family Medicine

## 2020-08-11 ENCOUNTER — Telehealth: Payer: Self-pay | Admitting: Family Medicine

## 2020-08-11 NOTE — Telephone Encounter (Signed)
Front Please submit her form for vaccine exemption along with a letter that was dictated.

## 2020-08-11 NOTE — Telephone Encounter (Signed)
Letter faxed over with form to health at works

## 2020-08-15 ENCOUNTER — Telehealth: Payer: Self-pay | Admitting: Cardiovascular Disease

## 2020-08-15 NOTE — Telephone Encounter (Signed)
Pt c/o medication issue:  1. Name of Medication: clopidogrel (PLAVIX) 75 MG tablet and aspirin EC 81 MG EC tablet  2. How are you currently taking this medication (dosage and times per day)? As directed  3. Are you having a reaction (difficulty breathing--STAT)? yes  4. What is your medication issue? Since starting this medication she has noticed stomach cramping and bloating. She would like to speak with someone today in regards to this.

## 2020-08-15 NOTE — Telephone Encounter (Signed)
I spoke with patient. She complains of bloating and stomach cramping for the last 3 weeks.  Thinks it started about the time she began taking Plavix and ASA.  Per discharge note --patient to continue Plavix and aspirin per recommendations per neurology. I advised patient to continue Plavix and ASA and to call neurology to discuss cramping and bloating.  I explained to patient medication was started by neurology. Patient aware to follow up with Dr Burt Knack as scheduled.

## 2020-08-21 ENCOUNTER — Encounter: Payer: Self-pay | Admitting: Cardiovascular Disease

## 2020-08-21 ENCOUNTER — Ambulatory Visit: Payer: No Typology Code available for payment source | Admitting: Family

## 2020-08-21 ENCOUNTER — Ambulatory Visit (INDEPENDENT_AMBULATORY_CARE_PROVIDER_SITE_OTHER): Payer: No Typology Code available for payment source | Admitting: Cardiovascular Disease

## 2020-08-21 ENCOUNTER — Other Ambulatory Visit: Payer: Self-pay

## 2020-08-21 ENCOUNTER — Encounter: Payer: Self-pay | Admitting: Family

## 2020-08-21 VITALS — BP 128/72 | HR 103 | Temp 98.7°F | Resp 16 | Ht 64.0 in | Wt 162.8 lb

## 2020-08-21 VITALS — BP 122/72 | HR 94 | Ht 64.0 in | Wt 161.0 lb

## 2020-08-21 DIAGNOSIS — J302 Other seasonal allergic rhinitis: Secondary | ICD-10-CM | POA: Diagnosis not present

## 2020-08-21 DIAGNOSIS — J3089 Other allergic rhinitis: Secondary | ICD-10-CM

## 2020-08-21 DIAGNOSIS — Q211 Atrial septal defect, unspecified: Secondary | ICD-10-CM

## 2020-08-21 DIAGNOSIS — T7800XD Anaphylactic reaction due to unspecified food, subsequent encounter: Secondary | ICD-10-CM

## 2020-08-21 DIAGNOSIS — L230 Allergic contact dermatitis due to metals: Secondary | ICD-10-CM | POA: Diagnosis not present

## 2020-08-21 MED ORDER — EPINEPHRINE 0.3 MG/0.3ML IJ SOAJ: 0.3000 mg | Freq: Once | INTRAMUSCULAR | 1 refills | Status: DC | PRN

## 2020-08-21 MED FILL — EPINEPHRINE 0.3 MG AUTO-INJ: 0.3 | 2 days supply | Qty: 2 | Fill #0

## 2020-08-21 NOTE — Patient Instructions (Addendum)
COVID SCREENING INFORMATION (8/30): You are scheduled for your drive-thru COVID screening on 8/30 between 9AM and 2:30PM. Pre-Procedural COVID-19 Testing Site [address] [place], [place] [address] You will need to go home after your screening and quarantine until your procedure.  CLOSURE INSTRUCTIONS (9/1): You are scheduled for an ASD CLOSURE on Wednesday, September 1 with Dr. Sherren Mocha.  1. Please arrive at the [facility] (Main Entrance A) at [facility]: [address] [place], [place] [address] at 6:30 AM (This time is two hours before your procedure to ensure your preparation). Free valet parking service is available. You are allowed ONE visitor in the waiting room during your procedure. Both you and your guest must wear masks.  Special note: Every effort is made to have your procedure done on time. Please understand that emergencies sometimes delay scheduled procedures.  2. Diet: Stay well hydrated the days prior to your procedure! Do not eat solid foods after midnight. You may have clear liquids until 5am upon the day of the procedure.  3. Labs: TODAY! BMET, CBC  4. Medication instructions in preparation for your procedure:  1) Make sure to take your ASPIRIN and PLAVIX the morning of your procedure  2) You may take your other medications as directed with sips of water  5. Plan for one night stay--bring personal belongings. 6. Bring a current list of your medications and current insurance cards. 7. You MUST have a responsible person to drive you home. 8. Someone MUST be with you the first 24 hours after you arrive home or your discharge will be delayed. 9. Please wear clothes that are easy to get on and off and wear slip-on shoes.  Thank you for allowing [place] to care for you!   -- [facility]  FOLLOW-UP APPOINTMENT: Your provider recommends that you schedule a follow-up appointment 1 month post procedure with Nell Range, PA.

## 2020-08-21 NOTE — Progress Notes (Signed)
Cardiology Office Note:    Date:  08/21/2020   ID:  Marissa Burnett, DOB October 20, 1986, MRN [id number]  PCP:  Marissa Drown, MD  [facility] Cardiologist:  No primary care provider on file.  [facility] Electrophysiologist:  None   Referring MD: Marissa Drown, MD   Chief Complaint  Patient presents with  . Atrial Septal Defect    History of Present Illness:    Marissa Burnett is a 34 y.o. female referred by Dr Marissa Burnett for consideration of ASD closure. She has a hx of migraine with aura, but recently presented with visual aura along with expressive aphasia, hand clumsiness, and numbness. She was at work when her event happened. She was in the bathroom straining and symptoms began abruptly when she stood up to wash her hands.  She was found to have 2-3 punctate ischemic infarcts in the left MCA territory and was sent to the emergency room for evaluation.  Stroke evaluation otherwise showed no evidence of large vessel vascular disease.  LV function was normal by echo assessment and there was no valvular disease. Hypercoagulable panel was negative. She underwent a transcranial Doppler study that showed Marissa Burnett grade 3 shunt at rest and grade 4 shunt with Valsalva suggestive of PFO.  She underwent an outpatient transesophageal echo by Dr. Aundra Burnett on August 04, 2020 and was found to have normal LV size and function with an LVEF of 60 to 65%.  The RV was felt to be borderline dilated with normal RV systolic function.  There was a secundum ASD present with left-to-right flow with secundum measurements of 0.8 x 1.1 cm.  There is no significant valvular disease noted. She had a lumbar puncture done as part of her evaluation and had a headache afterwards. It took her several days to get over this.   She is here with her husband today. She was told that she has a heart murmur as an infant, but hasn't had regular follow-up because she was told that it went away. She hasn't had any problems with shortness of  breath, chest pain, or leg swelling. She has had some tachypalpitations after drinking alcohol, but these go away with hydration.   Past Medical History:  Diagnosis Date  . Abnormal Papanicolaou smear of cervix 10/15/2016   LSIL+HPV  . Allergy   . Anxiety   . ASD (atrial septal defect) 07/28/2020  . Chronic lumbar pain 09/27/2018   Patient has seen Dr. Arnoldo Burnett neurosurgery September 2019-they do not feel there is anything surgical that can help-they are recommending injections with pain medicine specialist  . Dysrhythmia    history of palpitations   . Gallstones   . GERD (gastroesophageal reflux disease)   . Heart murmur birth  . Stroke Marissa Burnett) 07/12/2020    Past Surgical History:  Procedure Laterality Date  . ABDOMINOPLASTY    . BREAST ENHANCEMENT SURGERY    . CERVICAL CONIZATION W/BX N/A 12/13/2016   Procedure: CONIZATION CERVIX WITH BIOPSY--cold knife conization;  Surgeon: Jonnie Kind, MD;  Location: [facility];  Service: Gynecology;  Laterality: N/A;  . CHOLECYSTECTOMY  02/2002  . ENDOSCOPIC RETROGRADE CHOLANGIOPANCREATOGRAPHY (ERCP) WITH PROPOFOL    . OTHER SURGICAL HISTORY     stent in kidney during pregnancy,2002; removed in 2003  . TEE WITHOUT CARDIOVERSION N/A 08/04/2020   Procedure: TRANSESOPHAGEAL ECHOCARDIOGRAM (TEE);  Surgeon: Larey Dresser, MD;  Location: [facility];  Service: Cardiovascular;  Laterality: N/A;    Current Medications: Current Meds  Medication Sig  .  acetaminophen (TYLENOL) 500 MG tablet Take 1,000 mg by mouth every 6 (six) hours as needed for headache.  . ALPRAZolam (XANAX) 0.5 MG tablet TAKE 1 TABLET(0.5 MG) BY MOUTH TWICE DAILY AS NEEDED FOR ANXIETY  . aspirin EC 81 MG EC tablet Take 1 tablet (81 mg total) by mouth daily. Swallow whole.  Marland Kitchen BIOTIN PO Take 1 tablet by mouth daily.   . butalbital-acetaminophen-caffeine (FIORICET) 50-325-40 MG tablet Take 1 tablet by mouth every 8 (eight) hours as needed for headache.  . clopidogrel (PLAVIX) 75 MG  tablet Take 1 tablet (75 mg total) by mouth daily.  Marland Kitchen EPINEPHrine 0.3 mg/0.3 mL IJ SOAJ injection Inject 0.3 mLs (0.3 mg total) into the muscle once as needed for up to 1 dose for anaphylaxis.  Marland Kitchen loratadine (CLARITIN) 10 MG tablet Take 10 mg by mouth daily.  . pantoprazole (PROTONIX) 40 MG tablet TAKE 1 TABLET (40 MG TOTAL) BY MOUTH DAILY.     Allergies:   Peanut-containing drug products   Social History   Socioeconomic History  . Marital status: Married    Spouse name: Not on file  . Number of children: 2  . Years of education: Not on file  . Highest education level: Not on file  Occupational History  . Occupation: resp therapist    Employer: [employer]  Tobacco Use  . Smoking status: Never Smoker  . Smokeless tobacco: Never Used  Vaping Use  . Vaping Use: Never used  Substance and Sexual Activity  . Alcohol use: Yes    Comment: social  . Drug use: No  . Sexual activity: Yes    Birth control/protection: None  Other Topics Concern  . Not on file  Social History Narrative   Married, respiratory therapist Zacarias Pontes Burnett   2 children   Occasional alcohol, never smoker no drug use   Previous work Personnel officer   Social Determinants of Radio broadcast assistant Strain:   . Difficulty of Paying Living Expenses: Not on file  Food Insecurity:   . Worried About Charity fundraiser in the Last Year: Not on file  . Ran Out of Food in the Last Year: Not on file  Transportation Needs:   . Lack of Transportation (Medical): Not on file  . Lack of Transportation (Non-Medical): Not on file  Physical Activity:   . Days of Exercise per Week: Not on file  . Minutes of Exercise per Session: Not on file  Stress:   . Feeling of Stress : Not on file  Social Connections:   . Frequency of Communication with Friends and Family: Not on file  . Frequency of Social Gatherings with Friends and Family: Not on file  . Attends Religious Services: Not on file  .  Active Member of Clubs or Organizations: Not on file  . Attends Archivist Meetings: Not on file  . Marital Status: Not on file     Family History: The patient's family history includes ADD / ADHD in her daughter; Anxiety disorder in her daughter; COPD in her maternal grandfather; Colon polyps in her paternal grandmother; Diabetes in her maternal grandmother and another family member; Heart disease in her paternal grandmother; Irritable bowel syndrome in her paternal grandmother; Lung cancer in her paternal grandfather. There is no history of Allergic rhinitis, Angioedema, Asthma, Atopy, Eczema, Immunodeficiency, or Urticaria.  ROS:   Please see the history of present illness.    All other systems reviewed and are negative.  EKGs/Labs/Other Studies Reviewed:    The following studies were reviewed today: TEE images reviewed. Report below: Impression: Secundum ASD with left to right flow, see dimensions above.  With CVA, she will need consideration of ASD closure.  Has appt with Dr. Burt Knack.    EKG:  EKG is ordered today.  The ekg ordered today demonstrates sinus rhythm 94 bpm, incomplete RBBB, otherwise normal  Recent Labs: 07/27/2020: ALT 13; BUN 12; Creatinine, Ser 0.72; Hemoglobin 14.1; Platelets 192; Potassium 3.6; Sodium 137  Recent Lipid Panel    Component Value Date/Time   CHOL 135 07/28/2020 0351   CHOL 146 10/14/2018 1026   TRIG 46 07/28/2020 0351   HDL 57 07/28/2020 0351   HDL 64 10/14/2018 1026   CHOLHDL 2.4 07/28/2020 0351   VLDL 9 07/28/2020 0351   LDLCALC 69 07/28/2020 0351   LDLCALC 69 10/14/2018 1026    Physical Exam:    VS:  BP 122/72   Pulse 94   Ht 5\' 4"  (1.626 m)   Wt 161 lb (73 kg)   LMP 08/01/2020   SpO2 99%   BMI 27.64 kg/m     Wt Readings from Last 3 Encounters:  08/21/20 161 lb (73 kg)  08/21/20 162 lb 12.8 oz (73.8 kg)  08/04/20 159 lb 9.6 oz (72.4 kg)     GEN:  Well nourished, well developed in no acute distress HEENT:  Normal NECK: No JVD; No carotid bruits LYMPHATICS: No lymphadenopathy CARDIAC: RRR, no murmurs, rubs, gallops RESPIRATORY:  Clear to auscultation without rales, wheezing or rhonchi  ABDOMEN: Soft, non-tender, non-distended MUSCULOSKELETAL:  No edema; No deformity  SKIN: Warm and dry NEUROLOGIC:  Alert and oriented x 3 PSYCHIATRIC:  Normal affect   ASSESSMENT:    1. ASD (atrial septal defect)    PLAN:    In order of problems listed above:  1. The patient has an ostium secundum ASD recently diagnosed after suffering a small embolic stroke.  She has no other stroke risk factors identified with a rope score of 8 suggesting high probability of a paradoxical embolus as the etiology of her stroke.  In addition, a Valsalva event initiated her stroke symptoms.  I have personally reviewed her TEE which shows a moderate sized secundum ASD with associated atrial septal aneurysm.  The ASD measures approximately 11 mm in diameter.  We discussed no clinical association of ASD/PFO with cryptogenic stroke in the patient understands this association.  I personally reviewed the transcatheter closure procedure with the patient and her husband today.  I demonstrated the device.  We discussed her questionable history of nickel allergy.  It sounds like she has tested positive for nickel allergy in the past but that she is really not had any clinical evidence of a true nickel allergy.  She is in the process of undergoing further nickel allergy testing.  She understands that the device is made of nitinol (nickel titanium) and we discussed the potential for a reaction.  We also reviewed standard procedural risks which include bleeding, infection, device embolization, atrial fibrillation or other arrhythmia, stroke, heart attack, cardiac perforation, cardiac tamponade, emergency surgery, and vascular injury.  She understands that any of these risks occur at a very low incidence of less than 1% with the exception of atrial  fibrillation which occurs in 2 to 2.5% of patients.  After full review of procedural risks, indications, and expected recovery, the patient consents to the procedure.  She understands that we will use intracardiac echo to measure the  defect and decide on device sizing.   Medication Adjustments/Labs and Tests Ordered: Current medicines are reviewed at length with the patient today.  Concerns regarding medicines are outlined above.  Orders Placed This Encounter  Procedures  . CBC with Differential/Platelet  . Basic metabolic panel  . EKG 12-Lead   No orders of the defined types were placed in this encounter.   Patient Instructions  COVID SCREENING INFORMATION (8/30): You are scheduled for your drive-thru COVID screening on 8/30 between 9AM and 2:30PM. Pre-Procedural COVID-19 Testing Site [address] [place], [place] [address] You will need to go home after your screening and quarantine until your procedure.  CLOSURE INSTRUCTIONS (9/1): You are scheduled for an ASD CLOSURE on Wednesday, September 1 with Dr. Sherren Mocha.  1. Please arrive at the [facility] (Main Entrance A) at [facility]: [address] [place], [place] [address] at 6:30 AM (This time is two hours before your procedure to ensure your preparation). Free valet parking service is available. You are allowed ONE visitor in the waiting room during your procedure. Both you and your guest must wear masks.  Special note: Every effort is made to have your procedure done on time. Please understand that emergencies sometimes delay scheduled procedures.  2. Diet: Stay well hydrated the days prior to your procedure! Do not eat solid foods after midnight. You may have clear liquids until 5am upon the day of the procedure.  3. Labs: TODAY! BMET, CBC  4. Medication instructions in preparation for your procedure:  1) Make sure to take your ASPIRIN and PLAVIX the morning of your procedure  2) You may take your other  medications as directed with sips of water  5. Plan for one night stay--bring personal belongings. 6. Bring a current list of your medications and current insurance cards. 7. You MUST have a responsible person to drive you home. 8. Someone MUST be with you the first 24 hours after you arrive home or your discharge will be delayed. 9. Please wear clothes that are easy to get on and off and wear slip-on shoes.  Thank you for allowing [place] to care for you!   -- [facility]  FOLLOW-UP APPOINTMENT: Your provider recommends that you schedule a follow-up appointment 1 month post procedure with Nell Range, PA.    Signed, Sherren Mocha, MD  08/21/2020 5:03 PM    [place]

## 2020-08-21 NOTE — Progress Notes (Signed)
[place] [address] Dept: [phone]  FOLLOW UP NOTE  Patient ID: BELL CARBO, female    DOB: July 14, 1986  Age: 34 y.o. MRN: [id number] Date of Office Visit: 08/21/2020  Assessment  Chief Complaint: Allergy Testing (Patch testing for nickel. She had a positive allergy test years ago and had an allergic reaction to nickel when she was 12. She is due for a device implant next week that has nickel in it and wants reassurance that she has outgrown the nickel allergy before her surgery.)  HPI Marissa Burnett is a 34 year old female who presents today for patch test placement.  She is interested in getting a patch test to nickel due to having atrial septal defect surgery next Wednesday.  She has had 2 mini strokes since July of this year.  She reports that the device that is going to be used is the Amplazter device and after doing research she saw that the device contained nickel.  She would like patch testing to nickel to ease her mind.  She is going to see her cardiologist, Dr. Burt Knack for the first time today.  She reports that when she was approximately 34 years of age her rings would cause her skin to break out " bad" and was told by her dermatologist then that she could only wear platinum jewelry and to avoid nickel, gold/white gold and silver jewelry.  She now reports that she is able to wear all jewelry and costume jewelry without any problems.  She has not noticed any problems with a button on blue jeans that touches her skin and also reports if she does wear a belt buckle it is usually touching a shirt that is tucked in, so it is not touching her skin.  "The AMPLATZER PFO Occluder device consists of a nickel-titanium alloy, which is generally considered safe. However, in vitro testing has demonstrated that nickel is released from this device for a minimum of 60 days. Patients who are allergic to nickel may have an allergic reaction to this device, especially those  with a history of metal allergies. Certain allergic reactions can be serious; patients should be instructed to notify their physicians immediately if they suspect they are experiencing an allergic reaction such as difficulty breathing or inflammation of the face or throat. Some patients may also develop an allergy to nickel if this device is implanted."  "ADVERSE EVENTS  Allergic drug reaction; Allergic dye reaction; Allergic metal reaction: Nitinol (nickel, titanium), platinum/iridium, stainless steel (chromium, iron, manganese, molybdenum, nickel)"  She continues to avoid peanuts, but reports that she is able to eat shellfish (shrimp, crab, and lobster) without any problems.  She feels that the tightening in her throat she had after eating shellfish was due to acid reflux and having alcoholic beverages.  Her EpiPen is out of date and she would like a refill sent for her EpiPen.  Seasonal and perennial allergic rhinitis is reported as controlled with Claritin 10 mg once a day and Benadryl as needed.  She denies any rhinorrhea, nasal congestion, postnasal drip, and itchy watery eyes.  She also continues to avoid Neosporin and has not had any problems.  Current medications are as listed in the chart.     Drug Allergies:  Allergies  Allergen Reactions   Peanut-Containing Drug Products Anaphylaxis    THROAT GETS TIGHT, peanut butter    Review of Systems: Review of Systems  Constitutional: Negative for chills and fever.  HENT: Negative for congestion.  Denies rhinorrhea and post nasal drip  Eyes:       Denies itchy watery eyes  Respiratory: Negative for cough, shortness of breath and wheezing.   Cardiovascular: Positive for chest pain. Negative for palpitations.       Reports  Intermittent chest pain since learning about atrial septal defect and having stress. Will take Xanax and this will relieve chest pain  Gastrointestinal: Negative for abdominal pain and heartburn.    Genitourinary: Negative for dysuria.  Skin: Negative for itching and rash.  Neurological: Negative for headaches.  Endo/Heme/Allergies: Positive for environmental allergies.    Physical Exam: BP 128/72    Pulse (!) 103    Temp 98.7 F (37.1 C)    Resp 16    Ht 5\' 4"  (1.626 m)    Wt 162 lb 12.8 oz (73.8 kg)    LMP 08/01/2020    SpO2 99%    BMI 27.94 kg/m    Physical Exam Constitutional:      Appearance: Normal appearance.  HENT:     Head: Normocephalic and atraumatic.     Comments: Pharynx normal. Eyes normal. Ears normal. Nose normal.    Right Ear: Tympanic membrane, ear canal and external ear normal.     Left Ear: Tympanic membrane, ear canal and external ear normal.     Nose: Nose normal.     Mouth/Throat:     Mouth: Mucous membranes are dry.     Pharynx: Oropharynx is clear.  Eyes:     Conjunctiva/sclera: Conjunctivae normal.  Cardiovascular:     Rate and Rhythm: Regular rhythm.     Heart sounds: Normal heart sounds.  Pulmonary:     Effort: Pulmonary effort is normal.     Breath sounds: Normal breath sounds.     Comments: Lungs clear to auscultation Musculoskeletal:     Cervical back: Neck supple.  Skin:    General: Skin is warm.  Neurological:     Mental Status: She is alert and oriented to person, place, and time.  Psychiatric:        Mood and Affect: Mood normal.        Behavior: Behavior normal.        Thought Content: Thought content normal.        Judgment: Judgment normal.     Diagnostics: Follow-up Note  RE: Marissa Burnett MRN: [id number] DOB: Jun 19, 1986 Date of Office Visit: 08/21/2020  Primary care provider: [name], MD Referring provider: [name], MD   Lanita returns to the office today for the metal patch test placement, given suspected history of contact dermatitis.    Diagnostics: Metal patches placed.    Plan:   Allergic contact dermatitis - Instructions provided on care of the patches for the next 48 hours. Shantavia Jha was instructed to avoid showering for the next 48 hours. Daun Rens will follow up in 48 hours and 96 hours for patch readings.  Assessment and Plan: 1. Contact dermatitis due to metals, unspecified contact dermatitis type   2. Anaphylactic shock due to food, subsequent encounter   3. Seasonal and perennial allergic rhinitis     Meds ordered this encounter  Medications   EPINEPHrine 0.3 mg/0.3 mL IJ SOAJ injection    Sig: Inject 0.3 mLs (0.3 mg total) into the muscle once as needed for up to 1 dose for anaphylaxis.    Dispense:  2 each    Refill:  1    Patient Instructions  Allergic contact dermatitis  to metals - Instructions provided on care of the patches for the next 48 hours. Pamla Pangle was instructed to avoid showering for the next 48 hours. Angelize Ryce will follow up in 48 hours and 96 hours for patch readings.  Anaphylactic shock due to food Avoid peanuts. In case of an allergic reaction, give Benadryl 4 teaspoonfuls every 4 hours, and if life-threatening symptoms occur, inject with EpiPen 0.3 mg.  Seasonal and perennial allergic rhinitis ( molds, dust mites, cock roach) Continue Claritin 10 mg once a day as needed for runny nose  Contact dermatitis-likely neosporin Continue to avoid neosporin  Schedule follow up appointment this Wednesday and Friday   Return in about 2 days (around 08/23/2020), or if symptoms worsen or fail to improve, for 48 hour metal patch test read.    Thank you for the opportunity to care for this patient.  Please do not hesitate to contact me with questions.  Althea Charon, FNP [facility]

## 2020-08-21 NOTE — H&P (View-Only) (Signed)
Cardiology Office Note:    Date:  08/21/2020   ID:  ACADIA THAMMAVONG, DOB 1986/09/24, MRN [id number]  PCP:  [name], MD  [facility] Cardiologist:  No primary care provider on file.  [facility] Electrophysiologist:  None   Referring MD: [name], MD   Chief Complaint  Patient presents with  . Atrial Septal Defect    History of Present Illness:    Marissa Burnett is a 34 y.o. female referred by Dr Erlinda Hong for consideration of ASD closure. She has a hx of migraine with aura, but recently presented with visual aura along with expressive aphasia, hand clumsiness, and numbness. She was at work when her event happened. She was in the bathroom straining and symptoms began abruptly when she stood up to wash her hands.  She was found to have 2-3 punctate ischemic infarcts in the left MCA territory and was sent to the emergency room for evaluation.  Stroke evaluation otherwise showed no evidence of large vessel vascular disease.  LV function was normal by echo assessment and there was no valvular disease. Hypercoagulable panel was negative. She underwent a transcranial Doppler study that showed Spencer grade 3 shunt at rest and grade 4 shunt with Valsalva suggestive of PFO.  She underwent an outpatient transesophageal echo by Dr. Aundra Dubin on August 04, 2020 and was found to have normal LV size and function with an LVEF of 60 to 65%.  The RV was felt to be borderline dilated with normal RV systolic function.  There was a secundum ASD present with left-to-right flow with secundum measurements of 0.8 x 1.1 cm.  There is no significant valvular disease noted. She had a lumbar puncture done as part of her evaluation and had a headache afterwards. It took her several days to get over this.   She is here with her husband today. She was told that she has a heart murmur as an infant, but hasn't had regular follow-up because she was told that it went away. She hasn't had any problems with shortness of  breath, chest pain, or leg swelling. She has had some tachypalpitations after drinking alcohol, but these go away with hydration.   Past Medical History:  Diagnosis Date  . Abnormal Papanicolaou smear of cervix 10/15/2016   LSIL+HPV  . Allergy   . Anxiety   . ASD (atrial septal defect) 07/28/2020  . Chronic lumbar pain 09/27/2018   Patient has seen Dr. Arnoldo Morale neurosurgery September 2019-they do not feel there is anything surgical that can help-they are recommending injections with pain medicine specialist  . Dysrhythmia    history of palpitations   . Gallstones   . GERD (gastroesophageal reflux disease)   . Heart murmur birth  . Stroke [facility]) 07/12/2020    Past Surgical History:  Procedure Laterality Date  . ABDOMINOPLASTY    . BREAST ENHANCEMENT SURGERY    . CERVICAL CONIZATION W/BX N/A 12/13/2016   Procedure: CONIZATION CERVIX WITH BIOPSY--cold knife conization;  Surgeon: Jonnie Kind, MD;  Location: [facility];  Service: Gynecology;  Laterality: N/A;  . CHOLECYSTECTOMY  02/2002  . ENDOSCOPIC RETROGRADE CHOLANGIOPANCREATOGRAPHY (ERCP) WITH PROPOFOL    . OTHER SURGICAL HISTORY     stent in kidney during pregnancy,2002; removed in 2003  . TEE WITHOUT CARDIOVERSION N/A 08/04/2020   Procedure: TRANSESOPHAGEAL ECHOCARDIOGRAM (TEE);  Surgeon: Larey Dresser, MD;  Location: [facility] ENDOSCOPY;  Service: Cardiovascular;  Laterality: N/A;    Current Medications: Current Meds  Medication Sig  .  acetaminophen (TYLENOL) 500 MG tablet Take 1,000 mg by mouth every 6 (six) hours as needed for headache.  . ALPRAZolam (XANAX) 0.5 MG tablet TAKE 1 TABLET(0.5 MG) BY MOUTH TWICE DAILY AS NEEDED FOR ANXIETY  . aspirin EC 81 MG EC tablet Take 1 tablet (81 mg total) by mouth daily. Swallow whole.  Marland Kitchen BIOTIN PO Take 1 tablet by mouth daily.   . butalbital-acetaminophen-caffeine (FIORICET) 50-325-40 MG tablet Take 1 tablet by mouth every 8 (eight) hours as needed for headache.  . clopidogrel (PLAVIX) 75 MG  tablet Take 1 tablet (75 mg total) by mouth daily.  Marland Kitchen EPINEPHrine 0.3 mg/0.3 mL IJ SOAJ injection Inject 0.3 mLs (0.3 mg total) into the muscle once as needed for up to 1 dose for anaphylaxis.  Marland Kitchen loratadine (CLARITIN) 10 MG tablet Take 10 mg by mouth daily.  . pantoprazole (PROTONIX) 40 MG tablet TAKE 1 TABLET (40 MG TOTAL) BY MOUTH DAILY.     Allergies:   Peanut-containing drug products   Social History   Socioeconomic History  . Marital status: Married    Spouse name: Not on file  . Number of children: 2  . Years of education: Not on file  . Highest education level: Not on file  Occupational History  . Occupation: resp therapist    Employer: [employer]  Tobacco Use  . Smoking status: Never Smoker  . Smokeless tobacco: Never Used  Vaping Use  . Vaping Use: Never used  Substance and Sexual Activity  . Alcohol use: Yes    Comment: social  . Drug use: No  . Sexual activity: Yes    Birth control/protection: None  Other Topics Concern  . Not on file  Social History Narrative   Married, respiratory therapist [facility]   2 children   Occasional alcohol, never smoker no drug use   Previous work Personnel officer   Social Determinants of Radio broadcast assistant Strain:   . Difficulty of Paying Living Expenses: Not on file  Food Insecurity:   . Worried About Charity fundraiser in the Last Year: Not on file  . Ran Out of Food in the Last Year: Not on file  Transportation Needs:   . Lack of Transportation (Medical): Not on file  . Lack of Transportation (Non-Medical): Not on file  Physical Activity:   . Days of Exercise per Week: Not on file  . Minutes of Exercise per Session: Not on file  Stress:   . Feeling of Stress : Not on file  Social Connections:   . Frequency of Communication with Friends and Family: Not on file  . Frequency of Social Gatherings with Friends and Family: Not on file  . Attends Religious Services: Not on file  .  Active Member of Clubs or Organizations: Not on file  . Attends Archivist Meetings: Not on file  . Marital Status: Not on file     Family History: The patient's family history includes ADD / ADHD in her daughter; Anxiety disorder in her daughter; COPD in her maternal grandfather; Colon polyps in her paternal grandmother; Diabetes in her maternal grandmother and another family member; Heart disease in her paternal grandmother; Irritable bowel syndrome in her paternal grandmother; Lung cancer in her paternal grandfather. There is no history of Allergic rhinitis, Angioedema, Asthma, Atopy, Eczema, Immunodeficiency, or Urticaria.  ROS:   Please see the history of present illness.    All other systems reviewed and are negative.  EKGs/Labs/Other Studies Reviewed:    The following studies were reviewed today: TEE images reviewed. Report below: Impression: Secundum ASD with left to right flow, see dimensions above.  With CVA, she will need consideration of ASD closure.  Has appt with Dr. Burt Knack.    EKG:  EKG is ordered today.  The ekg ordered today demonstrates sinus rhythm 94 bpm, incomplete RBBB, otherwise normal  Recent Labs: 07/27/2020: ALT 13; BUN 12; Creatinine, Ser 0.72; Hemoglobin 14.1; Platelets 192; Potassium 3.6; Sodium 137  Recent Lipid Panel    Component Value Date/Time   CHOL 135 07/28/2020 0351   CHOL 146 10/14/2018 1026   TRIG 46 07/28/2020 0351   HDL 57 07/28/2020 0351   HDL 64 10/14/2018 1026   CHOLHDL 2.4 07/28/2020 0351   VLDL 9 07/28/2020 0351   LDLCALC 69 07/28/2020 0351   LDLCALC 69 10/14/2018 1026    Physical Exam:    VS:  BP 122/72   Pulse 94   Ht 5\' 4"  (1.626 m)   Wt 161 lb (73 kg)   LMP 08/01/2020   SpO2 99%   BMI 27.64 kg/m     Wt Readings from Last 3 Encounters:  08/21/20 161 lb (73 kg)  08/21/20 162 lb 12.8 oz (73.8 kg)  08/04/20 159 lb 9.6 oz (72.4 kg)     GEN:  Well nourished, well developed in no acute distress HEENT:  Normal NECK: No JVD; No carotid bruits LYMPHATICS: No lymphadenopathy CARDIAC: RRR, no murmurs, rubs, gallops RESPIRATORY:  Clear to auscultation without rales, wheezing or rhonchi  ABDOMEN: Soft, non-tender, non-distended MUSCULOSKELETAL:  No edema; No deformity  SKIN: Warm and dry NEUROLOGIC:  Alert and oriented x 3 PSYCHIATRIC:  Normal affect   ASSESSMENT:    1. ASD (atrial septal defect)    PLAN:    In order of problems listed above:  1. The patient has an ostium secundum ASD recently diagnosed after suffering a small embolic stroke.  She has no other stroke risk factors identified with a rope score of 8 suggesting high probability of a paradoxical embolus as the etiology of her stroke.  In addition, a Valsalva event initiated her stroke symptoms.  I have personally reviewed her TEE which shows a moderate sized secundum ASD with associated atrial septal aneurysm.  The ASD measures approximately 11 mm in diameter.  We discussed no clinical association of ASD/PFO with cryptogenic stroke in the patient understands this association.  I personally reviewed the transcatheter closure procedure with the patient and her husband today.  I demonstrated the device.  We discussed her questionable history of nickel allergy.  It sounds like she has tested positive for nickel allergy in the past but that she is really not had any clinical evidence of a true nickel allergy.  She is in the process of undergoing further nickel allergy testing.  She understands that the device is made of nitinol (nickel titanium) and we discussed the potential for a reaction.  We also reviewed standard procedural risks which include bleeding, infection, device embolization, atrial fibrillation or other arrhythmia, stroke, heart attack, cardiac perforation, cardiac tamponade, emergency surgery, and vascular injury.  She understands that any of these risks occur at a very low incidence of less than 1% with the exception of atrial  fibrillation which occurs in 2 to 2.5% of patients.  After full review of procedural risks, indications, and expected recovery, the patient consents to the procedure.  She understands that we will use intracardiac echo to measure the  defect and decide on device sizing.   Medication Adjustments/Labs and Tests Ordered: Current medicines are reviewed at length with the patient today.  Concerns regarding medicines are outlined above.  Orders Placed This Encounter  Procedures  . CBC with Differential/Platelet  . Basic metabolic panel  . EKG 12-Lead   No orders of the defined types were placed in this encounter.   Patient Instructions  COVID SCREENING INFORMATION (8/30): You are scheduled for your drive-thru COVID screening on 8/30 between 9AM and 2:30PM. Pre-Procedural COVID-19 Testing Site [address] [place], [place] [address] You will need to go home after your screening and quarantine until your procedure.  CLOSURE INSTRUCTIONS (9/1): You are scheduled for an ASD CLOSURE on Wednesday, September 1 with Dr. Sherren Mocha.  1. Please arrive at the [facility] (Main Entrance A) at [facility]: [address] [place], [place] [address] at 6:30 AM (This time is two hours before your procedure to ensure your preparation). Free valet parking service is available. You are allowed ONE visitor in the waiting room during your procedure. Both you and your guest must wear masks.  Special note: Every effort is made to have your procedure done on time. Please understand that emergencies sometimes delay scheduled procedures.  2. Diet: Stay well hydrated the days prior to your procedure! Do not eat solid foods after midnight. You may have clear liquids until 5am upon the day of the procedure.  3. Labs: TODAY! BMET, CBC  4. Medication instructions in preparation for your procedure:  1) Make sure to take your ASPIRIN and PLAVIX the morning of your procedure  2) You may take your other  medications as directed with sips of water  5. Plan for one night stay--bring personal belongings. 6. Bring a current list of your medications and current insurance cards. 7. You MUST have a responsible person to drive you home. 8. Someone MUST be with you the first 24 hours after you arrive home or your discharge will be delayed. 9. Please wear clothes that are easy to get on and off and wear slip-on shoes.  Thank you for allowing [place] to care for you!   -- [facility]  FOLLOW-UP APPOINTMENT: Your provider recommends that you schedule a follow-up appointment 1 month post procedure with Nell Range, PA.    Signed, Sherren Mocha, MD  08/21/2020 5:03 PM    [place]

## 2020-08-21 NOTE — Patient Instructions (Addendum)
Allergic contact dermatitis to metals - Instructions provided on care of the patches for the next 48 hours. Marissa Burnett was instructed to avoid showering for the next 48 hours. Marissa Burnett will follow up in 48 hours and 96 hours for patch readings.  Anaphylactic shock due to food Avoid peanuts. In case of an allergic reaction, give Benadryl 4 teaspoonfuls every 4 hours, and if life-threatening symptoms occur, inject with EpiPen 0.3 mg.  Seasonal and perennial allergic rhinitis ( molds, dust mites, cock roach) Continue Claritin 10 mg once a day as needed for runny nose  Contact dermatitis-likely neosporin Continue to avoid neosporin  Schedule follow up appointment this Wednesday and Friday

## 2020-08-22 LAB — BASIC METABOLIC PANEL
BUN/Creatinine Ratio: 18 (ref 9–23)
BUN: 11 mg/dL (ref 6–20)
CO2: 24 mmol/L (ref 20–29)
Calcium: 9.1 mg/dL (ref 8.7–10.2)
Chloride: 104 mmol/L (ref 96–106)
Creatinine, Ser: 0.6 mg/dL (ref 0.57–1.00)
GFR calc Af Amer: 138 mL/min/{1.73_m2} (ref >59)
GFR calc non Af Amer: 119 mL/min/{1.73_m2} (ref >59)
Glucose: 87 mg/dL (ref 65–99)
Potassium: 3.9 mmol/L (ref 3.5–5.2)
Sodium: 139 mmol/L (ref 134–144)

## 2020-08-22 LAB — CBC WITH DIFFERENTIAL/PLATELET
Basophils Absolute: 0.1 10*3/uL (ref 0.0–0.2)
Basos: 1 %
EOS (ABSOLUTE): 0.1 10*3/uL (ref 0.0–0.4)
Eos: 1 %
Hematocrit: 40.6 % (ref 34.0–46.6)
Hemoglobin: 13.8 g/dL (ref 11.1–15.9)
Immature Grans (Abs): 0 10*3/uL (ref 0.0–0.1)
Immature Granulocytes: 0 %
Lymphocytes Absolute: 2 10*3/uL (ref 0.7–3.1)
Lymphs: 29 %
MCH: 31 pg (ref 26.6–33.0)
MCHC: 34 g/dL (ref 31.5–35.7)
MCV: 91 fL (ref 79–97)
Monocytes Absolute: 0.5 10*3/uL (ref 0.1–0.9)
Monocytes: 7 %
Neutrophils Absolute: 4.2 10*3/uL (ref 1.4–7.0)
Neutrophils: 62 %
Platelets: 203 10*3/uL (ref 150–450)
RBC: 4.45 x10E6/uL (ref 3.77–5.28)
RDW: 11.8 % (ref 11.7–15.4)
WBC: 6.7 10*3/uL (ref 3.4–10.8)

## 2020-08-23 ENCOUNTER — Encounter: Payer: Self-pay | Admitting: Allergy

## 2020-08-23 ENCOUNTER — Other Ambulatory Visit: Payer: Self-pay | Admitting: Family Medicine

## 2020-08-23 ENCOUNTER — Ambulatory Visit: Payer: No Typology Code available for payment source | Admitting: Allergy

## 2020-08-23 ENCOUNTER — Other Ambulatory Visit: Payer: Self-pay

## 2020-08-23 DIAGNOSIS — L230 Allergic contact dermatitis due to metals: Secondary | ICD-10-CM | POA: Insufficient documentation

## 2020-08-23 DIAGNOSIS — L235 Allergic contact dermatitis due to other chemical products: Secondary | ICD-10-CM | POA: Insufficient documentation

## 2020-08-23 NOTE — Assessment & Plan Note (Signed)
   Negative first reading at 48 hours for metal patch test.

## 2020-08-23 NOTE — Progress Notes (Signed)
   Follow Up Note  RE: Marissa Burnett MRN: [id number] DOB: 30-Aug-1986 Date of Office Visit: 08/23/2020  Referring provider: [name], MD Primary care provider: [name], MD  History of Present Illness: I had the pleasure of seeing Marissa Burnett for a follow up visit at the [facility] [facility] [place] on 08/23/2020. She is a 34 y.o. female, who is being followed for contact dermatitis, food allergy and allergic rhinitis. Today she is here for initial patch test interpretation, given suspected history of contact dermatitis.   Diagnostics:  Metal patch test 48 hour reading:  Negative to all.   Metals Patch - 08/23/20 1000    Time Antigen Placed 1136    Manufacturer Other   [employer]   Location Back    Number of Test 11    Reading Interval Day 3    Aluminum Hydroxide 10% 0    Chromium chloride 1% 0    Cobalt chloride hexahydrate 1% 0    Molybdenum chloride 0.5% 0    Nickel sulfate hexahydrate 5% 0    Potassium dichromate 0.25% 0    Copper sulfate pentahydrate 2% 0    Tantal 1% 0    Titanium 0.1% 0    Manganese chloride 0.5% 0    Vanadium Pentoxide 10% 0            Assessment and Plan: Marissa Burnett is a 34 y.o. female with: Contact dermatitis due to metals  Negative first reading at 48 hours for metal patch test.  Return in about 2 days (around 08/25/2020) for Patch reading.  It was my pleasure to see Marissa Burnett today and participate in her care. Please feel free to contact me with any questions or concerns.  Sincerely,  Rexene Alberts, DO Allergy & Immunology  [facility] office: [phone] [facility] office: [place] office: [phone]

## 2020-08-24 ENCOUNTER — Ambulatory Visit: Payer: Self-pay | Admitting: Neurology

## 2020-08-24 ENCOUNTER — Telehealth: Payer: Self-pay | Admitting: Family Medicine

## 2020-08-24 NOTE — Telephone Encounter (Signed)
Lmtc. Per pharmacy they do have script ready for pt.

## 2020-08-24 NOTE — Telephone Encounter (Signed)
Pt needs refills on ALPRAZolam (XANAX) 0.5 MG tablet  Pharmacy said it was denied. Pt is having to take daily until her surgery next week she has two pills left. She has talked to Dr Nicki Reaper about this and he informed her that it was ok to take them daily by My Chart   Pt call back [phone]

## 2020-08-25 ENCOUNTER — Other Ambulatory Visit: Payer: Self-pay | Admitting: Cardiovascular Disease

## 2020-08-25 ENCOUNTER — Other Ambulatory Visit: Payer: Self-pay

## 2020-08-25 ENCOUNTER — Encounter: Payer: Self-pay | Admitting: Family Medicine

## 2020-08-25 ENCOUNTER — Ambulatory Visit: Payer: No Typology Code available for payment source | Admitting: Family Medicine

## 2020-08-25 DIAGNOSIS — L230 Allergic contact dermatitis due to metals: Secondary | ICD-10-CM

## 2020-08-25 MED FILL — CLOPIDOGREL 75 MG TABLET: 75 | 30 days supply | Qty: 30 | Fill #0

## 2020-08-25 MED FILL — ASPIRIN LOW DOSE 81 MG TBEC: 81 | 30 days supply | Qty: 30 | Fill #1

## 2020-08-25 NOTE — Progress Notes (Addendum)
° ° °  Follow-up Note  RE: Marissa Burnett MRN: [id number] DOB: 02-18-1986 Date of Office Visit: 08/25/2020  Primary care provider: [name], MD Referring provider: [name], MD   [name] returns to the office today for the final patch test interpretation, given suspected history of contact dermatitis.    Diagnostics:  Metals testing 96-hour hour reading: Negative to chromium chloride 1%, potassium dichromate 0.25%, cobalt chloride hexahydrate 1%, copper sulfate pentahydrate 2%, molybdenum chloride 0.5%, titanium 0.1%, tantal, magnese chloride 0.5%, nickel sulfate hexahydrate 5%, aluminum hydroxide 10%, and vanadium pentoxide 10%.  Plan:   Allergic contact dermatitis You are not sensitive to any of the metals that we tested.  These metals are listed in the paragraph above. If you notice any red itchy skin in the areas that we tested please give the clinic a call.  Call the clinic if this treatment plan is not working well for you  Follow up as needed.  Thank you for the opportunity to care for this patient.  Please do not hesitate to contact me with questions.  Gareth Morgan, FNP [facility]  ------------------------------------ Addendum: I reviewed the Nurse Practitioner's note and agree with the documented findings and plan of care. We discussed the patient and developed a plan concurrently.   Prudy Feeler, MD [facility] [place] [facility] [place]

## 2020-08-25 NOTE — Patient Instructions (Signed)
Metals test 96-hour hour reading: Metals testing 96-hour hour reading: Negative to chromium chloride 1%, potassium dichromate 0.25%, cobalt chloride hexahydrate 1%, copper sulfate pentahydrate 2%, molybdenum chloride 0.5%, titanium 0.1%, tantal, magnese chloride 0.5%, nickel sulfate hexahydrate 5%, aluminum hydroxide 10%, and vanadium pentoxide 10%.  Plan:   Allergic contact dermatitis You are not sensitive to any of the metals that we tested.  These metals are listed in the paragraph above. If you notice any red itchy skin in the areas that we tested please give the clinic a call.  Call the clinic if this treatment plan is not working well for you  Follow up as needed.

## 2020-08-25 NOTE — Interval H&P Note (Signed)
History and Physical Interval Note:  08/25/2020 1:18 PM  Marissa Burnett  has presented today for surgery, with the diagnosis of TIA.  The various methods of treatment have been discussed with the patient and family. After consideration of risks, benefits and other options for treatment, the patient has consented to  Procedure(s): TRANSESOPHAGEAL ECHOCARDIOGRAM (TEE) (N/A) as a surgical intervention.  The patient's history has been reviewed, patient examined, no change in status, stable for surgery.  I have reviewed the patient's chart and labs.  Questions were answered to the patient's satisfaction.     Marianne Golightly [employer]

## 2020-08-28 ENCOUNTER — Other Ambulatory Visit: Payer: Self-pay

## 2020-08-28 ENCOUNTER — Telehealth: Payer: Self-pay | Admitting: *Deleted

## 2020-08-28 ENCOUNTER — Ambulatory Visit (INDEPENDENT_AMBULATORY_CARE_PROVIDER_SITE_OTHER): Payer: No Typology Code available for payment source | Admitting: Family Medicine

## 2020-08-28 ENCOUNTER — Encounter: Payer: Self-pay | Admitting: Family Medicine

## 2020-08-28 VITALS — BP 132/88 | HR 132 | Temp 97.2°F | Wt 162.0 lb

## 2020-08-28 DIAGNOSIS — R109 Unspecified abdominal pain: Secondary | ICD-10-CM | POA: Diagnosis not present

## 2020-08-28 LAB — POCT URINALYSIS DIPSTICK (MANUAL)
Leukocytes, UA: NEGATIVE
Nitrite, UA: NEGATIVE
Poct Bilirubin: NEGATIVE
Poct Blood: NEGATIVE
Poct Glucose: NORMAL mg/dL
Poct Ketones: NEGATIVE
Poct Protein: NEGATIVE mg/dL
Spec Grav, UA: 1.02 (ref 1.010–1.025)
pH, UA: 5.5 (ref 5.0–8.0)

## 2020-08-28 NOTE — Telephone Encounter (Signed)
Pt contacted pre-ASD closure scheduled at [facility] for:  Wednesday August 30, 2020 8:30 AM Verified arrival time and place: [facility]) at: 6:30 AM   No solid food after midnight prior to cath, clear liquids until 5 AM day of procedure.  AM meds can be  taken pre-cath with sips of water including: ASA 81 mg Plavix 75 mg  Confirmed patient has responsible adult to drive home post procedure and observe 24 hours after arriving home: yes  You are allowed ONE visitor in the waiting room during the time you are at the hospital for your procedure. Both you and your visitor must wear a mask once you enter the hospital.       COVID-19 Pre-Screening Questions:   In the past 10 days have you had a new cough, shortness of breath, headache, congestion, fever (100 or greater) unexplained body aches, new sore throat, or sudden loss of taste or sense of smell? no  In the past 10 days have you been around anyone with known Covid 19? no  Have you been vaccinated for COVID-19? no  Reviewed procedure/mask/visitor instructions, COVID-19 questions with patient.

## 2020-08-28 NOTE — Progress Notes (Signed)
   Subjective:    Patient ID: Marissa Burnett, female    DOB: 1986/09/03, 34 y.o.   MRN: [id number]  HPI very nice patient Patient complains of life sided pain that comes and goes 2-3 x per day. Describes pain as a cramping feeling. This started shortly after starting Plavix.  Sharp pain on the left side.  Hurts with sometimes deep breath sometimes with movement other times it is just there for no apparent reason will last several minutes at a time before it goes away no wheezing or difficulty breathing no vomiting or diarrhea  Review of Systems Please see above    Objective:   Physical Exam  Lungs are clear heart is regular her side no sign of any rash or shingles.  No mass.  Abdominal exam normal.  Nurse present during exam. Her urine is negative for any RBCs WBCs no sign of infection     Assessment & Plan:  I believe that this is musculoskeletal pain.  I doubt pulmonary embolism.  I doubt any type of spleen issue or tumor.  I do not recommend an x-ray currently.  Certainly if symptoms persist or worsen then further work-up would be warranted.  Patient will keep [place] updated  She has upcoming surgical for her atrium defect.

## 2020-08-28 NOTE — H&P (Signed)
Advanced Heart Failure Team History and Physical Note   PCP:  [name], MD  PCP-Cardiology: No primary care provider on file.     Reason for Admission: TIA   HPI:    Patient with history of TIA here for TEE to assess source of embolus.     Review of Systems: [y] = yes, [ ]  = no   General: Weight gain [ ] ; Weight loss [ ] ; Anorexia [ ] ; Fatigue [ ] ; Fever [ ] ; Chills [ ] ; Weakness [ ]   Cardiac: Chest pain/pressure [ ] ; Resting SOB [ ] ; Exertional SOB [ ] ; Orthopnea [ ] ; Pedal Edema [ ] ; Palpitations [ ] ; Syncope [ ] ; Presyncope [ ] ; Paroxysmal nocturnal dyspnea[ ]   Pulmonary: Cough [ ] ; Wheezing[ ] ; Hemoptysis[ ] ; Sputum [ ] ; Snoring [ ]   GI: Vomiting[ ] ; Dysphagia[ ] ; Melena[ ] ; Hematochezia [ ] ; Heartburn[ ] ; Abdominal pain [ ] ; Constipation [ ] ; Diarrhea [ ] ; BRBPR [ ]   GU: Hematuria[ ] ; Dysuria [ ] ; Nocturia[ ]   Vascular: Pain in legs with walking [ ] ; Pain in feet with lying flat [ ] ; Non-healing sores [ ] ; Stroke [ ] ; TIA [ ] ; Slurred speech [ ] ;  Neuro: Headaches[ ] ; Vertigo[ ] ; Seizures[ ] ; Paresthesias[ ] ;Blurred vision [ ] ; Diplopia [ ] ; Vision changes [ ]   Ortho/Skin: Arthritis [ ] ; Joint pain [ ] ; Muscle pain [ ] ; Joint swelling [ ] ; Back Pain [ ] ; Rash [ ]   Psych: Depression[ ] ; Anxiety[ ]   Heme: Bleeding problems [ ] ; Clotting disorders [ ] ; Anemia [ ]   Endocrine: Diabetes [ ] ; Thyroid dysfunction[ ]    Home Medications Prior to Admission medications   Medication Sig Start Date End Date Taking? Authorizing Provider  acetaminophen (TYLENOL) 500 MG tablet Take 1,000 mg by mouth daily as needed for headache.    Yes [provider]  aspirin EC 81 MG EC tablet Take 1 tablet (81 mg total) by mouth daily. Swallow whole. 07/29/20  Yes Little Ishikawa, MD  loratadine (CLARITIN) 10 MG tablet Take 10 mg by mouth daily.   Yes [provider]  pantoprazole (PROTONIX) 40 MG tablet TAKE 1 TABLET (40 MG TOTAL) BY MOUTH DAILY. 06/01/20  Yes Luking, Elayne Snare, MD  ALPRAZolam (XANAX) 0.5 MG tablet TAKE 1 TABLET(0.5 MG) BY MOUTH TWICE DAILY AS NEEDED FOR ANXIETY Patient taking differently: Take 0.25-0.5 mg by mouth 2 (two) times daily as needed for anxiety.  08/23/20   [name], MD  BIOTIN PO Take 1 tablet by mouth daily.     [provider]  clopidogrel (PLAVIX) 75 MG tablet TAKE 1 TABLET (75 MG TOTAL) BY MOUTH DAILY. 08/25/20 09/24/20  Sherren Mocha, MD  EPINEPHrine 0.3 mg/0.3 mL IJ SOAJ injection Inject 0.3 mLs (0.3 mg total) into the muscle once as needed for up to 1 dose for anaphylaxis. 08/21/20   Althea Charon, FNP  Multiple Vitamins-Minerals (EMERGEN-C VITAMIN C) PACK Take 1 packet by mouth daily.    [provider]  Polyvinyl Alcohol-Povidone (REFRESH OP) Place 1 drop into both eyes daily as needed (dry eyes).    [provider]    Past Medical History: Past Medical History:  Diagnosis Date  . Abnormal Papanicolaou smear of cervix 10/15/2016   LSIL+HPV  . Allergy   . Anxiety   . ASD (atrial septal defect) 07/28/2020  . Chronic lumbar pain 09/27/2018   Patient has seen Dr. Arnoldo Morale neurosurgery September 2019-they do not feel there is anything surgical  that can help-they are recommending injections with pain medicine specialist  . Dysrhythmia    history of palpitations   . Gallstones   . GERD (gastroesophageal reflux disease)   . Heart murmur birth  . Stroke [facility]) 07/12/2020    Past Surgical History: Past Surgical History:  Procedure Laterality Date  . ABDOMINOPLASTY    . BREAST ENHANCEMENT SURGERY    . CERVICAL CONIZATION W/BX N/A 12/13/2016   Procedure: CONIZATION CERVIX WITH BIOPSY--cold knife conization;  Surgeon: Jonnie Kind, MD;  Location: [facility];  Service: Gynecology;  Laterality: N/A;  . CHOLECYSTECTOMY  02/2002  . ENDOSCOPIC RETROGRADE CHOLANGIOPANCREATOGRAPHY (ERCP) WITH PROPOFOL    . OTHER SURGICAL HISTORY     stent in kidney during pregnancy,2002; removed in 2003  . TEE WITHOUT  CARDIOVERSION N/A 08/04/2020   Procedure: TRANSESOPHAGEAL ECHOCARDIOGRAM (TEE);  Surgeon: Larey Dresser, MD;  Location: [facility] ENDOSCOPY;  Service: Cardiovascular;  Laterality: N/A;    Family History:  Family History  Problem Relation Age of Onset  . Diabetes Other   . Lung cancer Paternal Grandfather   . Diabetes Maternal Grandmother   . COPD Maternal Grandfather   . Heart disease Paternal Grandmother   . Colon polyps Paternal Grandmother   . Irritable bowel syndrome Paternal Grandmother   . ADD / ADHD Daughter   . Anxiety disorder Daughter   . Allergic rhinitis Neg Hx   . Angioedema Neg Hx   . Asthma Neg Hx   . Atopy Neg Hx   . Eczema Neg Hx   . Immunodeficiency Neg Hx   . Urticaria Neg Hx     Social History: Social History   Socioeconomic History  . Marital status: Married    Spouse name: Not on file  . Number of children: 2  . Years of education: Not on file  . Highest education level: Not on file  Occupational History  . Occupation: resp therapist    Employer: [employer]  Tobacco Use  . Smoking status: Never Smoker  . Smokeless tobacco: Never Used  Vaping Use  . Vaping Use: Never used  Substance and Sexual Activity  . Alcohol use: Yes    Comment: social  . Drug use: No  . Sexual activity: Yes    Birth control/protection: None  Other Topics Concern  . Not on file  Social History Narrative   Married, respiratory therapist [facility]   2 children   Occasional alcohol, never smoker no drug use   Previous work Personnel officer   Social Determinants of Radio broadcast assistant Strain:   . Difficulty of Paying Living Expenses: Not on file  Food Insecurity:   . Worried About Charity fundraiser in the Last Year: Not on file  . Ran Out of Food in the Last Year: Not on file  Transportation Needs:   . Lack of Transportation (Medical): Not on file  . Lack of Transportation (Non-Medical): Not on file  Physical Activity:   . Days  of Exercise per Week: Not on file  . Minutes of Exercise per Session: Not on file  Stress:   . Feeling of Stress : Not on file  Social Connections:   . Frequency of Communication with Friends and Family: Not on file  . Frequency of Social Gatherings with Friends and Family: Not on file  . Attends Religious Services: Not on file  . Active Member of Clubs or Organizations: Not on file  . Attends Archivist  Meetings: Not on file  . Marital Status: Not on file    Allergies:  Allergies  Allergen Reactions  . Peanut-Containing Drug Products Anaphylaxis    THROAT GETS TIGHT, peanut butter    Objective:    Vital Signs:       Filed Weights   08/04/20 0720  Weight: 72.4 kg     Physical Exam     General:  Well appearing. No respiratory difficulty HEENT: Normal Neck: Supple. no JVD. Carotids 2+ bilat; no bruits. No lymphadenopathy or thyromegaly appreciated. Cor: PMI nondisplaced. Regular rate & rhythm. No rubs, gallops or murmurs. Lungs: Clear Abdomen: Soft, nontender, nondistended. No hepatosplenomegaly. No bruits or masses. Good bowel sounds. Extremities: No cyanosis, clubbing, rash, edema Neuro: Alert & oriented x 3, cranial nerves grossly intact. moves all 4 extremities w/o difficulty. Affect pleasant.   Labs     Basic Metabolic Panel: Recent Labs  Lab 08/21/20 1549  NA 139  K 3.9  CL 104  CO2 24  GLUCOSE 87  BUN 11  CREATININE 0.60  CALCIUM 9.1    Liver Function Tests: No results for input(s): AST, ALT, ALKPHOS, BILITOT, PROT, ALBUMIN in the last 168 hours. No results for input(s): LIPASE, AMYLASE in the last 168 hours. No results for input(s): AMMONIA in the last 168 hours.  CBC: Recent Labs  Lab 08/21/20 1549  WBC 6.7  NEUTROABS 4.2  HGB 13.8  HCT 40.6  MCV 91  PLT 203    Cardiac Enzymes: No results for input(s): CKTOTAL, CKMB, CKMBINDEX, TROPONINI in the last 168 hours.  BNP: BNP (last 3 results) No results for input(s): BNP in  the last 8760 hours.  ProBNP (last 3 results) No results for input(s): PROBNP in the last 8760 hours.   CBG: No results for input(s): GLUCAP in the last 168 hours.  Coagulation Studies: No results for input(s): LABPROT, INR in the last 72 hours.  Imaging:  No results found.   Assessment/Plan   Patient with history of TIA, no new neurological symptoms, will have TEE today to assess source of embous.    Loralie Champagne, MD  Advanced Heart Failure Team Pager [phone] (M-F; 7a - 4p)  Please contact [facility] for night-coverage after hours (4p -7a ) and weekends on [email or web address]

## 2020-08-29 ENCOUNTER — Telehealth: Payer: Self-pay | Admitting: Cardiovascular Disease

## 2020-08-29 ENCOUNTER — Other Ambulatory Visit (HOSPITAL_COMMUNITY)
Admission: RE | Admit: 2020-08-29 | Discharge: 2020-08-29 | Disposition: A | Discharge status: Home / Self Care | Payer: No Typology Code available for payment source | Source: Ambulatory Visit | Attending: Cardiovascular Disease | Admitting: Cardiovascular Disease

## 2020-08-29 DIAGNOSIS — Z01812 Encounter for preprocedural laboratory examination: Secondary | ICD-10-CM | POA: Insufficient documentation

## 2020-08-29 DIAGNOSIS — Z20822 Contact with and (suspected) exposure to covid-19: Secondary | ICD-10-CM | POA: Diagnosis not present

## 2020-08-29 LAB — SARS CORONAVIRUS 2 (TAT 6-24 HRS): SARS Coronavirus 2: NEGATIVE

## 2020-08-29 NOTE — Telephone Encounter (Signed)
Patient states she was told she would get a call from the cath lab to confirm her catheterization, but she has not received a call from them. She would like to know if she is still having her catheterization tomorrow.

## 2020-08-29 NOTE — Telephone Encounter (Addendum)
Spoke with patient, ASD closure scheduled 08/30/20 arrive 6:30 AM for 8:30 AM procedure, instructions reviewed with patient 08/28/20.

## 2020-08-30 ENCOUNTER — Ambulatory Visit (HOSPITAL_BASED_OUTPATIENT_CLINIC_OR_DEPARTMENT_OTHER): Payer: No Typology Code available for payment source

## 2020-08-30 ENCOUNTER — Other Ambulatory Visit: Payer: Self-pay

## 2020-08-30 ENCOUNTER — Ambulatory Visit (HOSPITAL_COMMUNITY)
Admission: RE | Admit: 2020-08-30 | Discharge: 2020-08-30 | Disposition: A | Discharge status: Home / Self Care | Payer: No Typology Code available for payment source | Attending: Cardiovascular Disease | Admitting: Cardiovascular Disease

## 2020-08-30 ENCOUNTER — Encounter (HOSPITAL_COMMUNITY)
Admission: RE | Disposition: A | Discharge status: Home / Self Care | Payer: No Typology Code available for payment source | Source: Home / Self Care | Attending: Cardiovascular Disease

## 2020-08-30 DIAGNOSIS — K219 Gastro-esophageal reflux disease without esophagitis: Secondary | ICD-10-CM | POA: Insufficient documentation

## 2020-08-30 DIAGNOSIS — Q211 Atrial septal defect, unspecified: Secondary | ICD-10-CM

## 2020-08-30 DIAGNOSIS — Z7902 Long term (current) use of antithrombotics/antiplatelets: Secondary | ICD-10-CM | POA: Diagnosis not present

## 2020-08-30 DIAGNOSIS — Z8673 Personal history of transient ischemic attack (TIA), and cerebral infarction without residual deficits: Secondary | ICD-10-CM | POA: Insufficient documentation

## 2020-08-30 DIAGNOSIS — F419 Anxiety disorder, unspecified: Secondary | ICD-10-CM | POA: Insufficient documentation

## 2020-08-30 DIAGNOSIS — Z79899 Other long term (current) drug therapy: Secondary | ICD-10-CM | POA: Diagnosis not present

## 2020-08-30 DIAGNOSIS — Z7982 Long term (current) use of aspirin: Secondary | ICD-10-CM | POA: Diagnosis not present

## 2020-08-30 HISTORY — PX: ATRIAL SEPTAL DEFECT(ASD) CLOSURE: CATH118299

## 2020-08-30 LAB — POCT ACTIVATED CLOTTING TIME
Activated Clotting Time: 213 seconds
Activated Clotting Time: 219 seconds
Activated Clotting Time: 230 seconds
Activated Clotting Time: 180 seconds

## 2020-08-30 LAB — ECHOCARDIOGRAM LIMITED
Height: 64 in
S' Lateral: 3.5 cm
Weight: 2534.4 oz

## 2020-08-30 LAB — PREGNANCY, URINE: Preg Test, Ur: NEGATIVE

## 2020-08-30 SURGERY — ATRIAL SEPTAL DEFECT (ASD) CLOSURE
Anesthesia: LOCAL

## 2020-08-30 MED ORDER — SODIUM CHLORIDE 0.9% FLUSH: 3.0000 mL | INTRAVENOUS | Status: DC | PRN

## 2020-08-30 MED ORDER — FENTANYL CITRATE (PF) 100 MCG/2ML IJ SOLN
INTRAMUSCULAR | Status: DC | PRN
Start: 2020-08-30 — End: 2020-08-30
  Administered 2020-08-30: 50 ug via INTRAVENOUS
  Administered 2020-08-30: 25 ug via INTRAVENOUS
  Administered 2020-08-30: 50 ug via INTRAVENOUS
  Administered 2020-08-30: 25 ug via INTRAVENOUS

## 2020-08-30 MED ORDER — CLOPIDOGREL BISULFATE 75 MG PO TABS: 75.0000 mg | ORAL_TABLET | ORAL | Status: DC

## 2020-08-30 MED ORDER — OXYCODONE HCL 5 MG PO TABS: 5.0000 mg | ORAL_TABLET | ORAL | Status: DC | PRN

## 2020-08-30 MED ORDER — SODIUM CHLORIDE 0.9% FLUSH: 3.0000 mL | Freq: Two times a day (BID) | INTRAVENOUS | Status: DC

## 2020-08-30 MED ORDER — MORPHINE SULFATE (PF) 2 MG/ML IV SOLN: 2.0000 mg | INTRAVENOUS | Status: DC | PRN

## 2020-08-30 MED ORDER — MIDAZOLAM HCL 2 MG/2ML IJ SOLN
INTRAMUSCULAR | Status: DC | PRN
  Administered 2020-08-30 (×2): 1 mg via INTRAVENOUS
  Administered 2020-08-30 (×2): 2 mg via INTRAVENOUS

## 2020-08-30 MED ORDER — DIAZEPAM 5 MG PO TABS: 5.0000 mg | ORAL_TABLET | ORAL | Status: DC | PRN

## 2020-08-30 MED ORDER — HEPARIN SODIUM (PORCINE) 1000 UNIT/ML IJ SOLN
INTRAMUSCULAR | Status: AC
  Filled 2020-08-30: qty 1

## 2020-08-30 MED ORDER — HEPARIN (PORCINE) IN NACL 1000-0.9 UT/500ML-% IV SOLN
INTRAVENOUS | Status: AC
  Filled 2020-08-30: qty 1000

## 2020-08-30 MED ORDER — HYDRALAZINE HCL 20 MG/ML IJ SOLN: 10.0000 mg | INTRAMUSCULAR | Status: DC | PRN

## 2020-08-30 MED ORDER — MIDAZOLAM HCL 2 MG/2ML IJ SOLN
INTRAMUSCULAR | Status: AC
  Filled 2020-08-30: qty 2

## 2020-08-30 MED ORDER — FENTANYL CITRATE (PF) 100 MCG/2ML IJ SOLN
INTRAMUSCULAR | Status: AC
  Filled 2020-08-30: qty 2

## 2020-08-30 MED ORDER — SODIUM CHLORIDE 0.9 % IV SOLN: 250.0000 mL | INTRAVENOUS | Status: DC | PRN

## 2020-08-30 MED ORDER — SODIUM CHLORIDE 0.9 % WEIGHT BASED INFUSION: 3.0000 mL/kg/h | INTRAVENOUS | Status: AC

## 2020-08-30 MED ORDER — ACETAMINOPHEN 325 MG PO TABS: 650.0000 mg | ORAL_TABLET | ORAL | Status: DC | PRN

## 2020-08-30 MED ORDER — CEFAZOLIN SODIUM-DEXTROSE 2-4 GM/100ML-% IV SOLN
2.0000 g | INTRAVENOUS | Status: AC
  Administered 2020-08-30: 2 g via INTRAVENOUS

## 2020-08-30 MED ORDER — LABETALOL HCL 5 MG/ML IV SOLN: 10.0000 mg | INTRAVENOUS | Status: DC | PRN

## 2020-08-30 MED ORDER — SODIUM CHLORIDE 0.9 % WEIGHT BASED INFUSION: 1.0000 mL/kg/h | INTRAVENOUS | Status: DC

## 2020-08-30 MED ORDER — HEPARIN (PORCINE) IN NACL 1000-0.9 UT/500ML-% IV SOLN
INTRAVENOUS | Status: DC | PRN
  Administered 2020-08-30: 500 mL

## 2020-08-30 MED ORDER — CEFAZOLIN SODIUM-DEXTROSE 2-4 GM/100ML-% IV SOLN
INTRAVENOUS | Status: AC
  Filled 2020-08-30: qty 100

## 2020-08-30 MED ORDER — ONDANSETRON HCL 4 MG/2ML IJ SOLN: 4.0000 mg | Freq: Four times a day (QID) | INTRAMUSCULAR | Status: DC | PRN

## 2020-08-30 MED ORDER — LIDOCAINE HCL (PF) 1 % IJ SOLN
INTRAMUSCULAR | Status: AC
  Filled 2020-08-30: qty 30

## 2020-08-30 MED ORDER — HEPARIN SODIUM (PORCINE) 1000 UNIT/ML IJ SOLN
INTRAMUSCULAR | Status: DC | PRN
  Administered 2020-08-30: 2000 [IU] via INTRAVENOUS
  Administered 2020-08-30: 5000 [IU] via INTRAVENOUS
  Administered 2020-08-30: 2000 [IU] via INTRAVENOUS

## 2020-08-30 MED ORDER — ASPIRIN 81 MG PO CHEW: 81.0000 mg | CHEWABLE_TABLET | ORAL | Status: DC

## 2020-08-30 MED ORDER — LIDOCAINE HCL (PF) 1 % IJ SOLN
INTRAMUSCULAR | Status: DC | PRN
  Administered 2020-08-30: 15 mL

## 2020-08-30 SURGICAL SUPPLY — 16 items
BALLN SIZING AMPLATZER 24 (BALLOONS) ×2
BALLOON SIZING AMPLATZER 24 (BALLOONS) IMPLANT
BALN SZ 70X4.5 7FR 3 LUM (BALLOONS) ×1
CATH ACUNAV 8FR 90CM (CATHETERS) ×1 IMPLANT
CATH INFINITI 6F MPA2 100CM (CATHETERS) ×1 IMPLANT
OCCLUDER AMPLATZER SEPTAL 18MM (Prosthesis & Implant Heart) ×1 IMPLANT
PACK CARDIAC CATHETERIZATION (CUSTOM PROCEDURE TRAY) ×2 IMPLANT
PROTECTION STATION PRESSURIZED (MISCELLANEOUS) ×2
SHEATH INTROD W/O MIN 9FR 25CM (SHEATH) ×1 IMPLANT
SHEATH PINNACLE 8F 10CM (SHEATH) ×1 IMPLANT
SHEATH PINNACLE 9F 10CM (SHEATH) ×1 IMPLANT
STATION PROTECTION PRESSURIZED (MISCELLANEOUS) IMPLANT
SYS DELIVER AMP TREVISIO 9FR (SHEATH) ×2
SYSTEM DELIVER AMP TREVIS 9FR (SHEATH) IMPLANT
WIRE EMERALD 3MM-J .035X150CM (WIRE) ×1 IMPLANT
WIRE G VAS 035X260 STIFF (WIRE) ×1 IMPLANT

## 2020-08-30 NOTE — Interval H&P Note (Signed)
History and Physical Interval Note:  08/30/2020 8:14 AM  Marissa Burnett  has presented today for surgery, with the diagnosis of asd.  The various methods of treatment have been discussed with the patient and family. After consideration of risks, benefits and other options for treatment, the patient has consented to  Procedure(s): ATRIAL SEPTAL DEFECT (ASD) CLOSURE (N/A) as a surgical intervention.  The patient's history has been reviewed, patient examined, no change in status, stable for surgery.  I have reviewed the patient's chart and labs.  Questions were answered to the patient's satisfaction.     Sherren Mocha

## 2020-08-30 NOTE — Discharge Instructions (Signed)
Femoral Site Care This sheet gives you information about how to care for yourself after your procedure. Your health care provider may also give you more specific instructions. If you have problems or questions, contact your health care provider. What can I expect after the procedure? After the procedure, it is common to have:  Bruising that usually fades within 1-2 weeks.  Tenderness at the site. Follow these instructions at home: Wound care  Follow instructions from your health care provider about how to take care of your insertion site. Make sure you: ? Wash your hands with soap and water before you change your bandage (dressing). If soap and water are not available, use hand sanitizer. ? Change your dressing as told by your health care provider. ? Leave stitches (sutures), skin glue, or adhesive strips in place. These skin closures may need to stay in place for 2 weeks or longer. If adhesive strip edges start to loosen and curl up, you may trim the loose edges. Do not remove adhesive strips completely unless your health care provider tells you to do that.  Do not take baths, swim, or use a hot tub until your health care provider approves.  You may shower 24-48 hours after the procedure or as told by your health care provider. ? Gently wash the site with plain soap and water. ? Pat the area dry with a clean towel. ? Do not rub the site. This may cause bleeding.  Do not apply powder or lotion to the site. Keep the site clean and dry.  Check your femoral site every day for signs of infection. Check for: ? Redness, swelling, or pain. ? Fluid or blood. ? Warmth. ? Pus or a bad smell. Activity  For the first 2-3 days after your procedure, or as long as directed: ? Avoid climbing stairs as much as possible. ? Do not squat.  Do not lift anything that is heavier than 10 lb (4.5 kg), or the limit that you are told, until your health care provider says that it is safe.  Rest as  directed. ? Avoid sitting for a long time without moving. Get up to take short walks every 1-2 hours.  Do not drive for 24 hours if you were given a medicine to help you relax (sedative). General instructions  Take over-the-counter and prescription medicines only as told by your health care provider.  Keep all follow-up visits as told by your health care provider. This is important. Contact a health care provider if you have:  A fever or chills.  You have redness, swelling, or pain around your insertion site. Get help right away if:  The catheter insertion area swells very fast.  You pass out.  You suddenly start to sweat or your skin gets clammy.  The catheter insertion area is bleeding, and the bleeding does not stop when you hold steady pressure on the area.  The area near or just beyond the catheter insertion site becomes pale, cool, tingly, or numb. These symptoms may represent a serious problem that is an emergency. Do not wait to see if the symptoms will go away. Get medical help right away. Call your local emergency services (911 in the [place] Do not drive yourself to the hospital. Summary  After the procedure, it is common to have bruising that usually fades within 1-2 weeks.  Check your femoral site every day for signs of infection.  Do not lift anything that is heavier than 10 lb (4.5 kg), or the   limit that you are told, until your health care provider says that it is safe. This information is not intended to replace advice given to you by your health care provider. Make sure you discuss any questions you have with your health care provider. Document Revised: 12/29/2017 Document Reviewed: 12/29/2017 Elsevier Patient Education  2020 Elsevier Inc.  

## 2020-08-30 NOTE — Progress Notes (Signed)
Site Area: right femoral  Site Prior to Removal: Level 0 Pressure Applied For: 15  minutes Manual: yes Patient Status During Pull: stable Post Pull Site: Level 0 Post Pull Instructions Given: YES Post Pull Pulses Present: yes Dressing Applied: gauze and tegaderm Bedrest begins @ 1055 for 4 hours till 1455 Comments:  Removed by  Verta Ellen, RN, RCIS

## 2020-08-30 NOTE — Progress Notes (Signed)
  Echocardiogram 2D Echocardiogram has been performed.  Marissa Burnett 08/30/2020, 11:51 AM

## 2020-08-31 ENCOUNTER — Encounter (HOSPITAL_COMMUNITY): Payer: Self-pay | Admitting: Cardiovascular Disease

## 2020-09-05 ENCOUNTER — Telehealth: Payer: Self-pay | Admitting: Cardiovascular Disease

## 2020-09-05 NOTE — Telephone Encounter (Signed)
1. What dental office are you calling from?  Dr. Glad[facility]   2. What is your office phone number? [phone]   3. What is your fax number? [phone]  4. What type of procedure is the patient having performed? Dental cleaning   5. What date is procedure scheduled or is the patient there now? TBD- Some time in October.     6. What is your question (ex. Antibiotics prior to procedure, holding medication-we need to know how long dentist wants pt to hold med)? Dr. Gladstone Pih office is calling to discuss whether or not the patient will need to take an antibiotic prior to having dental work. If so, they are requesting to have documentation faxed to their office in order to update the patient's chart in their system.

## 2020-09-05 NOTE — Telephone Encounter (Signed)
Yes needs SBE prophylaxis x 6 months

## 2020-09-05 NOTE — Telephone Encounter (Signed)
Dr Burt Knack this patient just had an ASD closure- she needs dental cleaning- does she needs SBE prophylaxis?  Rexford Prevo PA-C 09/05/2020 11:26 AM  [facility] PRE OP

## 2020-09-05 NOTE — Telephone Encounter (Signed)
Nell Range personally spoke with the patient. She rescheduled follow-up appointment a couple weeks earlier and will plan for 3 month echo.

## 2020-09-05 NOTE — Telephone Encounter (Signed)
Patient is calling to see if a follow up echo is going to be ordered post ASD closure. If not, she would like to know if one could be ordered just for peace of mind. Please advise.

## 2020-09-06 ENCOUNTER — Other Ambulatory Visit: Payer: Self-pay | Admitting: Cardiology

## 2020-09-06 MED ORDER — AMOXICILLIN 500 MG PO CAPS: 2000.0000 mg | ORAL_CAPSULE | Freq: Once | ORAL | 1 refills | Status: DC | PRN

## 2020-09-06 NOTE — Telephone Encounter (Signed)
   Primary Cardiologist: Dr Burt Knack  Chart reviewed as part of pre-operative protocol coverage.    Simple dental procedures are considered low risk procedures per guidelines and generally do not require any specific cardiac clearance. It is also generally accepted that for simple extractions and dental cleanings, there is no need to interrupt blood thinner therapy.  The patient was advised that if she develops new symptoms prior to surgery to contact our office to arrange for a follow-up visit, and she verbalized understanding.  SBE prophylaxis is required for the patient. Rx for Amoxicillin 2gm 1 hour pre op sent to her pharmacy and discussed with the patient.   I will route this recommendation to the requesting party via Epic fax function and remove from pre-op pool.  Please call with questions.  Kerin Ransom, PA-C 09/06/2020, 8:48 AM

## 2020-09-06 NOTE — Progress Notes (Signed)
SBE prophylaxis discussed with patient and Rx sent to pharmacy.  Per dr Burt Knack- SBE prophylaxis for 6 months s/p ASD closure.  Kerin Ransom PA-C 09/06/2020 8:47 AM

## 2020-09-11 ENCOUNTER — Other Ambulatory Visit: Payer: Self-pay | Admitting: Family Medicine

## 2020-09-11 DIAGNOSIS — Q211 Atrial septal defect, unspecified: Secondary | ICD-10-CM

## 2020-09-11 MED FILL — PANTOPRAZOLE SOD DR 40 MG T: 40 | 90 days supply | Qty: 90 | Fill #0

## 2020-09-15 NOTE — Telephone Encounter (Signed)
From Dr Leonie Man: "I spoke to the patient and addressed her concerns. She had transient tingling of the left hand and part of her foot that lasted about an hour and settle down. She felt she was anxious at that time. There is no accompanying headache, blurred vision weakness or speech difficulties. I told her it was unclear as to what the etiology of this was but probably not something severe or significant. I recommend she stay on aspirin and Plavix and continue her follow-up appointments with Dr. Burt Knack and with me. If symptoms recur or she had new or worsening symptoms she was advised to call back or go to the ER. She voiced understanding".

## 2020-09-19 ENCOUNTER — Other Ambulatory Visit: Payer: No Typology Code available for payment source | Admitting: Obstetrics and Gynecology

## 2020-09-20 ENCOUNTER — Other Ambulatory Visit: Payer: Self-pay | Admitting: Family Medicine

## 2020-09-20 ENCOUNTER — Encounter: Payer: Self-pay | Admitting: Family Medicine

## 2020-09-20 ENCOUNTER — Other Ambulatory Visit: Payer: No Typology Code available for payment source | Admitting: Obstetrics and Gynecology

## 2020-09-20 ENCOUNTER — Encounter: Payer: Self-pay | Admitting: *Deleted

## 2020-09-20 DIAGNOSIS — R002 Palpitations: Secondary | ICD-10-CM

## 2020-09-20 NOTE — Progress Notes (Signed)
Patient ID: Marissa Burnett, female   DOB: 10-Nov-1986, 34 y.o.   MRN: [id number] Patient enrolled for [employer] to ship a 14 day ZIO AT long term monitor- Live Telemetry to her home.

## 2020-09-22 ENCOUNTER — Encounter: Payer: Self-pay | Admitting: Cardiovascular Disease

## 2020-09-22 ENCOUNTER — Other Ambulatory Visit: Payer: Self-pay | Admitting: Physician Assistant

## 2020-09-22 ENCOUNTER — Ambulatory Visit (INDEPENDENT_AMBULATORY_CARE_PROVIDER_SITE_OTHER): Payer: No Typology Code available for payment source

## 2020-09-22 DIAGNOSIS — R002 Palpitations: Secondary | ICD-10-CM

## 2020-09-22 MED ORDER — METOPROLOL SUCCINATE ER 25 MG PO TB24: 25.0000 mg | ORAL_TABLET | Freq: Every day | ORAL | 1 refills | Status: DC

## 2020-09-25 ENCOUNTER — Emergency Department (HOSPITAL_COMMUNITY): Payer: No Typology Code available for payment source

## 2020-09-25 ENCOUNTER — Observation Stay (HOSPITAL_COMMUNITY)
Admission: EM | Admit: 2020-09-25 | Discharge: 2020-09-26 | Disposition: A | Discharge status: Home / Self Care | Payer: No Typology Code available for payment source | Attending: Cardiovascular Disease | Admitting: Cardiovascular Disease

## 2020-09-25 ENCOUNTER — Encounter (HOSPITAL_COMMUNITY): Payer: Self-pay | Admitting: Emergency Medicine

## 2020-09-25 ENCOUNTER — Other Ambulatory Visit: Payer: Self-pay

## 2020-09-25 DIAGNOSIS — I493 Ventricular premature depolarization: Secondary | ICD-10-CM | POA: Insufficient documentation

## 2020-09-25 DIAGNOSIS — I491 Atrial premature depolarization: Secondary | ICD-10-CM

## 2020-09-25 DIAGNOSIS — Z79899 Other long term (current) drug therapy: Secondary | ICD-10-CM | POA: Diagnosis not present

## 2020-09-25 DIAGNOSIS — E876 Hypokalemia: Secondary | ICD-10-CM | POA: Insufficient documentation

## 2020-09-25 DIAGNOSIS — Z20822 Contact with and (suspected) exposure to covid-19: Secondary | ICD-10-CM | POA: Diagnosis not present

## 2020-09-25 DIAGNOSIS — R0789 Other chest pain: Secondary | ICD-10-CM | POA: Diagnosis present

## 2020-09-25 DIAGNOSIS — R002 Palpitations: Secondary | ICD-10-CM | POA: Diagnosis not present

## 2020-09-25 DIAGNOSIS — Z7982 Long term (current) use of aspirin: Secondary | ICD-10-CM | POA: Insufficient documentation

## 2020-09-25 DIAGNOSIS — R42 Dizziness and giddiness: Secondary | ICD-10-CM

## 2020-09-25 HISTORY — DX: Migraine, unspecified, not intractable, without status migrainosus: G43.909

## 2020-09-25 LAB — BASIC METABOLIC PANEL
Anion gap: 14 (ref 5–15)
BUN: 12 mg/dL (ref 6–20)
CO2: 21 mmol/L — ABNORMAL LOW (ref 22–32)
Calcium: 9.6 mg/dL (ref 8.9–10.3)
Chloride: 103 mmol/L (ref 98–111)
Creatinine, Ser: 0.74 mg/dL (ref 0.44–1.00)
GFR calc Af Amer: >60 mL/min (ref >60)
GFR calc non Af Amer: >60 mL/min (ref >60)
Glucose, Bld: 108 mg/dL — ABNORMAL HIGH (ref 70–99)
Potassium: 3.4 mmol/L — ABNORMAL LOW (ref 3.5–5.1)
Sodium: 138 mmol/L (ref 135–145)

## 2020-09-25 LAB — I-STAT BETA HCG BLOOD, ED (MC, WL, AP ONLY): I-stat hCG, quantitative: <5 m[IU]/mL (ref <5)

## 2020-09-25 LAB — CBC
HCT: 42 % (ref 36.0–46.0)
Hemoglobin: 13.7 g/dL (ref 12.0–15.0)
MCH: 30.2 pg (ref 26.0–34.0)
MCHC: 32.6 g/dL (ref 30.0–36.0)
MCV: 92.5 fL (ref 80.0–100.0)
Platelets: 230 10*3/uL (ref 150–400)
RBC: 4.54 MIL/uL (ref 3.87–5.11)
RDW: 11.9 % (ref 11.5–15.5)
WBC: 7.4 10*3/uL (ref 4.0–10.5)
nRBC: 0 % (ref 0.0–0.2)

## 2020-09-25 LAB — MAGNESIUM: Magnesium: 1.8 mg/dL (ref 1.7–2.4)

## 2020-09-25 LAB — RESPIRATORY PANEL BY RT PCR (FLU A&B, COVID)
Influenza A by PCR: NEGATIVE
Influenza B by PCR: NEGATIVE
SARS Coronavirus 2 by RT PCR: NEGATIVE

## 2020-09-25 LAB — TROPONIN I (HIGH SENSITIVITY)
Troponin I (High Sensitivity): <2 ng/L (ref <18)
Troponin I (High Sensitivity): <2 ng/L (ref <18)

## 2020-09-25 LAB — TSH: TSH: 0.576 u[IU]/mL (ref 0.350–4.500)

## 2020-09-25 LAB — T4, FREE: Free T4: 1.06 ng/dL (ref 0.61–1.12)

## 2020-09-25 MED ORDER — HEPARIN SODIUM (PORCINE) 5000 UNIT/ML IJ SOLN
5000.0000 [IU] | Freq: Three times a day (TID) | INTRAMUSCULAR | Status: DC
  Administered 2020-09-25 – 2020-09-26 (×2): 5000 [IU] via SUBCUTANEOUS
  Filled 2020-09-25 (×2): qty 1

## 2020-09-25 MED ORDER — ASPIRIN EC 81 MG PO TBEC
81.0000 mg | DELAYED_RELEASE_TABLET | Freq: Every day | ORAL | Status: DC
  Administered 2020-09-26: 81 mg via ORAL
  Filled 2020-09-25: qty 1

## 2020-09-25 MED ORDER — SODIUM CHLORIDE 0.9% FLUSH
3.0000 mL | Freq: Two times a day (BID) | INTRAVENOUS | Status: DC
  Administered 2020-09-26: 3 mL via INTRAVENOUS

## 2020-09-25 MED ORDER — SODIUM CHLORIDE 0.9 % IV BOLUS
1000.0000 mL | Freq: Once | INTRAVENOUS | Status: AC
  Administered 2020-09-25: 1000 mL via INTRAVENOUS

## 2020-09-25 MED ORDER — ACETAMINOPHEN 500 MG PO TABS: 1000.0000 mg | ORAL_TABLET | Freq: Every day | ORAL | Status: DC | PRN

## 2020-09-25 MED ORDER — POTASSIUM CHLORIDE CRYS ER 20 MEQ PO TBCR
40.0000 meq | EXTENDED_RELEASE_TABLET | Freq: Once | ORAL | Status: AC
  Administered 2020-09-25: 40 meq via ORAL
  Filled 2020-09-25: qty 2

## 2020-09-25 MED ORDER — METOPROLOL SUCCINATE ER 25 MG PO TB24
25.0000 mg | ORAL_TABLET | Freq: Once | ORAL | Status: AC
  Administered 2020-09-25: 25 mg via ORAL
  Filled 2020-09-25: qty 1

## 2020-09-25 MED ORDER — SODIUM CHLORIDE 0.9 % IV SOLN: 250.0000 mL | INTRAVENOUS | Status: DC | PRN

## 2020-09-25 MED ORDER — MAGNESIUM SULFATE 2 GM/50ML IV SOLN
2.0000 g | Freq: Once | INTRAVENOUS | Status: AC
  Administered 2020-09-25: 2 g via INTRAVENOUS
  Filled 2020-09-25: qty 50

## 2020-09-25 MED ORDER — SODIUM CHLORIDE 0.9% FLUSH: 3.0000 mL | INTRAVENOUS | Status: DC | PRN

## 2020-09-25 MED ORDER — POLYVINYL ALCOHOL-POVIDONE PF 1.4-0.6 % OP SOLN: 1.0000 [drp] | OPHTHALMIC | Status: DC | PRN

## 2020-09-25 MED ORDER — MAGNESIUM OXIDE 400 (241.3 MG) MG PO TABS
400.0000 mg | ORAL_TABLET | Freq: Every day | ORAL | Status: DC
  Administered 2020-09-26: 400 mg via ORAL
  Filled 2020-09-25: qty 1

## 2020-09-25 MED ORDER — CLOPIDOGREL BISULFATE 75 MG PO TABS
75.0000 mg | ORAL_TABLET | Freq: Every day | ORAL | Status: DC
  Administered 2020-09-26: 75 mg via ORAL
  Filled 2020-09-25: qty 1

## 2020-09-25 MED ORDER — METOPROLOL SUCCINATE ER 50 MG PO TB24
50.0000 mg | ORAL_TABLET | Freq: Every day | ORAL | Status: DC
  Filled 2020-09-25: qty 1

## 2020-09-25 MED ORDER — POLYVINYL ALCOHOL 1.4 % OP SOLN
1.0000 [drp] | OPHTHALMIC | Status: DC | PRN
  Filled 2020-09-25: qty 15

## 2020-09-25 MED ORDER — ALPRAZOLAM 0.5 MG PO TABS: 0.5000 mg | ORAL_TABLET | Freq: Two times a day (BID) | ORAL | Status: DC | PRN

## 2020-09-25 MED ORDER — LORATADINE 10 MG PO TABS
10.0000 mg | ORAL_TABLET | Freq: Every day | ORAL | Status: DC
  Administered 2020-09-26: 10 mg via ORAL
  Filled 2020-09-25: qty 1

## 2020-09-25 MED ORDER — PANTOPRAZOLE SODIUM 40 MG PO TBEC
40.0000 mg | DELAYED_RELEASE_TABLET | Freq: Every day | ORAL | Status: DC
  Administered 2020-09-26: 40 mg via ORAL
  Filled 2020-09-25: qty 1

## 2020-09-25 MED FILL — ASPIRIN LOW DOSE 81 MG TBEC: 81 | 30 days supply | Qty: 30 | Fill #2

## 2020-09-25 MED FILL — CLOPIDOGREL 75 MG TABLET: 75 | 30 days supply | Qty: 30 | Fill #1

## 2020-09-25 NOTE — Progress Notes (Addendum)
Patient arrived from ED to [place] floor room 2. Patient alert and oriented x4, denying chest pain and shortness of breath. She did mention feeling palpations, EKG obtained. SR with PVCs. HR 114s BP 104/59. Paged Cardiology about giving 25 mg metoprolol XR. MD okay to give medication. Will monitor patients blood pressure closely.   0230 update: patients blood pressure steadily decresing. Recent BP 87/60, upon assessment patient stating she is feeling slightly dizzy. Paged Cardiology got orders for 560ml Nacl bolus. Will reassess patients blood pressure.

## 2020-09-25 NOTE — ED Triage Notes (Signed)
Pt was working upstairs as RT- heart began racing and she became pale and diaphoretic. HX of ASD repair 4 weeks ago- has had PVCs and PACs since. Is wearing a heart monitor, has also notified the PA for Cards -- on arrival pt is clammy, pale,

## 2020-09-25 NOTE — H&P (Addendum)
Cardiology Admission History and Physical:   Patient ID: Marissa Burnett MRN: [id number]; DOB: [birth year]   Admission date: 09/25/2020  Primary Care Provider: [name], MD [facility] Cardiologist: Sherren Mocha, MD  [facility] Electrophysiologist:  None   Chief Complaint:  palpitations  Patient Profile:   Marissa Burnett is a 34 y.o. female with migraine with aura, L MCA stroke in 06/2020, found to have secundum ASD s/p subsequent closure 08/30/20 who is being seen today for the evaluation of frequent ectopy at the request of Shawn Joy PA-C.  History of Present Illness:   Ms. Kashani hasa hx of migraine with aura, but presented in 06/2020 with visual aura along with expressive aphasia, hand clumsiness and numbness. She was found to have 2-3 punctate ischemic infarcts in the left MCA territory and was sent to the emergency room.Stroke evaluation otherwise showed no evidence of large vessel vascular disease.LV function was normal by echo assessment and there was no valvular disease. Hypercoagulable panel was negative.She underwent a transcranial Doppler study that was suggestive of PFO.She underwent an outpatient transesophageal echo by Dr. Aundra Dubin on August 04, 2020 and was found to have normal LV size and function with an LVEF of 60 to 65%. The RV was felt to be borderline dilated with normal RV systolic function. There was a secundum ASD present with left-to-right flow. She subsequently underwent transcatheter ASD closure using an 18 mm Amplatzer septal occluder device on 08/30/20.   She recently reached out to the office reporting intermittent palpitations. She reports the palpitations began on 09/19/20 (around the 3 week mark). Initially when they began, they were only lasting for brief periods of time. However, over the past few days these have become more frequent, more strong and persistent. They are sometimes associated with hiccups. She submitted a copy of her Apple  watch/Kardia tracings to MyChart which showed NSR with suspected PVCs (cannot exclude PACs with abberrant conduction). A Zio monitor was mailed to her house. Toprol XL 25mg  daily was called in.   She's been on the metoprolol for 3 days and reports she feels like symptoms have gotten worse. She took it around 2pm today. 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 They had began much earlier than usual today. She felt pale/diaphoretic so took her metoprolol and Xanax and was transported to the ER. In the ER,  EKG showed sinus tachycardia 126bpm with what Dr. Audie Box feels are frequent PACs with Ashman's phenomenon (no compensatory pause). CXR NAD. Troponin <2, CBC wnl, hypokalemia of 3.4, Mg 1.8, Cr wnl. Here in the ED she is feeling better with rest but still feeling the ongoing ectopy. She does not recall ever having these kind of palpitations prior to her closure.   Past Medical History:  Diagnosis Date  . Abnormal Papanicolaou smear of cervix 10/15/2016   LSIL+HPV  . Allergy   . Anxiety   . ASD (atrial septal defect) 07/28/2020  . Chronic lumbar pain 09/27/2018   Patient has seen Dr. Arnoldo Morale neurosurgery September 2019-they do not feel there is anything surgical that can help-they are recommending injections with pain medicine specialist  . Gallstones   . GERD (gastroesophageal reflux disease)   . Heart murmur birth  . Migraine   . Stroke ([place]) 07/12/2020   a. L MCA stroke in 07/2020.    Past Surgical History:  Procedure Laterality Date  . ABDOMINOPLASTY    . ATRIAL SEPTAL DEFECT(ASD) CLOSURE N/A  08/30/2020   Procedure: ATRIAL SEPTAL DEFECT (ASD) CLOSURE;  Surgeon: Sherren Mocha, MD;  Location: [facility];  Service: Cardiovascular;  Laterality: N/A;  . BREAST ENHANCEMENT SURGERY    . CERVICAL CONIZATION W/BX N/A 12/13/2016   Procedure: CONIZATION CERVIX WITH BIOPSY--cold knife conization;  Surgeon: Jonnie Kind, MD;  Location:  [facility];  Service: Gynecology;  Laterality: N/A;  . CHOLECYSTECTOMY  02/2002  . ENDOSCOPIC RETROGRADE CHOLANGIOPANCREATOGRAPHY (ERCP) WITH PROPOFOL    . OTHER SURGICAL HISTORY     stent in kidney during pregnancy,2002; removed in 2003  . TEE WITHOUT CARDIOVERSION N/A 08/04/2020   Procedure: TRANSESOPHAGEAL ECHOCARDIOGRAM (TEE);  Surgeon: Larey Dresser, MD;  Location: [facility] ENDOS[facility];  Service: Cardiovascular;  Laterality: N/A;     Medications Prior to Admission: Prior to Admission medications   Medication Sig Start Date End Date Taking? Authorizing Provider  clopidogrel (PLAVIX) 75 MG tablet Take 75 mg by mouth daily.   Yes [provider]  acetaminophen (TYLENOL) 500 MG tablet Take 1,000 mg by mouth daily as needed for headache.     [provider]  ALPRAZolam (XANAX) 0.5 MG tablet TAKE 1 TABLET(0.5 MG) BY MOUTH TWICE DAILY AS NEEDED FOR ANXIETY 09/20/20   Luking, Elayne Snare, MD  amoxicillin (AMOXIL) 500 MG capsule Take 4 capsules (2,000 mg total) by mouth once as needed for up to 1 dose (take one hour prior to procedures such as dental cleaning). 09/06/20   Erlene Quan, PA-C  aspirin EC 81 MG EC tablet Take 1 tablet (81 mg total) by mouth daily. Swallow whole. 07/29/20   Little Ishikawa, MD  BIOTIN PO Take 1 tablet by mouth daily.     [provider]  EPINEPHrine 0.3 mg/0.3 mL IJ SOAJ injection Inject 0.3 mLs (0.3 mg total) into the muscle once as needed for up to 1 dose for anaphylaxis. 08/21/20   Althea Charon, FNP  loratadine (CLARITIN) 10 MG tablet Take 10 mg by mouth daily.    [provider]  metoprolol succinate (TOPROL XL) 25 MG 24 hr tablet Take 1 tablet (25 mg total) by mouth daily. Take with or immediately following a meal. 09/22/20 09/22/21  Eileen Stanford, PA-C  Multiple Vitamins-Minerals (EMERGEN-C VITAMIN C) PACK Take 1 packet by mouth daily.    [provider]  pantoprazole (PROTONIX) 40 MG tablet TAKE 1 TABLET (40 MG TOTAL) BY  MOUTH DAILY. 09/11/20   [name], MD  Polyvinyl Alcohol-Povidone (REFRESH OP) Place 1 drop into both eyes daily as needed (dry eyes).    [provider]     Allergies:    Allergies  Allergen Reactions  . Peanut-Containing Drug Products Anaphylaxis    THROAT GETS TIGHT, peanut butter    Social History:   Social History   Socioeconomic History  . Marital status: Married    Spouse name: Not on file  . Number of children: 2  . Years of education: Not on file  . Highest education level: Not on file  Occupational History  . Occupation: resp therapist    Employer: [employer]  Tobacco Use  . Smoking status: Never Smoker  . Smokeless tobacco: Never Used  Vaping Use  . Vaping Use: Never used  Substance and Sexual Activity  . Alcohol use: Not Currently  . Drug use: No  . Sexual activity: Yes    Birth control/protection: None  Other Topics Concern  . Not on file  Social History Narrative   Married, respiratory  therapist [facility]   2 children   Occasional alcohol, never smoker no drug use   Previous work experience Restaurant manager, fast food   Social Determinants of Radio broadcast assistant Strain:   . Difficulty of Paying Living Expenses: Not on file  Food Insecurity:   . Worried About Charity fundraiser in the Last Year: Not on file  . Ran Out of Food in the Last Year: Not on file  Transportation Needs:   . Lack of Transportation (Medical): Not on file  . Lack of Transportation (Non-Medical): Not on file  Physical Activity:   . Days of Exercise per Week: Not on file  . Minutes of Exercise per Session: Not on file  Stress:   . Feeling of Stress : Not on file  Social Connections:   . Frequency of Communication with Friends and Family: Not on file  . Frequency of Social Gatherings with Friends and Family: Not on file  . Attends Religious Services: Not on file  . Active Member of Clubs or Organizations: Not on file  . Attends Theatre manager Meetings: Not on file  . Marital Status: Not on file  Intimate Partner Violence:   . Fear of Current or Ex-Partner: Not on file  . Emotionally Abused: Not on file  . Physically Abused: Not on file  . Sexually Abused: Not on file    Family History:   The patient's family history includes ADD / ADHD in her daughter; Anxiety disorder in her daughter; COPD in her maternal grandfather; Colon polyps in her paternal grandmother; Diabetes in her maternal grandmother and another family member; Heart disease in her paternal grandmother; Irritable bowel syndrome in her paternal grandmother; Lung cancer in her paternal grandfather. There is no history of Allergic rhinitis, Angioedema, Asthma, Atopy, Eczema, Immunodeficiency, or Urticaria.    ROS:  Please see the history of present illness. Has had 2 migraines with aura since stroke, has made neurologist aware. All other ROS reviewed and negative.     Physical Exam/Data:   Vitals:   09/25/20 1530 09/25/20 1545 09/25/20 1600 09/25/20 1615  BP: 111/76 105/75 115/72 123/76  Pulse: 90 (!) 59 (!) 50   Resp: 19 (!) 22 (!) 27 16  Temp:      TempSrc:      SpO2: 96% 100% 100%   Weight:      Height:       No intake or output data in the 24 hours ending 09/25/20 1720 Last 3 Weights 09/25/2020 08/30/2020 08/28/2020  Weight (lbs) 156 lb 8.4 oz 158 lb 6.4 oz 162 lb  Weight (kg) 71 kg 71.85 kg 73.483 kg     Body mass index is 26.87 kg/m.  Vital Signs. BP 123/76   Pulse (!) 50   Temp 98.4 F (36.9 C) (Oral)   Resp 16   Ht 5\' 4"  (1.626 m)   Wt 71 kg   LMP 09/24/2020   SpO2 100%   BMI 26.87 kg/m  General: Well developed, well nourished WF, in no acute distress. Head: Normocephalic, atraumatic, sclera non-icteric, no xanthomas, nares are without discharge. Neck: Negative for carotid bruits. JVP not elevated. Lungs: Clear bilaterally to auscultation without wheezes, rales, or rhonchi. Breathing is unlabored. Heart: RRR, frequent ectopy, S1  S2 without murmurs, rubs, or gallops.  Abdomen: Soft, non-tender, non-distended with normoactive bowel sounds. No rebound/guarding. Extremities: No clubbing or cyanosis. No edema. Distal pedal pulses are 2+ and equal bilaterally. Neuro: Alert and oriented  X 3. Moves all extremities spontaneously. Psych:  Responds to questions appropriately with a normal affect.   EKG: The EKG was personally reviewed and demonstrates: EKG showed sinus tachycardia 126bpm, suspected PACs with abberrant conduction (no compensatory pause), RSR V1.   Relevant CV Studies: 2D Echo 08/30/20 Patient Name:  ANAVEY COOMBES [facility] Date of Exam: 08/30/2020  Medical Rec #: [id number]    Height:    64.0 in  Accession #:  [id number]    Weight:    158.4 lb  Date of Birth: 06-Oct-1986    BSA:     1.772 m  Patient Age:  34 years     BP:      115/71 mmHg  Patient Gender: F        HR:      98 bpm.  Exam Location: Inpatient   Procedure: Limited Echo and Color Doppler   Indications:  s/p ASD closure    History:    Patient has prior history of Echocardiogram examinations,  most         recent 08/04/2020. Stroke; Signs/Symptoms:ASD.    Sonographer:  Dustin Flock  Referring Phys: [phone] MICHAEL COOPER           Limited study after ASD septal device closure.         Amplatzer septal occluder appears well deployed and  stable.         No evidence of residualinteratrial shunt by collor Doppler         Saline contrast study was not performed.         No pericardial effusion.         The inferior vena cava is not dilated.         Normal left ventricular systolic function and regional  wall         motion.     LEFT VENTRICLE  PLAX 2D  LVIDd:     4.40 cm  LVIDs:     3.50 cm  LV PW:     1.00 cm  LV IVS:    0.90 cm     Mihai Croitoru MD  Electronically signed by  Sanda Klein MD  Signature Date/Time: 08/30/2020/12:59:45 PM  Final   Laboratory Data:  High Sensitivity Troponin:   Recent Labs  Lab 09/25/20 1440  TROPONINIHS <2      Chemistry Recent Labs  Lab 09/25/20 1440  NA 138  K 3.4*  CL 103  CO2 21*  GLUCOSE 108*  BUN 12  CREATININE 0.74  CALCIUM 9.6  GFRNONAA >60  GFRAA >60  ANIONGAP 14    No results for input(s): PROT, ALBUMIN, AST, ALT, ALKPHOS, BILITOT in the last 168 hours. Hematology Recent Labs  Lab 09/25/20 1440  WBC 7.4  RBC 4.54  HGB 13.7  HCT 42.0  MCV 92.5  MCH 30.2  MCHC 32.6  RDW 11.9  PLT 230   BNPNo results for input(s): BNP, PROBNP in the last 168 hours.  DDimer No results for input(s): DDIMER in the last 168 hours.   Radiology/Studies:  DG Chest Portable 1 View  Result Date: 09/25/2020 CLINICAL DATA:  Arrhythmia, tachycardia. EXAM: PORTABLE CHEST 1 VIEW COMPARISON:  January 29, 2006 FINDINGS: The heart size and mediastinal contours are within normal limits. A radiopaque stimulator device is seen overlying the upper left lung. A radiopaque patent foramina ovale closure device is also seen. Both lungs are clear. The visualized skeletal structures are unremarkable. IMPRESSION: No active disease. Electronically Signed   By: Hoover Browns  [place] M.D.   On: 09/25/2020 15:15     Assessment and Plan:   1. Palpitations/frequent ectopy (also at times associated with hiccups?) - per MD review, suspected to be PACs with Ashman phenomenon given no compensatory pauses - check TSH, free T4 - replete lytes to keep goal K 4.0 and Mg 2.0 or greater - give KCl 33meq now and Mag sulfate 2g with addition of MagOx 400mg  daily thereafter - increase metoprolol to 50mg  daily (give additional 25mg  now) - follow on telemetry overnight to exclude development of atrial fib - plan echo tomorrow morning - also received IV fluids in ED  2. History of stroke and ASD with history of closure 08/30/20 - continue aspirin/Plavix  (Plavix fell off the list prior to ER visit but she confirms she is taking today - took both this AM) - f/u echo tomorrow  3. Hypokalemia - K 3.4, replete as above  Severity of Illness: The appropriate patient status for this patient is OBSERVATION. Observation status is judged to be reasonable and necessary in order to provide the required intensity of service to ensure the patient's safety. The patient's presenting symptoms, physical exam findings, and initial radiographic and laboratory data in the context of their medical condition is felt to place them at decreased risk for further clinical deterioration. Furthermore, it is anticipated that the patient will be medically stable for discharge from the hospital within 2 midnights of admission. The following factors support the patient status of observation.   " The patient's presenting symptoms include palpitations. " The physical exam findings include ectopy. " The initial radiographic and laboratory data are notable for hypokalemia.     For questions or updates, please contact [place] Please consult [email or web address] for contact info under     Signed, Charlie Pitter, PA-C  09/25/2020 5:20 PM

## 2020-09-25 NOTE — ED Provider Notes (Signed)
[facility] Provider Note   CSN: [id number] Arrival date & time: 09/25/20  1433     History Chief Complaint  Patient presents with  . SVT  . Irregular Heart Beat    Marissa Burnett is a 34 y.o. female.  HPI      Marissa Burnett is a 34 y.o. female, with a history of ASD with repair, anxiety, GERD, stroke, presenting to the ED with chest discomfort and palpitations occurring throughout the day today.  Patient states she underwent ASD repair about 4 weeks ago by Dr. Burt Knack.  She felt quite well until about a week ago.  She then began to have irregular heartbeat and palpitations intermittently, but increasing in frequency and intensity. Her symptoms were constant beginning 4 PM yesterday. While at work today she began to feel lightheaded, diaphoretic, short of breath.  Coworkers report she was also pale. When she called the cardiology office last week, she was placed on metoprolol.  This is her third day of taking the metoprolol, however, her symptoms have continued to worsen despite this medication.  She drinks 1 cup of coffee a day.  She states she also drinks 60 to 80 ounces of water daily. At the time my evaluation, patient was at rest and she denied any current symptoms. Denies fever/chills, cough, syncope, vomiting, diarrhea, abdominal pain, or any other complaints.  Past Medical History:  Diagnosis Date  . Abnormal Papanicolaou smear of cervix 10/15/2016   LSIL+HPV  . Allergy   . Anxiety   . ASD (atrial septal defect) 07/28/2020  . Chronic lumbar pain 09/27/2018   Patient has seen Dr. Arnoldo Morale neurosurgery September 2019-they do not feel there is anything surgical that can help-they are recommending injections with pain medicine specialist  . Gallstones   . GERD (gastroesophageal reflux disease)   . Heart murmur birth  . Migraine   . Stroke ([place]) 07/12/2020   a. L MCA stroke in 06/2020.    Patient Active Problem List    Diagnosis Date Noted  . Premature atrial contractions 09/25/2020  . Hypokalemia 09/25/2020  . Atrial septal defect 08/30/2020  . Contact dermatitis due to metals 08/23/2020  . CVA (cerebral vascular accident) ([place]) 07/27/2020  . Abnormal MRI 07/27/2020  . GERD (gastroesophageal reflux disease) 07/27/2020  . Migraine 07/27/2020  . Insomnia 04/30/2020  . Gastroesophageal reflux disease with esophagitis 07/01/2019  . Chronic lumbar pain 09/27/2018  . Somatic dysfunction of left sacroiliac joint 02/09/2018  . CIN III (cervical intraepithelial neoplasia grade III) with severe dysplasia 11/18/2016  . Abnormal Papanicolaou smear of cervix 10/15/2016  . Urticaria 03/16/2016  . Anxiety 06/30/2015  . ADD (attention deficit disorder) 07/20/2014    Past Surgical History:  Procedure Laterality Date  . ABDOMINOPLASTY    . ATRIAL SEPTAL DEFECT(ASD) CLOSURE N/A 08/30/2020   Procedure: ATRIAL SEPTAL DEFECT (ASD) CLOSURE;  Surgeon: Sherren Mocha, MD;  Location: [facility];  Service: Cardiovascular;  Laterality: N/A;  . BREAST ENHANCEMENT SURGERY    . CERVICAL CONIZATION W/BX N/A 12/13/2016   Procedure: CONIZATION CERVIX WITH BIOPSY--cold knife conization;  Surgeon: Jonnie Kind, MD;  Location: [facility];  Service: Gynecology;  Laterality: N/A;  . CHOLECYSTECTOMY  02/2002  . ENDOSCOPIC RETROGRADE CHOLANGIOPANCREATOGRAPHY (ERCP) WITH PROPOFOL    . OTHER SURGICAL HISTORY     stent in kidney during pregnancy,2002; removed in 2003  . TEE WITHOUT CARDIOVERSION N/A 08/04/2020   Procedure: TRANSESOPHAGEAL ECHOCARDIOGRAM (TEE);  Surgeon: Larey Dresser, MD;  Location: [facility];  Service: Cardiovascular;  Laterality: N/A;     OB History    Gravida  4   Para  2   Term  2   Preterm      AB  2   Living  2     SAB  2   TAB      Ectopic      Multiple      Live Births  2           Family History  Problem Relation Age of Onset  . Diabetes Other   . Lung cancer Paternal  Grandfather   . Diabetes Maternal Grandmother   . COPD Maternal Grandfather   . Heart disease Paternal Grandmother   . Colon polyps Paternal Grandmother   . Irritable bowel syndrome Paternal Grandmother   . ADD / ADHD Daughter   . Anxiety disorder Daughter   . Allergic rhinitis Neg Hx   . Angioedema Neg Hx   . Asthma Neg Hx   . Atopy Neg Hx   . Eczema Neg Hx   . Immunodeficiency Neg Hx   . Urticaria Neg Hx     Social History   Tobacco Use  . Smoking status: Never Smoker  . Smokeless tobacco: Never Used  Vaping Use  . Vaping Use: Never used  Substance Use Topics  . Alcohol use: Not Currently  . Drug use: No    Home Medications Prior to Admission medications   Medication Sig Start Date End Date Taking? Authorizing Provider  clopidogrel (PLAVIX) 75 MG tablet Take 75 mg by mouth daily.   Yes [provider]  acetaminophen (TYLENOL) 500 MG tablet Take 1,000 mg by mouth daily as needed for headache.     [provider]  ALPRAZolam (XANAX) 0.5 MG tablet TAKE 1 TABLET(0.5 MG) BY MOUTH TWICE DAILY AS NEEDED FOR ANXIETY 09/20/20   Luking, Elayne Snare, MD  amoxicillin (AMOXIL) 500 MG capsule Take 4 capsules (2,000 mg total) by mouth once as needed for up to 1 dose (take one hour prior to procedures such as dental cleaning). 09/06/20   Erlene Quan, PA-C  aspirin EC 81 MG EC tablet Take 1 tablet (81 mg total) by mouth daily. Swallow whole. 07/29/20   Little Ishikawa, MD  BIOTIN PO Take 1 tablet by mouth daily.     [provider]  EPINEPHrine 0.3 mg/0.3 mL IJ SOAJ injection Inject 0.3 mLs (0.3 mg total) into the muscle once as needed for up to 1 dose for anaphylaxis. 08/21/20   Althea Charon, FNP  loratadine (CLARITIN) 10 MG tablet Take 10 mg by mouth daily.    [provider]  metoprolol succinate (TOPROL XL) 25 MG 24 hr tablet Take 1 tablet (25 mg total) by mouth daily. Take with or immediately following a meal. 09/22/20 09/22/21  Eileen Stanford,  PA-C  Multiple Vitamins-Minerals (EMERGEN-C VITAMIN C) PACK Take 1 packet by mouth daily.    [provider]  pantoprazole (PROTONIX) 40 MG tablet TAKE 1 TABLET (40 MG TOTAL) BY MOUTH DAILY. 09/11/20   [name], MD  Polyvinyl Alcohol-Povidone (REFRESH OP) Place 1 drop into both eyes daily as needed (dry eyes).    [provider]    Allergies    Peanut-containing drug products  Review of Systems   Review of Systems  Constitutional: Negative for chills and fever.  Respiratory: Positive for shortness of breath.   Cardiovascular: Positive for chest pain and palpitations.  Gastrointestinal: Positive for nausea. Negative for abdominal pain, diarrhea and vomiting.  Musculoskeletal: Negative for back pain.  Neurological: Positive for light-headedness. Negative for syncope.  All other systems reviewed and are negative.   Physical Exam Updated Vital Signs BP 132/81 (BP Location: Right Arm)   Pulse (!) 113   Temp 98.4 F (36.9 C) (Oral)   Resp (!) 22   Ht 5\' 4"  (1.626 m)   Wt 71 kg   LMP 09/24/2020   SpO2 100%   BMI 26.87 kg/m   Physical Exam Vitals and nursing note reviewed.  Constitutional:      General: She is not in acute distress.    Appearance: She is well-developed. She is not diaphoretic.  HENT:     Head: Normocephalic and atraumatic.     Mouth/Throat:     Mouth: Mucous membranes are moist.     Pharynx: Oropharynx is clear.  Eyes:     Conjunctiva/sclera: Conjunctivae normal.  Cardiovascular:     Rate and Rhythm: Normal rate and regular rhythm.     Pulses: Normal pulses.          Radial pulses are 2+ on the right side and 2+ on the left side.       Posterior tibial pulses are 2+ on the right side and 2+ on the left side.     Heart sounds: Normal heart sounds.     Comments: Tactile temperature in the extremities appropriate and equal bilaterally. Pulse rate about 94 on my assessment, but was noted to increase to about 107 with movement or  sitting up. Pulmonary:     Effort: Pulmonary effort is normal. No respiratory distress.     Breath sounds: Normal breath sounds.  Abdominal:     Palpations: Abdomen is soft.     Tenderness: There is no abdominal tenderness. There is no guarding.  Musculoskeletal:     Cervical back: Neck supple.     Right lower leg: No edema.     Left lower leg: No edema.  Lymphadenopathy:     Cervical: No cervical adenopathy.  Skin:    General: Skin is warm and dry.  Neurological:     Mental Status: She is alert.  Psychiatric:        Mood and Affect: Mood and affect normal.        Speech: Speech normal.        Behavior: Behavior normal.     ED Results / Procedures / Treatments   Labs (all labs ordered are listed, but only abnormal results are displayed) Labs Reviewed  BASIC METABOLIC PANEL - Abnormal; Notable for the following components:      Result Value   Potassium 3.4 (*)    CO2 21 (*)    Glucose, Bld 108 (*)    All other components within normal limits  RESPIRATORY PANEL BY RT PCR (FLU A&B, COVID)  CBC  MAGNESIUM  TSH  T4, FREE  BASIC METABOLIC PANEL  MAGNESIUM  I-STAT BETA HCG BLOOD, ED ([facility], [facility], [facility] ONLY)  TROPONIN I (HIGH SENSITIVITY)  TROPONIN I (HIGH SENSITIVITY)    EKG EKG Interpretation  Date/Time:  Monday September 25 2020 14:46:53 EDT Ventricular Rate:  126 PR Interval:    QRS Duration: 102 QT Interval:  345 QTC Calculation: 450 R Axis:   50 Text Interpretation: Sinus tachycardia Multiform ventricular premature complexes RSR' in V1 or V2, right VCD or RVH Confirmed by Sherwood Gambler [phone]) on 09/25/2020 3:05:26 PM   Radiology DG Chest  Portable 1 View  Result Date: 09/25/2020 CLINICAL DATA:  Arrhythmia, tachycardia. EXAM: PORTABLE CHEST 1 VIEW COMPARISON:  January 29, 2006 FINDINGS: The heart size and mediastinal contours are within normal limits. A radiopaque stimulator device is seen overlying the upper left lung. A radiopaque patent foramina ovale closure  device is also seen. Both lungs are clear. The visualized skeletal structures are unremarkable. IMPRESSION: No active disease. Electronically Signed   By: Virgina Norfolk M.D.   On: 09/25/2020 15:15    Procedures Procedures (including critical care time)  Medications Ordered in ED Medications  magnesium oxide (MAG-OX) tablet 400 mg (has no administration in time range)  metoprolol succinate (TOPROL-XL) 24 hr tablet 25 mg (has no administration in time range)  acetaminophen (TYLENOL) tablet 1,000 mg (has no administration in time range)  aspirin EC tablet 81 mg (has no administration in time range)  metoprolol succinate (TOPROL-XL) 24 hr tablet 50 mg (has no administration in time range)  ALPRAZolam (XANAX) tablet 0.5 mg (has no administration in time range)  pantoprazole (PROTONIX) EC tablet 40 mg (has no administration in time range)  clopidogrel (PLAVIX) tablet 75 mg (has no administration in time range)  loratadine (CLARITIN) tablet 10 mg (has no administration in time range)  heparin injection 5,000 Units (has no administration in time range)  sodium chloride flush (NS) 0.9 % injection 3 mL (has no administration in time range)  sodium chloride flush (NS) 0.9 % injection 3 mL (has no administration in time range)  0.9 %  sodium chloride infusion (has no administration in time range)  polyvinyl alcohol (LIQUIFILM TEARS) 1.4 % ophthalmic solution 1 drop (has no administration in time range)  sodium chloride 0.9 % bolus 1,000 mL (1,000 mLs Intravenous New Bag/Given 09/25/20 1514)  potassium chloride SA (KLOR-CON) CR tablet 40 mEq (40 mEq Oral Given 09/25/20 1731)  magnesium sulfate IVPB 2 g 50 mL (2 g Intravenous New Bag/Given 09/25/20 1731)    ED Course  I have reviewed the triage vital signs and the nursing notes.  Pertinent labs & imaging results that were available during my care of the patient were reviewed by me and considered in my medical decision making (see chart for  details).  Clinical Course as of Sep 25 1850  Mon Sep 25, 2020  1618 Spoke with Daleen Snook, cardiology. States they will come see the patient.   [SJ]    Clinical Course User Index [SJ] Yousuf Ager, Helane Gunther, PA-C   MDM Rules/Calculators/A&P                          Patient presents with palpitations.  Intermittent tachycardia with bigeminy. No noted hypotension, pallor, altered mental status.  I personally reviewed and interpreted the patient's labs and imaging studies. Troponin negative. Very mild hypokalemia.  Magnesium within normal range. Patient admitted via cardiology service.  Findings and plan of care discussed with Sherwood Gambler, MD. Dr. Regenia Skeeter personally evaluated and examined this patient.  Vitals:   09/25/20 1530 09/25/20 1545 09/25/20 1600 09/25/20 1615  BP: 111/76 105/75 115/72 123/76  Pulse: 90 (!) 59 (!) 50   Resp: 19 (!) 22 (!) 27 16  Temp:      TempSrc:      SpO2: 96% 100% 100%   Weight:      Height:         Final Clinical Impression(s) / ED Diagnoses Final diagnoses:  Palpitations  PVC's (premature ventricular contractions)    Rx /  DC Orders ED Discharge Orders    None       Layla Maw 09/25/20 Darci Needle, MD 09/26/20 [phone]

## 2020-09-25 NOTE — ED Notes (Signed)
Lopressor held due to pts last several bps being on the low end of normal at 106/78. Patient also states she does not feel comfortable taking the med with her BPs so soft. MD notified, no response received. Floor RN notified.

## 2020-09-26 ENCOUNTER — Telehealth: Payer: Self-pay | Admitting: *Deleted

## 2020-09-26 ENCOUNTER — Observation Stay (HOSPITAL_BASED_OUTPATIENT_CLINIC_OR_DEPARTMENT_OTHER): Payer: No Typology Code available for payment source

## 2020-09-26 DIAGNOSIS — R42 Dizziness and giddiness: Secondary | ICD-10-CM

## 2020-09-26 DIAGNOSIS — R002 Palpitations: Secondary | ICD-10-CM

## 2020-09-26 DIAGNOSIS — R008 Other abnormalities of heart beat: Secondary | ICD-10-CM

## 2020-09-26 LAB — BASIC METABOLIC PANEL
Anion gap: 7 (ref 5–15)
BUN: 10 mg/dL (ref 6–20)
CO2: 22 mmol/L (ref 22–32)
Calcium: 8.3 mg/dL — ABNORMAL LOW (ref 8.9–10.3)
Chloride: 110 mmol/L (ref 98–111)
Creatinine, Ser: 0.6 mg/dL (ref 0.44–1.00)
GFR calc Af Amer: >60 mL/min (ref >60)
GFR calc non Af Amer: >60 mL/min (ref >60)
Glucose, Bld: 95 mg/dL (ref 70–99)
Potassium: 3.8 mmol/L (ref 3.5–5.1)
Sodium: 139 mmol/L (ref 135–145)

## 2020-09-26 LAB — ECHOCARDIOGRAM COMPLETE
Area-P 1/2: 3.72 cm2
Calc EF: 63.8 %
Height: 64 in
S' Lateral: 3.5 cm
Single Plane A2C EF: 61.7 %
Single Plane A4C EF: 66 %
Weight: 2641.6 oz

## 2020-09-26 LAB — MAGNESIUM: Magnesium: 2.2 mg/dL (ref 1.7–2.4)

## 2020-09-26 LAB — VITAMIN D 25 HYDROXY (VIT D DEFICIENCY, FRACTURES): Vit D, 25-Hydroxy: 37.57 ng/mL (ref 30–100)

## 2020-09-26 MED ORDER — SODIUM CHLORIDE 0.9 % IV BOLUS
500.0000 mL | Freq: Once | INTRAVENOUS | Status: AC
  Administered 2020-09-26: 500 mL via INTRAVENOUS

## 2020-09-26 MED ORDER — METOPROLOL SUCCINATE ER 25 MG PO TB24
25.0000 mg | ORAL_TABLET | Freq: Two times a day (BID) | ORAL | Status: DC
  Administered 2020-09-26: 25 mg via ORAL
  Filled 2020-09-26: qty 1

## 2020-09-26 MED ORDER — METOPROLOL SUCCINATE ER 25 MG PO TB24: 25.0000 mg | ORAL_TABLET | Freq: Two times a day (BID) | ORAL | 3 refills | Status: DC

## 2020-09-26 NOTE — Telephone Encounter (Signed)
Patient is currently at day four of wear time , has 10 days left remaining.  We are showing that the patient is nearing the threshold of maximum button presses... Once threshold reached, button presses will not be transmitted to [employer], however they will be stored on her patch an be provided on her final report.  Her monitor will continue to auto trigger so anything abnormal will still be relayed and transmitted depending on the severity.  We can send out a replacement monitor if its needed for the remaining wear time.  Please call us if you have any questions or are requesting a replacement monitor.  Our phone number is [phone] reference number is [id number].

## 2020-09-26 NOTE — Plan of Care (Signed)

## 2020-09-26 NOTE — Progress Notes (Signed)
  Echocardiogram 2D Echocardiogram has been performed.  Marissa Burnett 09/26/2020, 10:25 AM

## 2020-09-26 NOTE — Discharge Summary (Signed)
Discharge Summary    Patient ID: Marissa Burnett MRN: [id number]; DOB: 07/21/1986  Admit date: 09/25/2020 Discharge date: 09/26/2020  Primary Care Provider: [name], MD  Primary Cardiologist: Sherren Mocha, MD    Discharge Diagnoses    Active Problems:   Premature atrial contractions   Hypokalemia  S/p ASD closure  Diagnostic Studies/Procedures   Echo 09/26/20 1. Left ventricular ejection fraction, by estimation, is 60 to 65%. The  left ventricle has normal function. The left ventricle has no regional  wall motion abnormalities. Left ventricular diastolic parameters were  normal.  2. Right ventricular systolic function is normal. The right ventricular  size is normal.  3. The mitral valve is normal in structure. No evidence of mitral valve  regurgitation.  4. The aortic valve is tricuspid. Aortic valve regurgitation is not  visualized.  5. The inferior vena cava is normal in size with greater than 50%  respiratory variability, suggesting right atrial pressure of 3 mmHg.   Conclusion(s)/Recommendation(s): Normal biventricular function without  evidence of hemodynamically significant valvular heart disease.    History of Present Illness     Marissa Burnett is a 34 y.o. female with migraine with aura, L MCA stroke in 06/2020, found to have secundum ASD s/p subsequent closure 9/1/21presented for palpitations.   Presentedin 7/2021with visual aura along with expressive aphasia, hand clumsiness and numbness. She was found to have 2-3 punctate ischemic infarcts in the left MCA territory and was sent to the emergency room.Stroke evaluation otherwise showed no evidence of large vessel vascular disease.LV function was normal by echo assessment and there was no valvular disease. Hypercoagulable panel was negative.She underwent a transcranial Doppler study thatwassuggestive of PFO.She underwent an outpatient transesophageal echo by Dr. Aundra Dubin on August 04, 2020 and  was found to have normal LV size and function with an LVEF of 60 to 65%. The RV was felt to be borderline dilated with normal RV systolic function. There was a secundum ASD present with left-to-right flow. She subsequently underwenttranscatheter ASD closure using an 18 mm Amplatzer septal occluder deviceon 08/30/20.   Recently called for intermittent palpitations. She submitted a copy of her Apple watch/Kardia tracings to MyChart which showed NSR withsuspectedPVCs (cannot exclude PACs with abberrant conduction). A Ziomonitor was mailedto her house.Toprol XL 25mg  daily was called in.  She came to ER 09/25/20 with worsening palpitations. She felt pale/diaphoretic so took her metoprolol and Xanax and was transported to the ER. In the ER,  EKG showed sinus tachycardia 126bpm with what Dr. Audie Box feels are frequent PACs with Ashman's phenomenon (no compensatory pause). CXR NAD. Troponin <2, CBC wnl, hypokalemia of 3.4, Mg 1.8, Cr wnl.   Hospital Course     Consultants: None  She was admitted to hospital for symptomatic palpitation showing PAC/PVC concerning for asthma and complex in setting of recent ASD closure.  Increased home metoprolol succinate to 25mg  BID.  TSH and Free T4 were normal. Normal vit D. Given supplement of K of 3.4.  Palpitation resolved.  Heart rate stable.  Advised to increase potassium supplement in diet or trial of V8 juice.  She was drinking 1 cup of coffee every day I have recommended to hold for few days to see response.  Echocardiogram with preserved LV function. She will complete her monitor.   Did the patient have an acute coronary syndrome (MI, NSTEMI, STEMI, etc) this admission?:  No  Did the patient have a percutaneous coronary intervention (stent / angioplasty)?:  No.    Discharge Vitals Blood pressure 108/75, pulse 84, temperature 98.8 F (37.1 C), temperature source Oral, resp. rate 16, height 5\' 4"  (1.626 m), weight 74.9 kg, last  menstrual period 09/24/2020, SpO2 98 %.  Filed Weights   09/25/20 1447 09/26/20 0557  Weight: 71 kg 74.9 kg    Labs & Radiologic Studies    CBC Recent Labs    09/25/20 1440  WBC 7.4  HGB 13.7  HCT 42.0  MCV 92.5  PLT 875   Basic Metabolic Panel Recent Labs    09/25/20 1440 09/26/20 0238  NA 138 139  K 3.4* 3.8  CL 103 110  CO2 21* 22  GLUCOSE 108* 95  BUN 12 10  CREATININE 0.74 0.60  CALCIUM 9.6 8.3*  MG 1.8 2.2   High Sensitivity Troponin:   Recent Labs  Lab 09/25/20 1440 09/25/20 1700  TROPONINIHS <2 <2    Thyroid Function Tests Recent Labs    09/25/20 1733  TSH 0.576   _____________  CARDIAC CATHETERIZATION  Result Date: 08/30/2020 Successful transcatheter ASD closure using an 18 mm Amplatzer septal occluder device with fluoroscopic and intracardiac echo guidance  DG Chest Portable 1 View  Result Date: 09/25/2020 CLINICAL DATA:  Arrhythmia, tachycardia. EXAM: PORTABLE CHEST 1 VIEW COMPARISON:  January 29, 2006 FINDINGS: The heart size and mediastinal contours are within normal limits. A radiopaque stimulator device is seen overlying the upper left lung. A radiopaque patent foramina ovale closure device is also seen. Both lungs are clear. The visualized skeletal structures are unremarkable. IMPRESSION: No active disease. Electronically Signed   By: Virgina Norfolk M.D.   On: 09/25/2020 15:15   ECHOCARDIOGRAM COMPLETE  Result Date: 09/26/2020    ECHOCARDIOGRAM REPORT   Patient Name:   Marissa Burnett Date of Exam: 09/26/2020 Medical Rec #:  [id number]        Height:       64.0 in Accession #:    [id number]       Weight:       165.1 lb Date of Birth:  1986/07/11        BSA:          1.803 m Patient Age:    34 years         BP:           110/78 mmHg Patient Gender: F                HR:           76 bpm. Exam Location:  Inpatient Procedure: 2D Echo, Cardiac Doppler and Color Doppler Indications:    Palpitations 785.1 / R00.2  History:        Patient has prior  history of Echocardiogram examinations, most                 recent 08/30/2020. Stroke; Risk Factors:Non-Smoker. GERD. ASD                 closure 08/30/20.  Sonographer:    Vickie Epley RDCS Referring Phys: [id number] [place] Comments: Image acquisition challenging due to breast implants. IMPRESSIONS  1. Left ventricular ejection fraction, by estimation, is 60 to 65%. The left ventricle has normal function. The left ventricle has no regional wall motion abnormalities. Left ventricular diastolic parameters were normal.  2. Right ventricular systolic function is normal. The right ventricular size is normal.  3.  The mitral valve is normal in structure. No evidence of mitral valve regurgitation.  4. The aortic valve is tricuspid. Aortic valve regurgitation is not visualized.  5. The inferior vena cava is normal in size with greater than 50% respiratory variability, suggesting right atrial pressure of 3 mmHg. Conclusion(s)/Recommendation(s): Normal biventricular function without evidence of hemodynamically significant valvular heart disease. FINDINGS  Left Ventricle: Left ventricular ejection fraction, by estimation, is 60 to 65%. The left ventricle has normal function. The left ventricle has no regional wall motion abnormalities. The left ventricular internal cavity size was normal in size. There is  no left ventricular hypertrophy. Left ventricular diastolic parameters were normal. Right Ventricle: The right ventricular size is normal. No increase in right ventricular wall thickness. Right ventricular systolic function is normal. Left Atrium: Left atrial size was normal in size. Right Atrium: Right atrial size was normal in size. Pericardium: There is no evidence of pericardial effusion. Mitral Valve: The mitral valve is normal in structure. No evidence of mitral valve regurgitation. Tricuspid Valve: The tricuspid valve is grossly normal. Tricuspid valve regurgitation is not demonstrated. Aortic Valve: The  aortic valve is tricuspid. Aortic valve regurgitation is not visualized. Pulmonic Valve: The pulmonic valve was grossly normal. Pulmonic valve regurgitation is not visualized. Aorta: The aortic root and ascending aorta are structurally normal, with no evidence of dilitation. Venous: The inferior vena cava is normal in size with greater than 50% respiratory variability, suggesting right atrial pressure of 3 mmHg. IAS/Shunts: No atrial level shunt detected by color flow Doppler.  LEFT VENTRICLE PLAX 2D LVIDd:         4.60 cm     Diastology LVIDs:         3.50 cm     LV e' medial:    8.92 cm/s LV PW:         0.60 cm     LV E/e' medial:  7.6 LV IVS:        0.60 cm     LV e' lateral:   17.70 cm/s LVOT diam:     1.90 cm     LV E/e' lateral: 3.8 LV SV:         56 LV SV Index:   31 LVOT Area:     2.84 cm  LV Volumes (MOD) LV vol d, MOD A2C: 95.3 ml LV vol d, MOD A4C: 92.7 ml LV vol s, MOD A2C: 36.5 ml LV vol s, MOD A4C: 31.5 ml LV SV MOD A2C:     58.8 ml LV SV MOD A4C:     92.7 ml LV SV MOD BP:      60.7 ml RIGHT VENTRICLE TAPSE (M-mode): 1.7 cm LEFT ATRIUM             Index       RIGHT ATRIUM           Index LA diam:        3.60 cm 2.00 cm/m  RA Area:     11.70 cm LA Vol (A2C):   26.7 ml 14.81 ml/m RA Volume:   25.60 ml  14.20 ml/m LA Vol (A4C):   36.9 ml 20.46 ml/m LA Biplane Vol: 32.3 ml 17.91 ml/m  AORTIC VALVE LVOT Vmax:   106.00 cm/s LVOT Vmean:  69.300 cm/s LVOT VTI:    0.197 m  AORTA Ao Root diam: 2.90 cm MITRAL VALVE MV Area (PHT): 3.72 cm    SHUNTS MV Decel Time: 204 msec    Systemic VTI:  0.20 m MV E velocity: 67.70 cm/s  Systemic Diam: 1.90 cm MV A velocity: 56.60 cm/s MV E/A ratio:  1.20 Lyman Bishop MD Electronically signed by Lyman Bishop MD Signature Date/Time: 09/26/2020/10:33:03 AM    Final    ECHOCARDIOGRAM LIMITED  Result Date: 08/30/2020    ECHOCARDIOGRAM LIMITED REPORT   Patient Name:   Marissa Burnett Date of Exam: 08/30/2020 Medical Rec #:  [id number]        Height:       64.0 in Accession  #:    [id number]       Weight:       158.4 lb Date of Birth:  [birth year]        BSA:          1.772 m Patient Age:    34 years         BP:           115/71 mmHg Patient Gender: F                HR:           98 bpm. Exam Location:  Inpatient Procedure: Limited Echo and Color Doppler Indications:    s/p ASD closure  History:        Patient has prior history of Echocardiogram examinations, most                 recent 08/04/2020. Stroke; Signs/Symptoms:ASD.  Sonographer:    Dustin Flock Referring Phys: [phone] MICHAEL COOPER                  Limited study after ASD septal device closure.                 Amplatzer septal occluder appears well deployed and stable.                 No evidence of residualinteratrial shunt by collor Doppler                 Saline contrast study was not performed.                 No pericardial effusion.                 The inferior vena cava is not dilated.                 Normal left ventricular systolic function and regional wall                 motion.  LEFT VENTRICLE PLAX 2D LVIDd:         4.40 cm LVIDs:         3.50 cm LV PW:         1.00 cm LV IVS:        0.90 cm  Dani Gobble Croitoru MD Electronically signed by Sanda Klein MD Signature Date/Time: 08/30/2020/12:59:45 PM    Final    Disposition   Pt is being discharged home today in good condition.  Follow-up Plans & Appointments     Follow-up Information    [place], Crista Luria, [place] Follow up on 10/31/2020.   Specialty: Cardiology Why: @ 10:15am for virtual video visit Contact information: [address] [place] [address] [phone]              Discharge Instructions    Diet - low sodium heart healthy   Complete by: As directed    Increase activity slowly  Complete by: As directed       Discharge Medications   Allergies as of 09/26/2020      Reactions   Peanut-containing Drug Products Anaphylaxis   THROAT GETS TIGHT, peanut butter      Medication List    TAKE these medications     acetaminophen 500 MG tablet Commonly known as: TYLENOL Take 1,000 mg by mouth daily as needed for headache.   ALPRAZolam 0.5 MG tablet Commonly known as: XANAX TAKE 1 TABLET(0.5 MG) BY MOUTH TWICE DAILY AS NEEDED FOR ANXIETY   amoxicillin 500 MG capsule Commonly known as: AMOXIL Take 4 capsules (2,000 mg total) by mouth once as needed for up to 1 dose (take one hour prior to procedures such as dental cleaning).   aspirin 81 MG EC tablet Take 1 tablet (81 mg total) by mouth daily. Swallow whole.   BIOTIN PO Take 1 tablet by mouth daily.   clopidogrel 75 MG tablet Commonly known as: PLAVIX Take 75 mg by mouth daily.   Emergen-C Vitamin C Pack Take 1 packet by mouth daily.   EPINEPHrine 0.3 mg/0.3 mL Soaj injection Commonly known as: EPI-PEN Inject 0.3 mLs (0.3 mg total) into the muscle once as needed for up to 1 dose for anaphylaxis.   loratadine 10 MG tablet Commonly known as: CLARITIN Take 10 mg by mouth daily.   metoprolol succinate 25 MG 24 hr tablet Commonly known as: Toprol XL Take 1 tablet (25 mg total) by mouth 2 (two) times daily. Take with or immediately following a meal. What changed: when to take this   pantoprazole 40 MG tablet Commonly known as: PROTONIX TAKE 1 TABLET (40 MG TOTAL) BY MOUTH DAILY.   REFRESH OP Place 1 drop into both eyes daily as needed (dry eyes).          Outstanding Labs/Studies   BMET  Duration of Discharge Encounter   Greater than 30 minutes including physician time.  Jarrett Soho, PA 09/26/2020, 11:44 AM

## 2020-09-26 NOTE — Telephone Encounter (Signed)
No replacement needed. We will just wait for the final report.

## 2020-09-27 ENCOUNTER — Ambulatory Visit: Payer: No Typology Code available for payment source | Admitting: Physician Assistant

## 2020-09-27 ENCOUNTER — Other Ambulatory Visit (HOSPITAL_COMMUNITY): Payer: No Typology Code available for payment source

## 2020-09-27 ENCOUNTER — Other Ambulatory Visit: Payer: Self-pay | Admitting: Physician Assistant

## 2020-09-27 DIAGNOSIS — I491 Atrial premature depolarization: Secondary | ICD-10-CM

## 2020-09-27 DIAGNOSIS — I493 Ventricular premature depolarization: Secondary | ICD-10-CM

## 2020-09-28 ENCOUNTER — Telehealth: Payer: Self-pay | Admitting: Medical

## 2020-09-28 DIAGNOSIS — R002 Palpitations: Secondary | ICD-10-CM

## 2020-09-28 MED ORDER — DILTIAZEM HCL 30 MG PO TABS: 30.0000 mg | ORAL_TABLET | Freq: Three times a day (TID) | ORAL | 1 refills | Status: DC

## 2020-09-28 NOTE — Telephone Encounter (Signed)
I think this is a great idea. She really has had a tough time with this.   [place] T. Audie Box, [place]  [address], [place], [place] [address] [phone]  7:38 PM

## 2020-09-28 NOTE — Telephone Encounter (Signed)
Patient called to report she is still having palpitations and episodes of SVT. She was discharged 9/28 with metoprolol 50 mg BID and now has Zio monitor. BB  made the patient feel generally horrible. Resting heart rate in the 50s and down to 44bpm overnight. She was unable to tolerate the dose of metoprolol so she decreased it to 25mg  BID and she immediately felt better the next day. However felt palpitations were the same. Now feeling pulsating in her ear and it is very uncomfortable. SVT episodes lasting 1-5 minutes with HR 120-130s. She has an apt next week with EP but cannot wait since she feels so uncomfortable. Spoke with Dr. Sallyanne Kuster who recommended Cardizem 30mg  TID. Will send in rx to pharmacy. Continue BB. Patient voiced understanding  Marissa Burnett. PA-C

## 2020-09-29 NOTE — Telephone Encounter (Signed)
Advised will continue to monitor for now, will not send another monitor. Pt reports continued "SVT episodes", heart racing (not currently).   She is worried about taking Diltiazem b/c her BP already runs on the lower side, 110/70.  Pt advised that 30 mg dose should not cause enough of a drop to her BP to cause SE, but to call office if any issues occur after taking.  Aware to also call if not effective. Advised to keep consult appt Tuesday. Available monitor report printed and placed in Dr. Mardene Speak box for upcoming appt.  Nurse aware

## 2020-09-29 NOTE — Telephone Encounter (Signed)
Patient called back stating that nobody has gotten back with her regarding troubles with the zio monitor and about the medication. Please call.

## 2020-10-03 ENCOUNTER — Other Ambulatory Visit: Payer: Self-pay | Admitting: *Deleted

## 2020-10-03 ENCOUNTER — Encounter: Payer: Self-pay | Admitting: Cardiology

## 2020-10-03 ENCOUNTER — Ambulatory Visit (INDEPENDENT_AMBULATORY_CARE_PROVIDER_SITE_OTHER): Payer: No Typology Code available for payment source | Admitting: Cardiology

## 2020-10-03 ENCOUNTER — Other Ambulatory Visit: Payer: Self-pay

## 2020-10-03 VITALS — BP 114/70 | HR 94 | Ht 64.0 in | Wt 160.0 lb

## 2020-10-03 DIAGNOSIS — Q211 Atrial septal defect, unspecified: Secondary | ICD-10-CM

## 2020-10-03 DIAGNOSIS — I491 Atrial premature depolarization: Secondary | ICD-10-CM | POA: Diagnosis not present

## 2020-10-03 NOTE — Progress Notes (Signed)
Electrophysiology Office Note:    Date:  10/03/2020   ID:  Marissa Burnett, DOB Mar 11, 1986, MRN [id number]  PCP:  [name], MD  [facility] Cardiologist:  Sherren Mocha, MD  [facility] Electrophysiologist:  None   Referring MD: Eileen Stanford, PA*   Chief Complaint: Symptomatic PACs  History of Present Illness:    Marissa Burnett is a 34 y.o. female who presents for an evaluation of symptomatic PACs at the request of Angelena Form PA-C. Their medical history includes a secundum ASD status post ASD closure on August 30, 2020, left MCA stroke in July 2021.   She was then admitted September 27 through September 28 for palpitations.  During the hospitalization she was found to have frequent PACs with aberrant conduction.  She was discharged with a Zio patch which I have reviewed and it shows the same.  No evidence of atrial fibrillation on the available ZIO strips.  She was initially prescribed metoprolol but takes this at 12.5 mg twice daily.  She does not take the diltiazem.  For the past 2 days she has had a reduced burden of PACs.  She said when she was on a higher dose of the metoprolol, she would experience nocturnal heart rates in the 40s which she was concerned about.    Past Medical History:  Diagnosis Date  . Abnormal Papanicolaou smear of cervix 10/15/2016   LSIL+HPV  . Allergy   . Anxiety   . ASD (atrial septal defect) 07/28/2020  . Chronic lumbar pain 09/27/2018   Patient has seen Dr. Arnoldo Morale neurosurgery September 2019-they do not feel there is anything surgical that can help-they are recommending injections with pain medicine specialist  . Gallstones   . GERD (gastroesophageal reflux disease)   . Heart murmur birth  . Migraine   . Stroke ([place]) 07/12/2020   a. L MCA stroke in 06/2020.    Past Surgical History:  Procedure Laterality Date  . ABDOMINOPLASTY    . ATRIAL SEPTAL DEFECT(ASD) CLOSURE N/A 08/30/2020   Procedure: ATRIAL SEPTAL  DEFECT (ASD) CLOSURE;  Surgeon: Sherren Mocha, MD;  Location: [facility];  Service: Cardiovascular;  Laterality: N/A;  . BREAST ENHANCEMENT SURGERY    . CERVICAL CONIZATION W/BX N/A 12/13/2016   Procedure: CONIZATION CERVIX WITH BIOPSY--cold knife conization;  Surgeon: Jonnie Kind, MD;  Location: [facility];  Service: Gynecology;  Laterality: N/A;  . CHOLECYSTECTOMY  02/2002  . ENDOSCOPIC RETROGRADE CHOLANGIOPANCREATOGRAPHY (ERCP) WITH PROPOFOL    . OTHER SURGICAL HISTORY     stent in kidney during pregnancy,2002; removed in 2003  . TEE WITHOUT CARDIOVERSION N/A 08/04/2020   Procedure: TRANSESOPHAGEAL ECHOCARDIOGRAM (TEE);  Surgeon: Larey Dresser, MD;  Location: [facility];  Service: Cardiovascular;  Laterality: N/A;    Current Medications: Current Meds  Medication Sig  . acetaminophen (TYLENOL) 500 MG tablet Take 1,000 mg by mouth daily as needed for headache.   . ALPRAZolam (XANAX) 0.5 MG tablet TAKE 1 TABLET(0.5 MG) BY MOUTH TWICE DAILY AS NEEDED FOR ANXIETY  . amoxicillin (AMOXIL) 500 MG capsule Take 4 capsules (2,000 mg total) by mouth once as needed for up to 1 dose (take one hour prior to procedures such as dental cleaning).  Marland Kitchen aspirin EC 81 MG EC tablet Take 1 tablet (81 mg total) by mouth daily. Swallow whole.  Marland Kitchen BIOTIN PO Take 1 tablet by mouth daily.   . clopidogrel (PLAVIX) 75 MG tablet Take 75 mg by mouth daily.  Marland Kitchen EPINEPHrine  0.3 mg/0.3 mL IJ SOAJ injection Inject 0.3 mLs (0.3 mg total) into the muscle once as needed for up to 1 dose for anaphylaxis.  Marland Kitchen loratadine (CLARITIN) 10 MG tablet Take 10 mg by mouth daily.  . metoprolol succinate (TOPROL-XL) 25 MG 24 hr tablet Take 25 mg by mouth in the morning and at bedtime.  . Multiple Vitamins-Minerals (EMERGEN-C VITAMIN C) PACK Take 1 packet by mouth daily.  . pantoprazole (PROTONIX) 40 MG tablet TAKE 1 TABLET (40 MG TOTAL) BY MOUTH DAILY.  . Polyvinyl Alcohol-Povidone (REFRESH OP) Place 1 drop into both eyes daily as  needed (dry eyes).  . [DISCONTINUED] diltiazem (CARDIZEM) 30 MG tablet Take 1 tablet (30 mg total) by mouth 3 (three) times daily.     Allergies:   Peanut-containing drug products   Social History   Socioeconomic History  . Marital status: Married    Spouse name: Not on file  . Number of children: 2  . Years of education: Not on file  . Highest education level: Not on file  Occupational History  . Occupation: resp therapist    Employer: [employer]  Tobacco Use  . Smoking status: Never Smoker  . Smokeless tobacco: Never Used  Vaping Use  . Vaping Use: Never used  Substance and Sexual Activity  . Alcohol use: Not Currently  . Drug use: No  . Sexual activity: Yes    Birth control/protection: None  Other Topics Concern  . Not on file  Social History Narrative   Married, respiratory therapist [facility]   2 children   Occasional alcohol, never smoker no drug use   Previous work Personnel officer   Social Determinants of Radio broadcast assistant Strain:   . Difficulty of Paying Living Expenses: Not on file  Food Insecurity:   . Worried About Charity fundraiser in the Last Year: Not on file  . Ran Out of Food in the Last Year: Not on file  Transportation Needs:   . Lack of Transportation (Medical): Not on file  . Lack of Transportation (Non-Medical): Not on file  Physical Activity:   . Days of Exercise per Week: Not on file  . Minutes of Exercise per Session: Not on file  Stress:   . Feeling of Stress : Not on file  Social Connections:   . Frequency of Communication with Friends and Family: Not on file  . Frequency of Social Gatherings with Friends and Family: Not on file  . Attends Religious Services: Not on file  . Active Member of Clubs or Organizations: Not on file  . Attends Archivist Meetings: Not on file  . Marital Status: Not on file     Family History: The patient's family history includes ADD / ADHD in her  daughter; Anxiety disorder in her daughter; COPD in her maternal grandfather; Colon polyps in her paternal grandmother; Diabetes in her maternal grandmother and another family member; Heart disease in her paternal grandmother; Irritable bowel syndrome in her paternal grandmother; Lung cancer in her paternal grandfather. There is no history of Allergic rhinitis, Angioedema, Asthma, Atopy, Eczema, Immunodeficiency, or Urticaria.  ROS:   Please see the history of present illness.    All other systems reviewed and are negative.  EKGs/Labs/Other Studies Reviewed:    The following studies were reviewed today: Echo, ECG, ZIO  September 24 ZIO report personally reviewed Presenting rhythm is sinus.  Frequent PACs with aberrant conduction, sometimes in a bigeminal pattern.  These correspond to episodes that were patient triggered.  September 26, 2020 echo personally reviewed Well-seated atrial septal occluder device.  Left ventricular function normal.  RV appears to be mildly dilated with normal function.  No significant valvular abnormalities.  No pericardial effusion.  EKG:  The ekg ordered today demonstrates sinus rhythm.  Incomplete right bundle branch block pattern.  Normal axis.  Recent Labs: 07/27/2020: ALT 13 09/25/2020: Hemoglobin 13.7; Platelets 230; TSH 0.576 09/26/2020: BUN 10; Creatinine, Ser 0.60; Magnesium 2.2; Potassium 3.8; Sodium 139  Recent Lipid Panel    Component Value Date/Time   CHOL 135 07/28/2020 0351   CHOL 146 10/14/2018 1026   TRIG 46 07/28/2020 0351   HDL 57 07/28/2020 0351   HDL 64 10/14/2018 1026   CHOLHDL 2.4 07/28/2020 0351   VLDL 9 07/28/2020 0351   LDLCALC 69 07/28/2020 0351   LDLCALC 69 10/14/2018 1026    Physical Exam:    VS:  BP 114/70   Pulse 94   Ht 5\' 4"  (1.626 m)   Wt 160 lb (72.6 kg)   LMP 09/24/2020   SpO2 98%   BMI 27.46 kg/m     Wt Readings from Last 3 Encounters:  10/03/20 160 lb (72.6 kg)  09/26/20 165 lb 1.6 oz (74.9 kg)  08/30/20  158 lb 6.4 oz (71.8 kg)     GEN:  Well nourished, well developed in no acute distress HEENT: Normal NECK: No JVD; No carotid bruits LYMPHATICS: No lymphadenopathy CARDIAC: RRR, no murmurs, rubs, gallops RESPIRATORY:  Clear to auscultation without rales, wheezing or rhonchi  ABDOMEN: Soft, non-tender, non-distended MUSCULOSKELETAL:  No edema; No deformity  SKIN: Warm and dry NEUROLOGIC:  Alert and oriented x 3 PSYCHIATRIC:  Normal affect   ASSESSMENT:    1. PAC (premature atrial contraction)   2. ASD (atrial septal defect)    PLAN:    In order of problems listed above:  1. PACs Symptomatic.  Related to recent ASD closure device implant.  Recommend that we simplify her medication regimen by eliminating the Cardizem.  Would recommend she continue the Toprol-XL 12.5 mg p.o. twice daily.  If she is having a particularly bad day with increased burden of PACs, recommend she increase the metoprolol to 25 mg p.o. twice daily.  2. ASDs Patient status post recent closure of the ASD by Dr. Burt Knack.  Echo shows a well-placed septal occluder device.    Follow-up as needed  Medication Adjustments/Labs and Tests Ordered: Current medicines are reviewed at length with the patient today.  Concerns regarding medicines are outlined above.  Orders Placed This Encounter  Procedures  . EKG 12-Lead   No orders of the defined types were placed in this encounter.    Signed, Lars Mage, MD, [facility]  10/03/2020 2:25 PM    Electrophysiology [facility]

## 2020-10-03 NOTE — Patient Instructions (Addendum)
Medication Instructions:  Your physician has recommended you make the following change in your medication:   1.  STOP diltiazem  Lab Work: None ordered. If you have labs (blood work) drawn today and your tests are completely normal, you will receive your results only by: Marland Kitchen MyChart Message (if you have MyChart) OR . A paper copy in the mail If you have any lab test that is abnormal or we need to change your treatment, we will call you to review the results.  Testing/Procedures: None ordered.  Follow-Up: At [facility], you and your health needs are our priority.  As part of our continuing mission to provide you with exceptional heart care, we have created designated Provider Care Teams.  These Care Teams include your primary Cardiologist (physician) and Advanced Practice Providers (APPs -  Physician Assistants and Nurse Practitioners) who all work together to provide you with the care you need, when you need it.  Your next appointment:   Your physician wants you to follow-up in: as needed with Dr. Quentin Ore.

## 2020-10-03 NOTE — Patient Outreach (Signed)
[place] [facility]) Care Management  10/03/2020  Marissa Burnett 31-Mar-1986 [id number]   Transition of care call/case closure   Referral received:10/02/20 Initial outreach:10/03/20 Insurance: [place]   Initial unsuccessful telephone call to patient's preferred number in order to complete transition of care assessment; no answer, left HIPAA compliant voicemail message requesting return call.   Objective: Marissa Burnett was hospitalized at [facility] on 9/27 to 09/26/20 for palpitations, Premature atrial contractions,  Comorbidities include: S/P Atrial Septal Defect closure , Left MCA stroke 06/2020, Migraines.  She  was discharged to home on 09/26/20 without the need for home health services or DME.   Plan: This RNCM will route unsuccessful outreach letter with [place] Management pamphlet and 24 hour Nurse Advice Line Magnet to [place] Management clinical pool to be mailed to patient's home address. This RNCM will attempt another outreach within 4 business days.  Joylene Draft, RN, BSN  [place] Management Coordinator  [phone]- Mobile [phone]- Toll Free Main Office

## 2020-10-04 ENCOUNTER — Telehealth: Payer: Self-pay

## 2020-10-04 DIAGNOSIS — Q211 Atrial septal defect, unspecified: Secondary | ICD-10-CM

## 2020-10-04 NOTE — Telephone Encounter (Signed)
-----   Message from Sherren Mocha, MD sent at 10/04/2020 11:27 AM EDT ----- [name] can you set her up for a limited echo bubble study 6 months from implant date?

## 2020-10-04 NOTE — Telephone Encounter (Signed)
Limited bubble study ordered. Will arrange with patient for March 2022 (implant date 08/30/2020).

## 2020-10-06 ENCOUNTER — Other Ambulatory Visit: Payer: Self-pay | Admitting: *Deleted

## 2020-10-06 NOTE — Patient Outreach (Signed)
[place] [facility]) Care Management  10/06/2020  DEYSI SOLDO May 29, 1986 [id number]   Transition of care call Referral received: 10/02/20 Initial outreach attempt: 10/03/20 Insurance: [place]   2nd unsuccessful telephone call to patient's preferred contact number in order to complete post hospital discharge transition of care assessment , no answer left HIPAA compliant message requesting return call.    Objective: Mrs. Abend was hospitalized [facility] on 9/27 to 09/26/20 for palpitations, Premature atrial contractions,  Comorbidities include: Hx of  Atrial Septal Defect PFO closure  , Left MCA stroke 06/2020, Migraines.  She  was discharged to home on 09/26/20 without the need for home health servicesor DME.   Plan If no return call from patient will attempt 3rd outreach in the next 4 business days.   Joylene Draft, RN, BSN  [place] Management Coordinator  [phone]- Mobile [phone]- Toll Free Main Office

## 2020-10-09 ENCOUNTER — Encounter: Payer: Self-pay | Admitting: Family Medicine

## 2020-10-11 ENCOUNTER — Other Ambulatory Visit: Payer: Self-pay | Admitting: *Deleted

## 2020-10-11 ENCOUNTER — Ambulatory Visit: Payer: No Typology Code available for payment source | Admitting: Physician Assistant

## 2020-10-11 NOTE — Patient Outreach (Signed)
[place] [facility]) Care Management  10/11/2020  KENDRE SIRES [birth year] [id number]   Transition of care call Referral received: 10/02/20 Initial outreach attempt: 10/03/20 Insurance: [place]  Third unsuccessful telephone call to patient's preferred contact number in order to complete post hospital discharge transition of care assessment; no answer,voicemail box full unable to leave a message .   Objective: Per electronic record Mrs. Leeman was hospitalized [facility] on 9/27 to 09/26/20 for palpitations, Premature atrial contractions,  Comorbidities include: S/P Atrial Septal Defect closure , Left MCA stroke 06/2020, Migraines.  She  was discharged to home on 09/26/20 without the need for home health servicesor DME.   Plan: If no return call will plan 4th call attempt per work flow in the next 3 weeks.    Joylene Draft, RN, BSN  [place] Management Coordinator  [phone]- Mobile [phone]- Toll Free Main Office

## 2020-10-19 ENCOUNTER — Ambulatory Visit: Payer: No Typology Code available for payment source | Admitting: Neurology

## 2020-10-19 ENCOUNTER — Encounter: Payer: Self-pay | Admitting: Neurology

## 2020-10-19 VITALS — BP 129/88 | HR 98 | Ht 64.0 in | Wt 164.2 lb

## 2020-10-19 DIAGNOSIS — I639 Cerebral infarction, unspecified: Secondary | ICD-10-CM

## 2020-10-19 DIAGNOSIS — Q211 Atrial septal defect, unspecified: Secondary | ICD-10-CM

## 2020-10-19 DIAGNOSIS — G43109 Migraine with aura, not intractable, without status migrainosus: Secondary | ICD-10-CM

## 2020-10-19 DIAGNOSIS — G43009 Migraine without aura, not intractable, without status migrainosus: Secondary | ICD-10-CM

## 2020-10-19 MED FILL — ASPIRIN LOW DOSE 81 MG TBEC: 81 | 90 days supply | Qty: 90 | Fill #3

## 2020-10-19 MED FILL — CLOPIDOGREL 75 MG TABLET: 75 | 90 days supply | Qty: 90 | Fill #2

## 2020-10-19 NOTE — Patient Instructions (Signed)
I had a long d/w patient about her recent cryptogenic strokes, classic migraines and ASD closure, risk for recurrent stroke/TIAs, personally independently reviewed imaging studies and stroke evaluation results and answered questions.Continue aspirin 81 mg daily and clopidogrel 75 mg daily  for secondary stroke prevention  For 6 months post ASD closure and then aspirin alone and maintain strict control of hypertension with blood pressure goal below 130/90, diabetes with hemoglobin A1c goal below 6.5% and lipids with LDL cholesterol goal below 70 mg/dL. I also advised the patient to eat a healthy diet with plenty of whole grains, cereals, fruits and vegetables, exercise regularly and maintain ideal body weight.  Her migraines are not frequent enough at the present time to justify prophylaxis with Cardizem and I have advised her to avoid migraine triggers.  Followup in the future with me in 6 months or call earlier if needed.  Stroke Prevention Some medical conditions and behaviors are associated with a higher chance of having a stroke. You can help prevent a stroke by making nutrition, lifestyle, and other changes, including managing any medical conditions you may have. What nutrition changes can be made?   Eat healthy foods. You can do this by: ? Choosing foods high in fiber, such as fresh fruits and vegetables and whole grains. ? Eating at least 5 or more servings of fruits and vegetables a day. Try to fill half of your plate at each meal with fruits and vegetables. ? Choosing lean protein foods, such as lean cuts of meat, poultry without skin, fish, tofu, beans, and nuts. ? Eating low-fat dairy products. ? Avoiding foods that are high in salt (sodium). This can help lower blood pressure. ? Avoiding foods that have saturated fat, trans fat, and cholesterol. This can help prevent high cholesterol. ? Avoiding processed and premade foods.  Follow your health care provider's specific guidelines for  losing weight, controlling high blood pressure (hypertension), lowering high cholesterol, and managing diabetes. These may include: ? Reducing your daily calorie intake. ? Limiting your daily sodium intake to 1,500 milligrams (mg). ? Using only healthy fats for cooking, such as olive oil, canola oil, or sunflower oil. ? Counting your daily carbohydrate intake. What lifestyle changes can be made?  Maintain a healthy weight. Talk to your health care provider about your ideal weight.  Get at least 30 minutes of moderate physical activity at least 5 days a week. Moderate activity includes brisk walking, biking, and swimming.  Do not use any products that contain nicotine or tobacco, such as cigarettes and e-cigarettes. If you need help quitting, ask your health care provider. It may also be helpful to avoid exposure to secondhand smoke.  Limit alcohol intake to no more than 1 drink a day for nonpregnant women and 2 drinks a day for men. One drink equals 12 oz of beer, 5 oz of wine, or 1 oz of hard liquor.  Stop any illegal drug use.  Avoid taking birth control pills. Talk to your health care provider about the risks of taking birth control pills if: ? You are over 84 years old. ? You smoke. ? You get migraines. ? You have ever had a blood clot. What other changes can be made?  Manage your cholesterol levels. ? Eating a healthy diet is important for preventing high cholesterol. If cholesterol cannot be managed through diet alone, you may also need to take medicines. ? Take any prescribed medicines to control your cholesterol as told by your health care provider.  Manage  your diabetes. ? Eating a healthy diet and exercising regularly are important parts of managing your blood sugar. If your blood sugar cannot be managed through diet and exercise, you may need to take medicines. ? Take any prescribed medicines to control your diabetes as told by your health care provider.  Control your  hypertension. ? To reduce your risk of stroke, try to keep your blood pressure below 130/80. ? Eating a healthy diet and exercising regularly are an important part of controlling your blood pressure. If your blood pressure cannot be managed through diet and exercise, you may need to take medicines. ? Take any prescribed medicines to control hypertension as told by your health care provider. ? Ask your health care provider if you should monitor your blood pressure at home. ? Have your blood pressure checked every year, even if your blood pressure is normal. Blood pressure increases with age and some medical conditions.  Get evaluated for sleep disorders (sleep apnea). Talk to your health care provider about getting a sleep evaluation if you snore a lot or have excessive sleepiness.  Take over-the-counter and prescription medicines only as told by your health care provider. Aspirin or blood thinners (antiplatelets or anticoagulants) may be recommended to reduce your risk of forming blood clots that can lead to stroke.  Make sure that any other medical conditions you have, such as atrial fibrillation or atherosclerosis, are managed. What are the warning signs of a stroke? The warning signs of a stroke can be easily remembered as BEFAST.  B is for balance. Signs include: ? Dizziness. ? Loss of balance or coordination. ? Sudden trouble walking.  E is for eyes. Signs include: ? A sudden change in vision. ? Trouble seeing.  F is for face. Signs include: ? Sudden weakness or numbness of the face. ? The face or eyelid drooping to one side.  A is for arms. Signs include: ? Sudden weakness or numbness of the arm, usually on one side of the body.  S is for speech. Signs include: ? Trouble speaking (aphasia). ? Trouble understanding.  T is for time. ? These symptoms may represent a serious problem that is an emergency. Do not wait to see if the symptoms will go away. Get medical help right  away. Call your local emergency services (911 in the [place] Do not drive yourself to the hospital.  Other signs of stroke may include: ? A sudden, severe headache with no known cause. ? Nausea or vomiting. ? Seizure. Where to find more information For more information, visit:  American Stroke Association: www.strokeassociation.org  National Stroke Association: www.stroke.org Summary  You can prevent a stroke by eating healthy, exercising, not smoking, limiting alcohol intake, and managing any medical conditions you may have.  Do not use any products that contain nicotine or tobacco, such as cigarettes and e-cigarettes. If you need help quitting, ask your health care provider. It may also be helpful to avoid exposure to secondhand smoke.  Remember BEFAST for warning signs of stroke. Get help right away if you or a loved one has any of these signs. This information is not intended to replace advice given to you by your health care provider. Make sure you discuss any questions you have with your health care provider. Document Revised: 11/28/2017 Document Reviewed: 01/21/2017 Elsevier Patient Education  2020 [employer].

## 2020-10-19 NOTE — Progress Notes (Signed)
[facility] [address] [place]. [place] [address] [phone]       OFFICE CONSULT NOTE  Ms. Marissa Burnett Date of Birth:  06/13/1986 Medical Record Number:  [id number]   Referring MD: Rosalin Hawking  Reason for Referral: Stroke and migraine  HPI: Marissa Burnett is a 34 year old Caucasian lady seen today for initial office consultation visit for stroke and migraine.  History is obtained from the patient, review of electronic medical records and I personally reviewed available imaging films in PACS. She has no past medical history except for migraines who presented to [facility] emergency room on 07/27/2020 with report of an abnormal MRI scan.  She stated 2 weeks ago she had an episode where she became lightheaded and had binocular blurred vision and generalized weakness.  She tried to wash her hands with could not feel the water and she had some numbness in the hands.  This occurred while she was at work and her coworkers also noted that she looked pale.  She also had some word finding difficulties and trouble texting while using her phone.  She notices slight headache.  She does have prior history of ocular migraines in the past which involved mostly blurred vision as well as seeing zigzag lines.  She had done well and not had any of those for years.  However since July she has been having them at least once or twice a month.  MRI scan of the brain done as an outpatient showed 3 small diffusion positive cortical lesions in the left hemisphere and frontal and parietal lobes.  Spinal tap was obtained which was normal for protein glucose and inflammatory markers.  She underwent lab work for vasculitic states as well as hypercoagulable states and it was all normal.  Transcranial Doppler bubble study showed evidence of right-to-left shunt and TEE confirmed that to be ostium secundum atrial septal defect.  Lower extremity venous Dopplers were negative for DVT.  Patient subsequently had  elective closure of that by Dr. Burt Knack on 08/30/2020.  She was placed on aspirin and Plavix and return to the ER on 09/24/2020 with frequent PACs.  She was monitored for 2 days with no definite atrial fibrillation was found.  She wore a Zio patch as well which showed mostly ectopic beats.  She was placed on beta-blockers but developed severe bradycardia and she is in the process of tapering and stopping it.  She states she is still had 2 episodes of migraine since her ASD closure 1 on September 13 and 1 on October 2.  She sees zigzag lines and has extreme sensitivity to light and sound and vision gets a little blurred.  This lasted about 20 minutes.  She is tolerating aspirin Plavix without bruising or bleeding.  Blood pressure is well controlled today it is 129/88.  She is not on a statin and her LDL cholesterol was 69 mg percent in July and hemoglobin A1c was 4.6.  She has no new complaints today. ROS:   14 system review of systems is positive for headache, blurred vision, numbness, confusion all other systems negative PMH:  Past Medical History:  Diagnosis Date  . Abnormal Papanicolaou smear of cervix 10/15/2016   LSIL+HPV  . Allergy   . Anxiety   . ASD (atrial septal defect) 07/28/2020  . Chronic lumbar pain 09/27/2018   Patient has seen Dr. Arnoldo Morale neurosurgery September 2019-they do not feel there is anything surgical that can help-they are recommending injections with pain medicine specialist  .  Gallstones   . GERD (gastroesophageal reflux disease)   . Heart murmur birth  . Migraine   . Stroke ([place]) 07/12/2020   a. L MCA stroke in 06/2020.    Social History:  Social History   Socioeconomic History  . Marital status: Married    Spouse name: Not on file  . Number of children: 2  . Years of education: Not on file  . Highest education level: Not on file  Occupational History  . Occupation: resp therapist    Employer: [employer]  Tobacco Use  . Smoking status: Never Smoker  .  Smokeless tobacco: Never Used  Vaping Use  . Vaping Use: Never used  Substance and Sexual Activity  . Alcohol use: Not Currently  . Drug use: No  . Sexual activity: Yes    Birth control/protection: None  Other Topics Concern  . Not on file  Social History Narrative   Married, respiratory therapist [facility]   2 children   Occasional alcohol, never smoker no drug use   Right handed   Drinks 1-2 cups coffee daily   Social Determinants of Health   Financial Resource Strain:   . Difficulty of Paying Living Expenses: Not on file  Food Insecurity:   . Worried About Charity fundraiser in the Last Year: Not on file  . Ran Out of Food in the Last Year: Not on file  Transportation Needs:   . Lack of Transportation (Medical): Not on file  . Lack of Transportation (Non-Medical): Not on file  Physical Activity:   . Days of Exercise per Week: Not on file  . Minutes of Exercise per Session: Not on file  Stress:   . Feeling of Stress : Not on file  Social Connections:   . Frequency of Communication with Friends and Family: Not on file  . Frequency of Social Gatherings with Friends and Family: Not on file  . Attends Religious Services: Not on file  . Active Member of Clubs or Organizations: Not on file  . Attends Archivist Meetings: Not on file  . Marital Status: Not on file  Intimate Partner Violence:   . Fear of Current or Ex-Partner: Not on file  . Emotionally Abused: Not on file  . Physically Abused: Not on file  . Sexually Abused: Not on file    Medications:   Current Outpatient Medications on File Prior to Visit  Medication Sig Dispense Refill  . acetaminophen (TYLENOL) 500 MG tablet Take 1,000 mg by mouth daily as needed for headache.     . ALPRAZolam (XANAX) 0.5 MG tablet TAKE 1 TABLET(0.5 MG) BY MOUTH TWICE DAILY AS NEEDED FOR ANXIETY 30 tablet 1  . aspirin EC 81 MG EC tablet Take 1 tablet (81 mg total) by mouth daily. Swallow whole. 30 tablet 11  .  BIOTIN PO Take 1 tablet by mouth daily.     . clopidogrel (PLAVIX) 75 MG tablet Take 75 mg by mouth daily.    Marland Kitchen EPINEPHrine 0.3 mg/0.3 mL IJ SOAJ injection Inject 0.3 mLs (0.3 mg total) into the muscle once as needed for up to 1 dose for anaphylaxis. 2 each 1  . loratadine (CLARITIN) 10 MG tablet Take 10 mg by mouth daily.    . metoprolol succinate (TOPROL-XL) 25 MG 24 hr tablet Take 12.5 mg by mouth in the morning and at bedtime.     . Multiple Vitamins-Minerals (EMERGEN-C VITAMIN C) PACK Take 1 packet by mouth daily.    Marland Kitchen  pantoprazole (PROTONIX) 40 MG tablet TAKE 1 TABLET (40 MG TOTAL) BY MOUTH DAILY. 90 tablet 0  . Polyvinyl Alcohol-Povidone (REFRESH OP) Place 1 drop into both eyes daily as needed (dry eyes).    Marland Kitchen amoxicillin (AMOXIL) 500 MG capsule Take 4 capsules (2,000 mg total) by mouth once as needed for up to 1 dose (take one hour prior to procedures such as dental cleaning). 4 capsule 1   No current facility-administered medications on file prior to visit.    Allergies:   Allergies  Allergen Reactions  . Peanut-Containing Drug Products Anaphylaxis    THROAT GETS TIGHT, peanut butter    Physical Exam General: well developed, well nourished young pleasant Caucasian lady, seated, in no evident distress Head: head normocephalic and atraumatic.   Neck: supple with no carotid or supraclavicular bruits Cardiovascular: regular rate and rhythm, no murmurs Musculoskeletal: no deformity Skin:  no rash/petichiae Vascular:  Normal pulses all extremities  Neurologic Exam Mental Status: Awake and fully alert. Oriented to place and time. Recent and remote memory intact. Attention span, concentration and fund of knowledge appropriate. Mood and affect appropriate.  Cranial Nerves: Fundoscopic exam reveals sharp disc margins. Pupils equal, briskly reactive to light. Extraocular movements full without nystagmus. Visual fields full to confrontation. Hearing intact. Facial sensation intact. Face,  tongue, palate moves normally and symmetrically.  Motor: Normal bulk and tone. Normal strength in all tested extremity muscles. Sensory.: intact to touch , pinprick , position and vibratory sensation.  Coordination: Rapid alternating movements normal in all extremities. Finger-to-nose and heel-to-shin performed accurately bilaterally. Gait and Station: Arises from chair without difficulty. Stance is normal. Gait demonstrates normal stride length and balance . Able to heel, toe and tandem walk without difficulty.  Reflexes: 1+ and symmetric. Toes downgoing.   NIHSS  0 Modified Rankin  0   ASSESSMENT: 34 year old Caucasian lady with left frontal small embolic infarcts of cryptogenic etiology in July 2021 while presenting with episodes of classical migraines.  Negative work-up for vasculitis, hypercoagulable states.  Finding of atrial septal defect ostium secundum type which has been since endovascularly closed.  No significant vascular risk factors     PLAN: I had a long d/w patient about her recent cryptogenic strokes, classic migraines and ASD closure, risk for recurrent stroke/TIAs, personally independently reviewed imaging studies and stroke evaluation results and answered questions.Continue aspirin 81 mg daily and clopidogrel 75 mg daily  for secondary stroke prevention  For 6 months post ASD closure and then aspirin alone and maintain strict control of hypertension with blood pressure goal below 130/90, diabetes with hemoglobin A1c goal below 6.5% and lipids with LDL cholesterol goal below 70 mg/dL. I also advised the patient to eat a healthy diet with plenty of whole grains, cereals, fruits and vegetables, exercise regularly and maintain ideal body weight.  Her migraines are not frequent enough at the present time to justify prophylaxis with Cardizem and I have advised her to avoid migraine triggers.  Greater than 50% time during this 45-minute consultation visit was spent in counseling and  coordination of care about her cryptogenic strokes and classical migraines and possible paradoxical embolism and answering questions followup in the future with me in 6 months or call earlier if needed. Antony Contras, MD Note: This document was prepared with digital dictation and possible smart phrase technology. Any transcriptional errors that result from this process are unintentional.

## 2020-10-20 ENCOUNTER — Other Ambulatory Visit: Payer: Self-pay

## 2020-10-20 ENCOUNTER — Ambulatory Visit (INDEPENDENT_AMBULATORY_CARE_PROVIDER_SITE_OTHER): Payer: No Typology Code available for payment source | Admitting: Family Medicine

## 2020-10-20 ENCOUNTER — Encounter: Payer: Self-pay | Admitting: Family Medicine

## 2020-10-20 VITALS — BP 122/72 | HR 92 | Temp 97.4°F | Wt 163.4 lb

## 2020-10-20 DIAGNOSIS — H65191 Other acute nonsuppurative otitis media, right ear: Secondary | ICD-10-CM

## 2020-10-20 DIAGNOSIS — H6690 Otitis media, unspecified, unspecified ear: Secondary | ICD-10-CM | POA: Insufficient documentation

## 2020-10-20 MED ORDER — AMOXICILLIN-POT CLAVULANATE 875-125 MG PO TABS: 1.0000 | ORAL_TABLET | Freq: Two times a day (BID) | ORAL | 0 refills | Status: DC

## 2020-10-20 NOTE — Progress Notes (Signed)
Patient ID: Marissa Burnett, female    DOB: 1986-07-16, 34 y.o.   MRN: [id number]   Chief Complaint  Patient presents with  . fluid in ear    Patient reports sensation of fluid in both ears and what feels like a lump in her throat that comes and goes x 1 week.    Subjective:  CC: "lump in throat and water in ears  Marissa Burnett presents today with a feeling of a lump in her throat.  This started on Saturday night.  She felt as if something was stuck in her throat, and sore throat on and off.  She also feels like she has fluid in her ears.  She does have seasonal allergies but this feels different than her allergies.  She has had a trying year with her health, and she doesn't feel that she should ignore any symptoms based on that.  Pertinent negatives include no fever no chills no chest pain no shortness of breath no cough no ear pain.  Associated symptoms include slightly sore throat but mostly feels a lump in her throat.  She does have a history of ear infections.    Medical History Marissa Burnett has a past medical history of Abnormal Papanicolaou smear of cervix (10/15/2016), Allergy, Anxiety, ASD (atrial septal defect) (07/28/2020), Chronic lumbar pain (09/27/2018), Gallstones, GERD (gastroesophageal reflux disease), Heart murmur (birth), Migraine, and Stroke ([place]) (07/12/2020).   Outpatient Encounter Medications as of 10/20/2020  Medication Sig  . acetaminophen (TYLENOL) 500 MG tablet Take 1,000 mg by mouth daily as needed for headache.   . ALPRAZolam (XANAX) 0.5 MG tablet TAKE 1 TABLET(0.5 MG) BY MOUTH TWICE DAILY AS NEEDED FOR ANXIETY  . amoxicillin-clavulanate (AUGMENTIN) 875-125 MG tablet Take 1 tablet by mouth 2 (two) times daily.  Marland Kitchen aspirin EC 81 MG EC tablet Take 1 tablet (81 mg total) by mouth daily. Swallow whole.  Marland Kitchen BIOTIN PO Take 1 tablet by mouth daily.   . clopidogrel (PLAVIX) 75 MG tablet Take 75 mg by mouth daily.  Marland Kitchen EPINEPHrine 0.3 mg/0.3 mL IJ SOAJ injection Inject 0.3 mLs (0.3 mg  total) into the muscle once as needed for up to 1 dose for anaphylaxis.  Marland Kitchen loratadine (CLARITIN) 10 MG tablet Take 10 mg by mouth daily.  . metoprolol succinate (TOPROL-XL) 25 MG 24 hr tablet Take 12.5 mg by mouth in the morning and at bedtime.   . Multiple Vitamins-Minerals (EMERGEN-C VITAMIN C) PACK Take 1 packet by mouth daily.  . pantoprazole (PROTONIX) 40 MG tablet TAKE 1 TABLET (40 MG TOTAL) BY MOUTH DAILY.  . Polyvinyl Alcohol-Povidone (REFRESH OP) Place 1 drop into both eyes daily as needed (dry eyes).   No facility-administered encounter medications on file as of 10/20/2020.     Review of Systems  Constitutional: Negative for chills and fever.  HENT:       Feels like "lump" in throat  Respiratory: Negative for cough, choking and shortness of breath.   Cardiovascular: Negative for chest pain.  Hematological: Negative for adenopathy.     Vitals BP 122/72   Pulse 92   Temp (!) 97.4 F (36.3 C)   Wt 163 lb 6.4 oz (74.1 kg)   LMP 09/24/2020   SpO2 98%   BMI 28.05 kg/m   Objective:   Physical Exam Vitals and nursing note reviewed.  Constitutional:      General: She is not in acute distress.    Appearance: Normal appearance. She is not ill-appearing.  HENT:  Right Ear: Tympanic membrane is erythematous and bulging.     Left Ear: Tympanic membrane normal.     Nose: Nose normal.     Mouth/Throat:     Mouth: Mucous membranes are moist.     Pharynx: Oropharynx is clear. Posterior oropharyngeal erythema present. No oropharyngeal exudate.  Cardiovascular:     Rate and Rhythm: Normal rate and regular rhythm.     Heart sounds: Normal heart sounds.  Pulmonary:     Effort: Pulmonary effort is normal.     Breath sounds: Normal breath sounds.  Abdominal:     Tenderness: There is no abdominal tenderness.  Skin:    General: Skin is warm and dry.  Neurological:     Mental Status: She is alert and oriented to person, place, and time.  Psychiatric:        Mood and  Affect: Mood normal.        Behavior: Behavior normal.        Thought Content: Thought content normal.        Judgment: Judgment normal.      Assessment and Plan   1. Other non-recurrent acute nonsuppurative otitis media of right ear - amoxicillin-clavulanate (AUGMENTIN) 875-125 MG tablet; Take 1 tablet by mouth 2 (two) times daily.  Dispense: 20 tablet; Refill: 0   Right otitis media, will treat with Augmentin.  Of note Marissa Burnett is a non-smoker, and has very low risk factors for throat cancer, but with a feeling of having a lump in her throat, we will not ignore that symptom.  We are hopeful that the throat symptoms are related to the ear infection.  With the year that she has had with her health, we will have a very low threshold for ENT referral.  The plan is to complete this course of antibiotics, follow-up with me in approximately 2 weeks, and possible referral at that time.  She does have all of her wisdom teeth, I did check in her mouth there was no inflammation or pain noted upon palpation.   Agrees with plan of care discussed today. Understands warning signs to seek further care: Chest pain, shortness of breath, any significant changes in health status. Understands to follow-up in 2 weeks to ensure complete resolution of symptoms.  Will refer to ENT if needed.

## 2020-10-20 NOTE — Patient Instructions (Addendum)
Use Miralax for 7 days in row to help with constipation.    Otitis Media, Adult  Otitis media means that the middle ear is red and swollen (inflamed) and full of fluid. The condition usually goes away on its own. Follow these instructions at home:  Take over-the-counter and prescription medicines only as told by your doctor.  If you were prescribed an antibiotic medicine, take it as told by your doctor. Do not stop taking the antibiotic even if you start to feel better.  Keep all follow-up visits as told by your doctor. This is important. Contact a doctor if:  You have bleeding from your nose.  There is a lump on your neck.  You are not getting better in 5 days.  You feel worse instead of better. Get help right away if:  You have pain that is not helped with medicine.  You have swelling, redness, or pain around your ear.  You get a stiff neck.  You cannot move part of your face (paralyzed).  You notice that the bone behind your ear hurts when you touch it.  You get a very bad headache. Summary  Otitis media means that the middle ear is red, swollen, and full of fluid.  This condition usually goes away on its own. In some cases, treatment may be needed.  If you were prescribed an antibiotic medicine, take it as told by your doctor. This information is not intended to replace advice given to you by your health care provider. Make sure you discuss any questions you have with your health care provider. Document Revised: 11/28/2017 Document Reviewed: 01/06/2017 Elsevier Patient Education  2020 [employer].

## 2020-10-31 ENCOUNTER — Telehealth: Payer: No Typology Code available for payment source | Admitting: Physician Assistant

## 2020-10-31 NOTE — Progress Notes (Signed)
Virtual Visit via Video Note   This visit type was conducted due to national recommendations for restrictions regarding the COVID-19 Pandemic (e.g. social distancing) in an effort to limit this patient's exposure and mitigate transmission in our community.  Due to her co-morbid illnesses, this patient is at least at moderate risk for complications without adequate follow up.  This format is felt to be most appropriate for this patient at this time.  All issues noted in this document were discussed and addressed.  A limited physical exam was performed with this format.  Please refer to the patient's chart for her consent to telehealth for [facility].   Date:  11/01/2020   ID:  Marissa Burnett, DOB [birth year], MRN [id number] The patient was identified using 2 identifiers.  Patient Location: Other:  Work Engineer, structural: Home Office  PCP:  [name], MD  Cardiologist:  Sherren Mocha, MD   Electrophysiologist:  None   Evaluation Performed:  Follow-Up Visit  Chief Complaint:  Hospitalization Follow-up (Palpitations)    Patient Profile: Marissa Burnett is a 34 y.o. female with:  Hx of Migraine HAs w/ Aura  Hx of L MCA territory CVA (2-3 punctate ischemic infarcts)  Ostium Secundum ASD   S/p closure 08/30/2020   Nickel allergy   Incomplete RBBB  Premature atrial contractions, symptomatic  Briefly admitted 08/2020 after ASD closure  Seen by EP 09/2020 >> beta-blocker Rx   Prior CV Studies: [facility] Monitor 09/2020 1. The basic rhythm is normal sinus with an average HR of 88 bpm 2. No atrial fibrillation or flutter 3. No high-grade heart block or pathologic pauses 4. There are rare PVC's and freqent supraventricular beats without sustained arrhythmias 5. There is one run of 4 consecutive ventricular beats 6. Isolated supraventricular beats are frequent with an overall burden of 5.7%  Echocardiogram 09/26/20 EF 60-65, no RWMA, normal RVSF   History of Present  Illness:   Ms. [name] was admitted 9/27-9/28 with worsening palpitations.  These were felt to be symptomatic PACs.  After discharge, she had worsening symptoms and was ultimately seen by EP (Dr. Quentin Ore).  Again it was felt that she had symptomatic PACs based upon monitor results.  This was felt to be related to her recent ASD closure.  Echocardiogram in the hospital demonstrated that her occluder device was well-seated and there was no evidence of thrombus.  TSH was normal.  She had no evidence of atrial fibrillation.  There was some concern for PVCs.  However, this was felt to be from aberrancy.  She had been placed on Diltiazem in addition to metoprolol.  Dr. Quentin Ore recommended discontinuation of the Diltiazem with continued therapy with beta-blocker.  She is seen for follow-up.  Over the past month, she has had no recurrent palpitations.  She reduced her metoprolol to 12.5 mg once a day.  She has had some discomfort in her right chest recently.  This seems to be evident with deep inspiration and somewhat with palpation.  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  However, she is not certain that it is related to that.  She is a little more short of breath.  She has had some upper respiratory symptoms recently and her primary care physician placed her on antibiotics.  She has a follow-up appointment tomorrow.  Her oxygen saturations at work have been normal.  She did travel recently by car.  She has not had any leg swelling.  Past Medical History:  Diagnosis Date   Abnormal Papanicolaou smear of cervix 10/15/2016   LSIL+HPV   Allergy    Anxiety    ASD (atrial septal defect) 07/28/2020   Chronic lumbar pain 09/27/2018   Patient has seen Dr. Arnoldo Morale neurosurgery September 2019-they do not feel there is anything surgical that can help-they are recommending injections with pain medicine specialist   Gallstones    GERD (gastroesophageal reflux disease)     Heart murmur birth   Migraine    Stroke ([place]) 07/12/2020   a. L MCA stroke in 06/2020.   Past Surgical History:  Procedure Laterality Date   ABDOMINOPLASTY     ATRIAL SEPTAL DEFECT(ASD) CLOSURE N/A 08/30/2020   Procedure: ATRIAL SEPTAL DEFECT (ASD) CLOSURE;  Surgeon: Sherren Mocha, MD;  Location: [facility] [facility];  Service: Cardiovascular;  Laterality: N/A;   BREAST ENHANCEMENT SURGERY     CERVICAL CONIZATION W/BX N/A 12/13/2016   Procedure: CONIZATION CERVIX WITH BIOPSY--cold knife conization;  Surgeon: Jonnie Kind, MD;  Location: [facility];  Service: Gynecology;  Laterality: N/A;   CHOLECYSTECTOMY  02/2002   ENDOSCOPIC RETROGRADE CHOLANGIOPANCREATOGRAPHY (ERCP) WITH PROPOFOL     OTHER SURGICAL HISTORY     stent in kidney during pregnancy,2002; removed in 2003   TEE WITHOUT CARDIOVERSION N/A 08/04/2020   Procedure: TRANSESOPHAGEAL ECHOCARDIOGRAM (TEE);  Surgeon: Larey Dresser, MD;  Location: [facility] END[facility];  Service: Cardiovascular;  Laterality: N/A;     Current Meds  Medication Sig   acetaminophen (TYLENOL) 500 MG tablet Take 1,000 mg by mouth daily as needed for headache.    ALPRAZolam (XANAX) 0.5 MG tablet TAKE 1 TABLET(0.5 MG) BY MOUTH TWICE DAILY AS NEEDED FOR ANXIETY   amoxicillin-clavulanate (AUGMENTIN) 875-125 MG tablet Take 1 tablet by mouth 2 (two) times daily.   aspirin EC 81 MG EC tablet Take 1 tablet (81 mg total) by mouth daily. Swallow whole.   BIOTIN PO Take 1 tablet by mouth daily.    clopidogrel (PLAVIX) 75 MG tablet Take 75 mg by mouth daily.   EPINEPHrine 0.3 mg/0.3 mL IJ SOAJ injection Inject 0.3 mLs (0.3 mg total) into the muscle once as needed for up to 1 dose for anaphylaxis.   loratadine (CLARITIN) 10 MG tablet Take 10 mg by mouth daily.   metoprolol succinate (TOPROL-XL) 25 MG 24 hr tablet Take 12.5 mg by mouth in the morning and at bedtime.    Multiple Vitamins-Minerals (EMERGEN-C VITAMIN C) PACK Take 1 packet by mouth daily.    pantoprazole (PROTONIX) 40 MG tablet TAKE 1 TABLET (40 MG TOTAL) BY MOUTH DAILY.   Polyvinyl Alcohol-Povidone (REFRESH OP) Place 1 drop into both eyes daily as needed (dry eyes).     Allergies:   Peanut-containing drug products   Social History   Tobacco Use   Smoking status: Never Smoker   Smokeless tobacco: Never Used  Vaping Use   Vaping Use: Never used  Substance Use Topics   Alcohol use: Not Currently   Drug use: No     Family Hx: The patient's family history includes ADD / ADHD in her daughter; Anxiety disorder in her daughter; COPD in her maternal grandfather; Colon polyps in her paternal grandmother; Diabetes in her maternal grandmother and another family member; Heart disease in her paternal grandmother; Irritable bowel syndrome in her paternal grandmother; Lung cancer in her paternal grandfather. There is no history of Allergic rhinitis, Angioedema, Asthma, Atopy, Eczema, Immunodeficiency, or Urticaria.  ROS:   Please see the history of  present illness.     Labs/Other Tests and Data Reviewed:    EKG:  No ECG reviewed.  Recent Labs: 07/27/2020: ALT 13 09/25/2020: Hemoglobin 13.7; Platelets 230; TSH 0.576 09/26/2020: BUN 10; Creatinine, Ser 0.60; Magnesium 2.2; Potassium 3.8; Sodium 139   Recent Lipid Panel Lab Results  Component Value Date/Time   CHOL 135 07/28/2020 03:51 AM   CHOL 146 10/14/2018 10:26 AM   TRIG 46 07/28/2020 03:51 AM   HDL 57 07/28/2020 03:51 AM   HDL 64 10/14/2018 10:26 AM   CHOLHDL 2.4 07/28/2020 03:51 AM   LDLCALC 69 07/28/2020 03:51 AM   LDLCALC 69 10/14/2018 10:26 AM    Wt Readings from Last 3 Encounters:  11/01/20 160 lb (72.6 kg)  10/20/20 163 lb 6.4 oz (74.1 kg)  10/19/20 164 lb 3.2 oz (74.5 kg)     Risk Assessment/Calculations:      Objective:    Vital Signs:  BP 121/78    Ht 5\' 4"  (1.626 m)    Wt 160 lb (72.6 kg)    BMI 27.46 kg/m    VITAL SIGNS:  reviewed GEN:  no acute distress RESPIRATORY:  Normal respiratory  effort NEURO:  Alert and oriented, no obvious focal deficit PSYCH:  normal affect  ASSESSMENT & PLAN:    1. ASD (atrial septal defect) Status post closure in September 2021.  Echocardiogram during recent hospitalization demonstrated well-seated occluder device and no evidence of thrombus.  She remains on aspirin and clopidogrel.  She has a follow-up echo scheduled in March 2022.  I will touch base with Dr. Burt Knack to determine when her usual follow-up should be in the office.  2. Premature atrial contractions These were likely related to recent implantation of ASD closure device.  She has not really had significant palpitations over the past few weeks.  She wonders if she should remain on the metoprolol.  I have asked her to continue metoprolol for total of 3 months and then she can taper herself off.  If she remains symptom free without daily beta-blocker, she can certainly take it as needed.  3. Other chest pain She notes atypical chest discomfort in her right chest.  It sounds like this is more musculoskeletal than anything.  She does have an appointment with her primary care provider tomorrow.  No further cardiac testing is needed at this time.    Time:   Today, I have spent 10 minutes with the patient with telehealth technology discussing the above problems.     Medication Adjustments/Labs and Tests Ordered: Current medicines are reviewed at length with the patient today.  Concerns regarding medicines are outlined above.   Tests Ordered: No orders of the defined types were placed in this encounter.   Medication Changes: No orders of the defined types were placed in this encounter.   Follow Up:  In Person As planned with Dr. Burt Knack  Signed, Richardson Dopp, PA-C  11/01/2020 4:56 PM    Manuel Garcia

## 2020-11-01 ENCOUNTER — Encounter: Payer: Self-pay | Admitting: Physician Assistant

## 2020-11-01 ENCOUNTER — Other Ambulatory Visit: Payer: Self-pay | Admitting: *Deleted

## 2020-11-01 ENCOUNTER — Telehealth (INDEPENDENT_AMBULATORY_CARE_PROVIDER_SITE_OTHER): Payer: No Typology Code available for payment source | Admitting: Physician Assistant

## 2020-11-01 VITALS — BP 121/78 | Ht 64.0 in | Wt 160.0 lb

## 2020-11-01 DIAGNOSIS — R0789 Other chest pain: Secondary | ICD-10-CM | POA: Diagnosis not present

## 2020-11-01 DIAGNOSIS — I491 Atrial premature depolarization: Secondary | ICD-10-CM | POA: Diagnosis not present

## 2020-11-01 DIAGNOSIS — Q211 Atrial septal defect: Secondary | ICD-10-CM

## 2020-11-01 NOTE — Patient Outreach (Signed)
Marissa Burnett) Care Management  11/01/2020  Marissa Burnett October 19, 1986 [id number]   Transition of care call Referral received: 10/02/20 Initial outreach attempt: 10/03/20 Insurance: [employer]  Outreach attempt #4 Subjective : Outreach call to [name], begin to explain reason for the call, member states that she appreciates call but she declined to proceed with assessment .Marissa Burnett states that she is doing well.   Objective: [facility] Premature atrial contractions,Comorbidities include: S/P Atrial Septal Defect closure , Left MCA stroke 06/2020, Migraines. Shewas discharged to home on 9/28/21without the need for home health servicesor DME.  Plan Will plan case closure.    Joylene Draft, RN, BSN  [place] Management Coordinator  [phone]- Mobile [phone]- Toll Free Main Office

## 2020-11-01 NOTE — Patient Instructions (Signed)
Medication Instructions:  Your physician has recommended you make the following change in your medication:   1) Continue Metoprolol 12.5 mg for 3 months and taper off medication. You may continue to take Metoprolol as needed for palpitations  *If you need a refill on your cardiac medications before your next appointment, please call your pharmacy*  Lab Work: None ordered today  If you have labs (blood work) drawn today and your tests are completely normal, you will receive your results only by: Marland Kitchen MyChart Message (if you have MyChart) OR . A paper copy in the mail If you have any lab test that is abnormal or we need to change your treatment, we will call you to review the results.  Testing/Procedures: Keep echocardiogram appointment in March 2022  Follow-Up: Nicki Reaper will reach out to Dr. Burt Knack about your follow up

## 2020-11-02 ENCOUNTER — Encounter: Payer: Self-pay | Admitting: Family Medicine

## 2020-11-02 ENCOUNTER — Other Ambulatory Visit: Payer: Self-pay | Admitting: Family Medicine

## 2020-11-02 ENCOUNTER — Ambulatory Visit (INDEPENDENT_AMBULATORY_CARE_PROVIDER_SITE_OTHER): Payer: No Typology Code available for payment source | Admitting: Family Medicine

## 2020-11-02 ENCOUNTER — Other Ambulatory Visit: Payer: Self-pay

## 2020-11-02 VITALS — BP 126/74 | HR 94 | Temp 97.0°F | Ht 64.0 in | Wt 167.0 lb

## 2020-11-02 DIAGNOSIS — J020 Streptococcal pharyngitis: Secondary | ICD-10-CM

## 2020-11-02 LAB — POCT RAPID STREP A (OFFICE): Rapid Strep A Screen: POSITIVE — AB

## 2020-11-02 MED ORDER — PENICILLIN V POTASSIUM 500 MG PO TABS: 500.0000 mg | ORAL_TABLET | Freq: Three times a day (TID) | ORAL | 0 refills | Status: DC

## 2020-11-02 NOTE — Progress Notes (Signed)
Pt here to recheck ears and throat. Pt states that she has noticed when she wears a mask (N95 specifically) she notices the ear pain. Throat did get better but still having lump sensations at time. Pt wondering if acid reflux could be contributing to throat issue.     Patient ID: Marissa Burnett, female    DOB: January 19, 1986, 34 y.o.   MRN: [id number]   Chief Complaint  Patient presents with   Ear Pain   Subjective:    Presents today to follow-up from her right ear otitis media and "lump in the throat "feeling.  She went on a recent trip, and her diet was not good and she drank alcohol more regularly and she thought that it was possibly her acid reflux.  To note she is on Protonix daily.  She is negative for fever chills fatigue headache no ear pain.  She completed her antibiotic for the right otitis media on Monday morning.    Medical History Marissa Burnett has a past medical history of Abnormal Papanicolaou smear of cervix (10/15/2016), Allergy, Anxiety, ASD (atrial septal defect) (07/28/2020), Chronic lumbar pain (09/27/2018), Gallstones, GERD (gastroesophageal reflux disease), Heart murmur (birth), Migraine, and Stroke ([place]) (07/12/2020).   Outpatient Encounter Medications as of 11/02/2020  Medication Sig   acetaminophen (TYLENOL) 500 MG tablet Take 1,000 mg by mouth daily as needed for headache.    ALPRAZolam (XANAX) 0.5 MG tablet TAKE 1 TABLET(0.5 MG) BY MOUTH TWICE DAILY AS NEEDED FOR ANXIETY   aspirin EC 81 MG EC tablet Take 1 tablet (81 mg total) by mouth daily. Swallow whole.   BIOTIN PO Take 1 tablet by mouth daily.    clopidogrel (PLAVIX) 75 MG tablet Take 75 mg by mouth daily.   EPINEPHrine 0.3 mg/0.3 mL IJ SOAJ injection Inject 0.3 mLs (0.3 mg total) into the muscle once as needed for up to 1 dose for anaphylaxis.   loratadine (CLARITIN) 10 MG tablet Take 10 mg by mouth daily.   metoprolol succinate (TOPROL-XL) 25 MG 24 hr tablet Take 12.5 mg by mouth in the morning and at  bedtime.    Multiple Vitamins-Minerals (EMERGEN-C VITAMIN C) PACK Take 1 packet by mouth daily.   pantoprazole (PROTONIX) 40 MG tablet TAKE 1 TABLET (40 MG TOTAL) BY MOUTH DAILY.   Polyvinyl Alcohol-Povidone (REFRESH OP) Place 1 drop into both eyes daily as needed (dry eyes).   [DISCONTINUED] amoxicillin-clavulanate (AUGMENTIN) 875-125 MG tablet Take 1 tablet by mouth 2 (two) times daily.   No facility-administered encounter medications on file as of 11/02/2020.     Review of Systems  Constitutional: Negative for chills and fever.  HENT: Negative for sinus pressure, sinus pain and sore throat.   Respiratory: Negative for shortness of breath.   Cardiovascular: Negative for chest pain.  Gastrointestinal: Negative for abdominal pain.     Vitals BP 126/74    Pulse 94    Temp (!) 97 F (36.1 C)    Ht 5\' 4"  (1.626 m)    Wt 167 lb (75.8 kg)    SpO2 100%    BMI 28.67 kg/m   Objective:   Physical Exam Vitals and nursing note reviewed.  Constitutional:      Appearance: Normal appearance.  HENT:     Right Ear: Tympanic membrane normal.     Left Ear: Tympanic membrane normal.     Nose: Nose normal.     Right Sinus: No maxillary sinus tenderness or frontal sinus tenderness.     Left Sinus: No  maxillary sinus tenderness or frontal sinus tenderness.     Mouth/Throat:     Mouth: Mucous membranes are moist.     Pharynx: Posterior oropharyngeal erythema present. No oropharyngeal exudate.     Tonsils: No tonsillar exudate or tonsillar abscesses. 2+ on the right. 2+ on the left.  Cardiovascular:     Rate and Rhythm: Normal rate and regular rhythm.     Heart sounds: Normal heart sounds.  Pulmonary:     Effort: Pulmonary effort is normal.     Breath sounds: Normal breath sounds.  Skin:    General: Skin is warm and dry.  Neurological:     Mental Status: She is alert and oriented to person, place, and time.  Psychiatric:        Mood and Affect: Mood normal.        Behavior: Behavior  normal.        Thought Content: Thought content normal.        Judgment: Judgment normal.    Results for orders placed or performed during the hospital encounter of 09/25/20  Respiratory Panel by RT PCR (Flu A&B, Covid) - Nasopharyngeal Swab   Specimen: Nasopharyngeal Swab  Result Value Ref Range   SARS Coronavirus 2 by RT PCR NEGATIVE NEGATIVE   Influenza A by PCR NEGATIVE NEGATIVE   Influenza B by PCR NEGATIVE NEGATIVE  Basic metabolic panel  Result Value Ref Range   Sodium 138 135 - 145 mmol/L   Potassium 3.4 (L) 3.5 - 5.1 mmol/L   Chloride 103 98 - 111 mmol/L   CO2 21 (L) 22 - 32 mmol/L   Glucose, Bld 108 (H) 70 - 99 mg/dL   BUN 12 6 - 20 mg/dL   Creatinine, Ser 0.74 0.44 - 1.00 mg/dL   Calcium 9.6 8.9 - 10.3 mg/dL   GFR calc non Af Amer >60 >60 mL/min   GFR calc Af Amer >60 >60 mL/min   Anion gap 14 5 - 15  CBC  Result Value Ref Range   WBC 7.4 4.0 - 10.5 K/uL   RBC 4.54 3.87 - 5.11 MIL/uL   Hemoglobin 13.7 12.0 - 15.0 g/dL   HCT 42.0 36 - 46 %   MCV 92.5 80.0 - 100.0 fL   MCH 30.2 26.0 - 34.0 pg   MCHC 32.6 30.0 - 36.0 g/dL   RDW 11.9 11.5 - 15.5 %   Platelets 230 150 - 400 K/uL   nRBC 0.0 0.0 - 0.2 %  Magnesium  Result Value Ref Range   Magnesium 1.8 1.7 - 2.4 mg/dL  TSH  Result Value Ref Range   TSH 0.576 0.350 - 4.500 uIU/mL  T4, free  Result Value Ref Range   Free T4 1.06 0.61 - 1.12 ng/dL  Basic metabolic panel  Result Value Ref Range   Sodium 139 135 - 145 mmol/L   Potassium 3.8 3.5 - 5.1 mmol/L   Chloride 110 98 - 111 mmol/L   CO2 22 22 - 32 mmol/L   Glucose, Bld 95 70 - 99 mg/dL   BUN 10 6 - 20 mg/dL   Creatinine, Ser 0.60 0.44 - 1.00 mg/dL   Calcium 8.3 (L) 8.9 - 10.3 mg/dL   GFR calc non Af Amer >60 >60 mL/min   GFR calc Af Amer >60 >60 mL/min   Anion gap 7 5 - 15  Magnesium  Result Value Ref Range   Magnesium 2.2 1.7 - 2.4 mg/dL  VITAMIN D 25 Hydroxy (Vit-D Deficiency, Fractures)  Result Value Ref Range   Vit D, 25-Hydroxy 37.57 30 -  100 ng/mL  I-Stat beta hCG blood, ED  Result Value Ref Range   I-stat hCG, quantitative <5.0 <5 mIU/mL   Comment 3          ECHOCARDIOGRAM COMPLETE  Result Value Ref Range   Weight 2,641.6 oz   Height 64 in   BP 103/60 mmHg   Single Plane A2C EF 61.7 %   Single Plane A4C EF 66.0 %   Calc EF 63.8 %   S' Lateral 3.50 cm   Area-P 1/2 3.72 cm2  Troponin I (High Sensitivity)  Result Value Ref Range   Troponin I (High Sensitivity) <2 <18 ng/L  Troponin I (High Sensitivity)  Result Value Ref Range   Troponin I (High Sensitivity) <2 <18 ng/L   No results found for any visits on 11/02/20.   Assessment and Plan   1. Strep pharyngitis   Rapid strep positive.  Tonsils 2+ bilaterally.  Will treat with penicillin 500 mg 3 times daily.  To note she says that she had strep throat a lot when she was a child.  Her throat today is erythematous, no exudate.  Her lymph nodes were tender and slightly enlarged.  Information given today on an acid reflux diet, foods to avoid.  Agrees with plan of care discussed today. Understands warning signs to seek further care: Fever, chest pain, shortness of breath, any significant change in health status. Understands to follow-up on November 19 for a repeat rapid strep.  If positive, ENT referral will be placed.  If her symptoms are still present, ENT referral will be placed.    Chalmers Guest, NP 11/02/2020

## 2020-11-02 NOTE — Progress Notes (Signed)
amb  

## 2020-11-02 NOTE — Patient Instructions (Signed)

## 2020-11-08 NOTE — Telephone Encounter (Signed)
Nurses Please connect with patient There are options One option is changing antibiotics to Cefzil 500 mg twice daily for 10 days and use OTC decongestant spray to open up the eustachian tube over the course of the next 2 to 3 days  Second option is I can see [facility] late tomorrow afternoon for recheck if she desires  She should also connect with Dr. Benjamine Mola office to see if she can be placed on a cancellation list  Please see what works for [employer] thank you

## 2020-11-08 NOTE — Telephone Encounter (Signed)
Called pt and discussed with her. Pt states she wants appt tomorrow with dr Nicki Reaper. Transferred her up to be scheduled.

## 2020-11-09 ENCOUNTER — Encounter: Payer: Self-pay | Admitting: Family Medicine

## 2020-11-09 ENCOUNTER — Ambulatory Visit (INDEPENDENT_AMBULATORY_CARE_PROVIDER_SITE_OTHER): Payer: No Typology Code available for payment source | Admitting: Family Medicine

## 2020-11-09 ENCOUNTER — Other Ambulatory Visit: Payer: Self-pay

## 2020-11-09 VITALS — BP 122/80 | HR 100 | Temp 95.2°F | Wt 166.8 lb

## 2020-11-09 DIAGNOSIS — H6993 Unspecified Eustachian tube disorder, bilateral: Secondary | ICD-10-CM | POA: Diagnosis not present

## 2020-11-09 DIAGNOSIS — T148XXA Other injury of unspecified body region, initial encounter: Secondary | ICD-10-CM

## 2020-11-09 NOTE — Progress Notes (Signed)
   Subjective:    Patient ID: Marissa Burnett, female    DOB: 12-11-1986, 34 y.o.   MRN: [id number] Very nice patient HPI Pt here for recheck of throat and ears. Tested positive for strep last week. Weird lump sensation in throat. Also having the feeling that ears need to pop. The patient was treated with antibiotics Augmentin for 10 days then she had positive strep test but she states she was not having any tonsillar pain or discomfort It is quite possible what she was experiencing was more about a carrier state  Pt also noticed rash on stomach and today she noticed rash on left thigh. Rash on stomach has went away.  There is a rash on her left upper thigh which has not gone away   Review of Systems Please see above    Objective:   Physical Exam  Lungs are clear heart is regular neck no masses tonsillar area she has a generous sized tonsils for her age but no sign of infection  The rash on the left upper thigh was observed with a nurse present consistent with petechial/blood within the dermis change    Assessment & Plan:  Given that she is not having petechial change anywhere else I think that this is localized related to possible contusion or pressure on this area with exercise certainly if she started getting it multiple areas to check a CBC she understands this  I believe that she had carrier state of the strep and not a true infection she is already taking 10 days of Augmentin I do not recommend her continue with her current antibiotic  I would recommend follow-up if any ongoing troubles she has an appointment with ENT coming up It is quite possible she has eustachian tube dysfunction her eardrums look good

## 2020-11-16 ENCOUNTER — Other Ambulatory Visit: Payer: Self-pay | Admitting: Family Medicine

## 2020-11-16 ENCOUNTER — Telehealth: Payer: Self-pay | Admitting: Internal Medicine

## 2020-11-16 NOTE — Telephone Encounter (Signed)
Spoke with pt and made her aware ok to be around an MRI machine.  Pt appreciative for call.

## 2020-11-16 NOTE — Telephone Encounter (Signed)
The amplatzer device is MRI compatible and she should be okay to get MRI.

## 2020-11-16 NOTE — Telephone Encounter (Signed)
I don't think there are any temporary precautions post Patent Foramen Ovale closure for exposure to magnets. But, I will forward to Dr. Burt Knack and Nell Range, PA-C to be certain. Richardson Dopp, PA-C    11/16/2020 12:33 PM

## 2020-11-16 NOTE — Telephone Encounter (Signed)
New Message:    Pt wants to know if it is alrigght for to be around a MRI machine with her Amplatver Device? She needs to take a pt to have a MRI this afternoon and needs t know please.

## 2020-11-17 ENCOUNTER — Ambulatory Visit: Payer: No Typology Code available for payment source | Admitting: Family Medicine

## 2020-11-25 ENCOUNTER — Encounter: Payer: Self-pay | Admitting: Emergency Medicine

## 2020-11-25 ENCOUNTER — Ambulatory Visit
Admission: EM | Admit: 2020-11-25 | Discharge: 2020-11-25 | Disposition: A | Discharge status: Home / Self Care | Payer: No Typology Code available for payment source | Attending: Family Medicine | Admitting: Family Medicine

## 2020-11-25 ENCOUNTER — Other Ambulatory Visit: Payer: Self-pay

## 2020-11-25 DIAGNOSIS — S0502XA Injury of conjunctiva and corneal abrasion without foreign body, left eye, initial encounter: Secondary | ICD-10-CM

## 2020-11-25 MED ORDER — ERYTHROMYCIN 5 MG/GM OP OINT: TOPICAL_OINTMENT | OPHTHALMIC | 0 refills | Status: AC

## 2020-11-25 NOTE — ED Triage Notes (Signed)
Pt reports eye irritation since last night after taking out her contacts.  Pt states she has irrigated eyes and used drops with no relief.

## 2020-11-25 NOTE — ED Provider Notes (Signed)
[facility]    CSN: [id number] Arrival date & time: 11/25/20  1550      History   Chief Complaint Chief Complaint  Patient presents with  . Eye Problem    HPI Marissa Burnett is a 34 y.o. female.   HPI  Patient presents today with left eye redness. She is a contact lens wearer, however endorses that she changes contacts as prescribed and doesn't sleep in contacts.  She fears that she may have gotten an eyelash caught under her eyelid and eye has become increasingly irritated.  She denies any visual impairments or deficits.  Past Medical History:  Diagnosis Date  . Abnormal Papanicolaou smear of cervix 10/15/2016   LSIL+HPV  . Allergy   . Anxiety   . ASD (atrial septal defect) 07/28/2020  . Chronic lumbar pain 09/27/2018   Patient has seen Dr. Arnoldo Morale neurosurgery September 2019-they do not feel there is anything surgical that can help-they are recommending injections with pain medicine specialist  . Gallstones   . GERD (gastroesophageal reflux disease)   . Heart murmur birth  . Migraine   . Stroke ([place]) 07/12/2020   a. L MCA stroke in 06/2020.    Patient Active Problem List   Diagnosis Date Noted  . Strep pharyngitis 11/02/2020  . Otitis media 10/20/2020  . Palpitations 09/26/2020  . Dizziness 09/26/2020  . Premature atrial contractions 09/25/2020  . Hypokalemia 09/25/2020  . Atrial septal defect 08/30/2020  . Contact dermatitis due to metals 08/23/2020  . CVA (cerebral vascular accident) ([place]) 07/27/2020  . Abnormal MRI 07/27/2020  . GERD (gastroesophageal reflux disease) 07/27/2020  . Migraine 07/27/2020  . Insomnia 04/30/2020  . Gastroesophageal reflux disease with esophagitis 07/01/2019  . Chronic lumbar pain 09/27/2018  . Somatic dysfunction of left sacroiliac joint 02/09/2018  . CIN III (cervical intraepithelial neoplasia grade III) with severe dysplasia 11/18/2016  . Abnormal Papanicolaou smear of cervix 10/15/2016  . Urticaria 03/16/2016   . Anxiety 06/30/2015  . ADD (attention deficit disorder) 07/20/2014    Past Surgical History:  Procedure Laterality Date  . ABDOMINOPLASTY    . ATRIAL SEPTAL DEFECT(ASD) CLOSURE N/A 08/30/2020   Procedure: ATRIAL SEPTAL DEFECT (ASD) CLOSURE;  Surgeon: Sherren Mocha, MD;  Location: [facility];  Service: Cardiovascular;  Laterality: N/A;  . BREAST ENHANCEMENT SURGERY    . CERVICAL CONIZATION W/BX N/A 12/13/2016   Procedure: CONIZATION CERVIX WITH BIOPSY--cold knife conization;  Surgeon: Jonnie Kind, MD;  Location: [facility];  Service: Gynecology;  Laterality: N/A;  . CHOLECYSTECTOMY  02/2002  . ENDOSCOPIC RETROGRADE CHOLANGIOPANCREATOGRAPHY (ERCP) WITH PROPOFOL    . OTHER SURGICAL HISTORY     stent in kidney during pregnancy,2002; removed in 2003  . TEE WITHOUT CARDIOVERSION N/A 08/04/2020   Procedure: TRANSESOPHAGEAL ECHOCARDIOGRAM (TEE);  Surgeon: Larey Dresser, MD;  Location: [facility];  Service: Cardiovascular;  Laterality: N/A;    OB History    Gravida  4   Para  2   Term  2   Preterm      AB  2   Living  2     SAB  2   TAB      Ectopic      Multiple      Live Births  2            Home Medications    Prior to Admission medications   Medication Sig Start Date End Date Taking? Authorizing Provider  acetaminophen (TYLENOL) 500 MG tablet Take  1,000 mg by mouth daily as needed for headache.     [provider]  ALPRAZolam (XANAX) 0.5 MG tablet TAKE 1 TABLET(0.5 MG) BY MOUTH TWICE DAILY AS NEEDED FOR ANXIETY 11/16/20   [name], MD  aspirin EC 81 MG EC tablet Take 1 tablet (81 mg total) by mouth daily. Swallow whole. 07/29/20   Little Ishikawa, MD  BIOTIN PO Take 1 tablet by mouth daily.     [provider]  clopidogrel (PLAVIX) 75 MG tablet Take 75 mg by mouth daily.    [provider]  EPINEPHrine 0.3 mg/0.3 mL IJ SOAJ injection Inject 0.3 mLs (0.3 mg total) into the muscle once as needed for up to 1 dose  for anaphylaxis. 08/21/20   Althea Charon, FNP  erythromycin ophthalmic ointment Place a 1/2 inch ribbon of ointment into the lower eyelid (left) 11/25/20 11/30/20  Scot Jun, FNP  loratadine (CLARITIN) 10 MG tablet Take 10 mg by mouth daily.    [provider]  metoprolol succinate (TOPROL-XL) 25 MG 24 hr tablet Take 12.5 mg by mouth in the morning and at bedtime.     [provider]  Multiple Vitamins-Minerals (EMERGEN-C VITAMIN C) PACK Take 1 packet by mouth daily.    [provider]  pantoprazole (PROTONIX) 40 MG tablet TAKE 1 TABLET (40 MG TOTAL) BY MOUTH DAILY. 09/11/20   [name], MD  penicillin v potassium (VEETID) 500 MG tablet Take 1 tablet (500 mg total) by mouth 3 (three) times daily. 11/02/20   Chalmers Guest, NP  Polyvinyl Alcohol-Povidone (REFRESH OP) Place 1 drop into both eyes daily as needed (dry eyes).    [provider]    Family History Family History  Problem Relation Age of Onset  . Diabetes Other   . Lung cancer Paternal Grandfather   . Diabetes Maternal Grandmother   . COPD Maternal Grandfather   . Heart disease Paternal Grandmother   . Colon polyps Paternal Grandmother   . Irritable bowel syndrome Paternal Grandmother   . ADD / ADHD Daughter   . Anxiety disorder Daughter   . Allergic rhinitis Neg Hx   . Angioedema Neg Hx   . Asthma Neg Hx   . Atopy Neg Hx   . Eczema Neg Hx   . Immunodeficiency Neg Hx   . Urticaria Neg Hx     Social History Social History   Tobacco Use  . Smoking status: Never Smoker  . Smokeless tobacco: Never Used  Vaping Use  . Vaping Use: Never used  Substance Use Topics  . Alcohol use: Not Currently  . Drug use: No     Allergies   Peanut-containing drug products   Review of Systems Review of Systems Pertinent negatives listed in HPI Physical Exam Triage Vital Signs ED Triage Vitals  Enc Vitals Group     BP 11/25/20 1600 126/85     Pulse Rate 11/25/20 1600 90      Resp 11/25/20 1600 14     Temp 11/25/20 1600 98.2 F (36.8 C)     Temp Source 11/25/20 1600 Oral     SpO2 11/25/20 1600 99 %     Weight 11/25/20 1607 163 lb (73.9 kg)     Height 11/25/20 1607 5\' 4"  (1.626 m)     Head Circumference --      Peak Flow --      Pain Score 11/25/20 1607 0     Pain Loc --  Pain Edu? --      Excl. in [place]? --    No data found.  Updated Vital Signs BP 126/85 (BP Location: Right Arm)   Pulse 90   Temp 98.2 F (36.8 C) (Oral)   Resp 14   Ht 5\' 4"  (1.626 m)   Wt 163 lb (73.9 kg)   LMP 11/09/2020   SpO2 99%   BMI 27.98 kg/m   Visual Acuity Right Eye Distance:   Left Eye Distance:   Bilateral Distance:    Right Eye Near:   Left Eye Near:    Bilateral Near:     Physical Exam Constitutional:      Appearance: Normal appearance.  Eyes:     General: Lids are normal.     Pupils: Pupils are equal, round, and reactive to light.     Left eye: Corneal abrasion and fluorescein uptake present.   Cardiovascular:     Rate and Rhythm: Normal rate and regular rhythm.  Pulmonary:     Effort: Pulmonary effort is normal.     Breath sounds: Normal breath sounds and air entry.  Neurological:     Mental Status: She is alert.  Psychiatric:        Attention and Perception: Attention and perception normal.      UC Treatments / Results  Labs (all labs ordered are listed, but only abnormal results are displayed) Labs Reviewed - No data to display  EKG   Radiology No results found.  Procedures Procedures (including critical care time)  Medications Ordered in UC Medications - No data to display  Initial Impression / Assessment and Plan / UC Course  I have reviewed the triage vital signs and the nursing notes.  Pertinent labs & imaging results that were available during my care of the patient were reviewed by me and considered in my medical decision making (see chart for details).    Left cornea abrasion, no FB seen on fluorescein exam.   Erythromycin drops apply as directed.  Symptoms worsen follow-up with ophthalmology.  Patient verbalized understanding and agreement with plan.  Final Clinical Impressions(s) / UC Diagnoses   Final diagnoses:  Abrasion of left cornea, initial encounter   Discharge Instructions   None    ED Prescriptions    Medication Sig Dispense Auth. Provider   erythromycin ophthalmic ointment Place a 1/2 inch ribbon of ointment into the lower eyelid (left) 3.5 g Scot Jun, FNP     PDMP not reviewed this encounter.   Scot Jun, [place] 11/30/20 [phone]

## 2020-11-28 ENCOUNTER — Telehealth: Payer: Self-pay | Admitting: Neurology

## 2020-11-28 NOTE — Telephone Encounter (Signed)
I received a voicemail from patient regarding Botox. Patient states she is interested in Botox and would like to know how to start treatment. I looked over Dr. Clydene Fake last office note which states "patient's migraines are not frequent enough at the present time to justify prophylaxis with Cardizem and I have advised her to avoid migraine triggers."  Dr. Leonie Man, is Botox appropriate for this patient? Based your notes it appears that the patient does not meet the criteria required for Botox approval. Please advise.

## 2020-11-28 NOTE — Telephone Encounter (Signed)
I agree I have been unable to reach this patient by phone.  A letter is being sent to the last known home address. Her know. If she has further questions she can call me

## 2020-12-01 ENCOUNTER — Telehealth: Payer: Self-pay

## 2020-12-01 ENCOUNTER — Other Ambulatory Visit: Payer: Self-pay | Admitting: *Deleted

## 2020-12-01 ENCOUNTER — Ambulatory Visit: Payer: Self-pay

## 2020-12-01 DIAGNOSIS — K2100 Gastro-esophageal reflux disease with esophagitis, without bleeding: Secondary | ICD-10-CM

## 2020-12-01 MED FILL — FAMOTIDINE 20 MG TABS: 20 | 30 days supply | Qty: 30 | Fill #0

## 2020-12-01 NOTE — Telephone Encounter (Signed)
Patient went to see Dr. Benjamine Mola today and he thinks she needs to be referred to GI doctor. Please advise

## 2020-12-01 NOTE — Telephone Encounter (Signed)
Referral in epic

## 2020-12-01 NOTE — Telephone Encounter (Signed)
Patient states Dr. Benjamine Mola believes problems with throat has something to do with reflux. Patient would like referral to [facility].

## 2020-12-01 NOTE — Telephone Encounter (Signed)
May go ahead with the GI referral as requested Please notify [place] thank you

## 2020-12-12 NOTE — Telephone Encounter (Signed)
Patient called and left me another message regarding Botox again today. Do you mind calling the patient to go over what Dr. Leonie Man said?

## 2020-12-13 NOTE — Telephone Encounter (Signed)
Called patient back and discussed Dr. Clydene Fake last visit note and message left after first request for botox approval.  Patient stated she has only had about 4 or 5 headaches since July 2021.  She said if she had any additional questions about botox then she would discuss them at her next visit unless it was urgent. Patient expressed appreicaiton for the call.

## 2020-12-18 NOTE — Telephone Encounter (Signed)
Thanks; agree with plan

## 2020-12-26 ENCOUNTER — Telehealth: Payer: Self-pay

## 2020-12-26 NOTE — Telephone Encounter (Signed)
Pt contacted. Pt wanting to know what vitamins to take. Recommended Vit C, Vit D and Zinc and increase fluid. Pt has pulse ox and will keep check on oxygen saturation. Will call office is not getting better. Informed pt to go to ED if feeling lethargic, short of breath or if oxygen below 90%. Pt verbalized understanding.

## 2020-12-26 NOTE — Telephone Encounter (Signed)
Both Marissa Burnett tested positive for covid. They are doing ok, Marissa Burnett just has some questions about vitamins.

## 2020-12-28 ENCOUNTER — Other Ambulatory Visit: Payer: Self-pay

## 2020-12-28 ENCOUNTER — Ambulatory Visit (INDEPENDENT_AMBULATORY_CARE_PROVIDER_SITE_OTHER): Payer: No Typology Code available for payment source | Admitting: Family Medicine

## 2020-12-28 ENCOUNTER — Encounter: Payer: Self-pay | Admitting: Family Medicine

## 2020-12-28 DIAGNOSIS — U071 COVID-19: Secondary | ICD-10-CM | POA: Diagnosis not present

## 2020-12-28 MED ORDER — GUAIFENESIN-CODEINE 100-10 MG/5ML PO SOLN: 5.0000 mL | Freq: Three times a day (TID) | ORAL | 0 refills | Status: DC | PRN

## 2020-12-28 MED ORDER — ALBUTEROL SULFATE HFA 108 (90 BASE) MCG/ACT IN AERS: 2.0000 | INHALATION_SPRAY | Freq: Four times a day (QID) | RESPIRATORY_TRACT | 0 refills | Status: DC | PRN

## 2020-12-28 MED ORDER — BENZONATATE 100 MG PO CAPS: 100.0000 mg | ORAL_CAPSULE | Freq: Two times a day (BID) | ORAL | 0 refills | Status: DC | PRN

## 2020-12-28 NOTE — Progress Notes (Signed)
Patient ID: Marissa Burnett, female    DOB: [birth year], 34 y.o.   MRN: [id number]   Chief Complaint  Patient presents with  . Covid Positive   Subjective:  CC: Covid positive, cough, shortness of breath, sats 98%   This is a new problem.  Presents via video visit with a complaint of being Covid positive, cough, congestion.  Reports that she also has chest tightness, has developed some shortness of breath with activity.  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  She reports that her symptoms are worse today her symptoms started with a dull headache on Sunday and on Monday she had a backache and headache.  Took the home test on Monday and she was positive.  She has used her husband's albuterol inhaler for the chest tightness and shortness of breath which has helped some.  She is also using her incentive spirometer.  She does not appear to be in any acute distress, able to converse with me throughout the video visit.   covid positive. Having ear pressure. Has had some chest tightness and used husband's inhaler.   Virtual Visit via Video Note  I connected with Marissa Burnett on 12/28/20 at 11:00 AM EST by a video enabled telemedicine application and verified that I am speaking with the correct person using two identifiers.  Location: Patient: home Provider: office   I discussed the limitations of evaluation and management by telemedicine and the availability of in person appointments. The patient expressed understanding and agreed to proceed.  History of Present Illness:    Observations/Objective:   Assessment and Plan:   Follow Up Instructions:    I discussed the assessment and treatment plan with the patient. The patient was provided an opportunity to ask questions and all were answered. The patient agreed with the plan and demonstrated an understanding of the instructions.   The patient was advised to call back or  seek an in-person evaluation if the symptoms worsen or if the condition fails to improve as anticipated.  I provided 20 minutes of non-face-to-face time during this encounter.       Medical History Jaziyah has a past medical history of Abnormal Papanicolaou smear of cervix (10/15/2016), Allergy, Anxiety, ASD (atrial septal defect) (07/28/2020), Chronic lumbar pain (09/27/2018), Gallstones, GERD (gastroesophageal reflux disease), Heart murmur (birth), Migraine, and Stroke ([place]) (07/12/2020).   Outpatient Encounter Medications as of 12/28/2020  Medication Sig  . acetaminophen (TYLENOL) 500 MG tablet Take 1,000 mg by mouth daily as needed for headache.   . albuterol (VENTOLIN HFA) 108 (90 Base) MCG/ACT inhaler Inhale 2 puffs into the lungs every 6 (six) hours as needed for wheezing or shortness of breath.  . ALPRAZolam (XANAX) 0.5 MG tablet TAKE 1 TABLET(0.5 MG) BY MOUTH TWICE DAILY AS NEEDED FOR ANXIETY  . aspirin EC 81 MG EC tablet Take 1 tablet (81 mg total) by mouth daily. Swallow whole.  . benzonatate (TESSALON) 100 MG capsule Take 1 capsule (100 mg total) by mouth 2 (two) times daily as needed for cough.  Marland Kitchen BIOTIN PO Take 1 tablet by mouth daily.   . clopidogrel (PLAVIX) 75 MG tablet Take 75 mg by mouth daily.  Marland Kitchen EPINEPHrine 0.3 mg/0.3 mL IJ SOAJ injection Inject 0.3 mLs (0.3 mg total) into the muscle once as needed for up to 1 dose for anaphylaxis.  Marland Kitchen guaiFENesin-codeine 100-10 MG/5ML syrup Take 5 mLs by mouth 3 (three) times daily as  needed for cough. Take at bedtime.  Marland Kitchen loratadine (CLARITIN) 10 MG tablet Take 10 mg by mouth daily.  . metoprolol succinate (TOPROL-XL) 25 MG 24 hr tablet Take 12.5 mg by mouth in the morning and at bedtime.   . Multiple Vitamins-Minerals (EMERGEN-C VITAMIN C) PACK Take 1 packet by mouth daily.  . pantoprazole (PROTONIX) 40 MG tablet TAKE 1 TABLET (40 MG TOTAL) BY MOUTH DAILY.  . Polyvinyl Alcohol-Povidone (REFRESH OP) Place 1 drop into both eyes daily as  needed (dry eyes).  . [DISCONTINUED] penicillin v potassium (VEETID) 500 MG tablet Take 1 tablet (500 mg total) by mouth 3 (three) times daily.   No facility-administered encounter medications on file as of 12/28/2020.     Review of Systems  Constitutional: Negative for chills and fever.  HENT: Positive for congestion.   Respiratory: Positive for cough, chest tightness and shortness of breath.   Musculoskeletal: Positive for back pain.  Neurological: Positive for headaches.     Vitals SpO2 97%  Oxygen sats 98% per patient Objective:   Physical Exam  unable Assessment and Plan   1. COVID-19 virus infection - albuterol (VENTOLIN HFA) 108 (90 Base) MCG/ACT inhaler; Inhale 2 puffs into the lungs every 6 (six) hours as needed for wheezing or shortness of breath.  Dispense: 8 g; Refill: 0 - benzonatate (TESSALON) 100 MG capsule; Take 1 capsule (100 mg total) by mouth 2 (two) times daily as needed for cough.  Dispense: 20 capsule; Refill: 0 - guaiFENesin-codeine 100-10 MG/5ML syrup; Take 5 mLs by mouth 3 (three) times daily as needed for cough. Take at bedtime.  Dispense: 120 mL; Refill: 0    Covid-19 respiratory warning:  Covid-19 is a virus that causes hypoxia (low oxygen level in blood) in some people. If you develop any changes in your usual breathing pattern: difficulty catching your breath, more short winded with activity or with resting, or anything that concerns you about your breathing, do not hesitate to go to the emergency department immediately for evaluation. Please do not delay to get treatment.   Appears to be stable today.  She understands the respiratory COVID-19 warning she will continue to monitor her pulse ox and reports that if she gets to 93% she will go to the emergency department.  Medications given to manage her symptoms.  She needs her own albuterol inhaler, and medications for cough.  She will continue to use her incentive spirometer and to monitor her  respiratory status.  Agrees with plan of care discussed today. Understands warning signs to seek further care: Chest pain, shortness of breath, any significant change in respiratory status she will seek emergency care immediately. Understands to follow-up if symptoms do not improve, or worsen.  Dorena Bodo, FNP-C 12/28/2020

## 2021-01-01 ENCOUNTER — Other Ambulatory Visit: Payer: Self-pay | Admitting: Family Medicine

## 2021-01-01 ENCOUNTER — Encounter: Payer: Self-pay | Admitting: Family Medicine

## 2021-01-01 MED FILL — PANTOPRAZOLE SOD DR 40 MG T: 40 | 90 days supply | Qty: 90 | Fill #0

## 2021-01-01 MED FILL — FAMOTIDINE 20 MG TABS: 20 | 30 days supply | Qty: 30 | Fill #1

## 2021-01-02 ENCOUNTER — Encounter: Payer: Self-pay | Admitting: *Deleted

## 2021-01-02 ENCOUNTER — Other Ambulatory Visit: Payer: Self-pay

## 2021-01-02 ENCOUNTER — Ambulatory Visit (HOSPITAL_COMMUNITY)
Admission: RE | Admit: 2021-01-02 | Discharge: 2021-01-02 | Disposition: A | Discharge status: Home / Self Care | Payer: No Typology Code available for payment source | Source: Ambulatory Visit | Attending: Family Medicine | Admitting: Family Medicine

## 2021-01-02 ENCOUNTER — Other Ambulatory Visit: Payer: Self-pay | Admitting: *Deleted

## 2021-01-02 DIAGNOSIS — R0602 Shortness of breath: Secondary | ICD-10-CM

## 2021-01-09 ENCOUNTER — Telehealth: Payer: Self-pay | Admitting: Neurology

## 2021-01-09 NOTE — Telephone Encounter (Signed)
Pt called, started having migraines on Friday 1/7, took Tylenol.  Have had a migraine since Sunday. Would like a call from the nurse.

## 2021-01-10 ENCOUNTER — Other Ambulatory Visit: Payer: Self-pay | Admitting: Neurology

## 2021-01-10 DIAGNOSIS — Q2112 Patent foramen ovale: Secondary | ICD-10-CM

## 2021-01-10 DIAGNOSIS — Q211 Atrial septal defect: Secondary | ICD-10-CM

## 2021-01-10 MED ORDER — TOPIRAMATE 25 MG PO TABS: 25.0000 mg | ORAL_TABLET | Freq: Every day | ORAL | 2 refills | Status: DC

## 2021-01-10 NOTE — Telephone Encounter (Signed)
I called and spoke to the patient.  She has been having a headache for the last 7 -10days since she had COVID which responds to Tylenol but keeps on coming back.  This is different from her usual migraines as her associated visual symptoms and not there with all these headaches.  She also had transient numbness which was likely reactivation of old stroke symptoms in the setting of an acute illness.  Recommend to start taking Topamax 25 mg twice daily and continue to use Tylenol for symptomatic relief but limited to not more than two doses a day and not more than 2 days/week.  She was advised to call back if diuretics get worse or she does not respond to Topamax.  I also ordered a transcranial Doppler bubble study to look for adequacy of PFO closure.  She voiced understanding.

## 2021-01-10 NOTE — Telephone Encounter (Signed)
Patient states she was covid+, her 10 days quarantine was up last Thursday, 01/04/21,  however she has also developed a headache since being diagnosed with covid.  Also states she had a "Heart device" placed in September and then today she started having numbness and tingling in her left arm just like she did after she had the device placed, recommended she call Dr. Burt Knack who placed her device.   States she has a headache now, she was off for 17 days and came back to work on Monday.  She hadn't worn a mask and now that she is wearing a mask she is having a headache in the evenings now.  She is using an ice pack for the headache as well as tylenol. Patient stated she woke up Sunday night with her heart racing and a headache.

## 2021-01-21 ENCOUNTER — Other Ambulatory Visit: Payer: Self-pay | Admitting: Family Medicine

## 2021-01-21 DIAGNOSIS — U071 COVID-19: Secondary | ICD-10-CM

## 2021-01-22 MED FILL — CLOPIDOGREL 75 MG TABLET: 75 | 90 days supply | Qty: 90 | Fill #3

## 2021-01-22 MED FILL — ASPIRIN LOW DOSE 81 MG TBEC: 81 | 90 days supply | Qty: 90 | Fill #4

## 2021-01-23 ENCOUNTER — Other Ambulatory Visit: Payer: Self-pay | Admitting: Family Medicine

## 2021-02-01 ENCOUNTER — Encounter: Payer: Self-pay | Admitting: Family Medicine

## 2021-02-01 NOTE — Telephone Encounter (Signed)
Nurses Please send them ophthalmic erythromycin ointment may use twice daily as needed, 1 tube with 2 refills  I do not feel that the aspirin or Plavix are causing problems such as this  Certainly sorry she is having so frequent of issue  If any ongoing trouble let us know thanks

## 2021-02-05 ENCOUNTER — Other Ambulatory Visit: Payer: Self-pay | Admitting: *Deleted

## 2021-02-05 MED ORDER — ERYTHROMYCIN 5 MG/GM OP OINT: TOPICAL_OINTMENT | OPHTHALMIC | 1 refills | Status: DC

## 2021-02-05 MED FILL — FAMOTIDINE 20 MG TABS: 20 | 30 days supply | Qty: 30 | Fill #2

## 2021-02-05 NOTE — Addendum Note (Signed)
Addended by: Vicente Males on: 02/05/2021 03:18 PM   Modules accepted: Orders

## 2021-02-12 ENCOUNTER — Ambulatory Visit: Payer: No Typology Code available for payment source | Admitting: Internal Medicine

## 2021-02-22 ENCOUNTER — Other Ambulatory Visit: Payer: Self-pay

## 2021-02-22 ENCOUNTER — Ambulatory Visit (HOSPITAL_COMMUNITY)
Admission: RE | Admit: 2021-02-22 | Discharge: 2021-02-22 | Disposition: A | Discharge status: Home / Self Care | Payer: No Typology Code available for payment source | Source: Ambulatory Visit | Attending: Neurology | Admitting: Neurology

## 2021-02-22 DIAGNOSIS — Q2112 Patent foramen ovale: Secondary | ICD-10-CM

## 2021-02-22 DIAGNOSIS — Q211 Atrial septal defect: Secondary | ICD-10-CM | POA: Insufficient documentation

## 2021-02-22 NOTE — Progress Notes (Signed)
TCD bubble study has been completed.   Preliminary results in CV Proc.   Abram Sander 02/22/2021 1:22 PM

## 2021-02-27 ENCOUNTER — Other Ambulatory Visit (HOSPITAL_COMMUNITY): Payer: No Typology Code available for payment source

## 2021-03-02 NOTE — Progress Notes (Signed)
Kindly inform the patient that TCD bubble study was negative for right-to-left shunt or PFO

## 2021-03-05 ENCOUNTER — Other Ambulatory Visit: Payer: Self-pay | Admitting: *Deleted

## 2021-03-05 ENCOUNTER — Encounter: Payer: Self-pay | Admitting: *Deleted

## 2021-03-05 ENCOUNTER — Telehealth: Payer: Self-pay | Admitting: Family Medicine

## 2021-03-05 DIAGNOSIS — Z0000 Encounter for general adult medical examination without abnormal findings: Secondary | ICD-10-CM

## 2021-03-05 DIAGNOSIS — Z13220 Encounter for screening for lipoid disorders: Secondary | ICD-10-CM

## 2021-03-05 NOTE — Telephone Encounter (Signed)
I would recommend lipid BMP, vitamin D no need for any other labs unless patient requests

## 2021-03-05 NOTE — Telephone Encounter (Signed)
Pt does not want any additional test. Orders put in and pt notified.

## 2021-03-05 NOTE — Telephone Encounter (Signed)
Patient scheduled physical for 4/28 and needing labs done

## 2021-03-05 NOTE — Telephone Encounter (Signed)
Last labs 11/16/18 ordered by dr scott - crp, ana, sed rate, hla b27 antigen

## 2021-03-07 ENCOUNTER — Other Ambulatory Visit: Payer: Self-pay

## 2021-03-07 ENCOUNTER — Ambulatory Visit: Payer: No Typology Code available for payment source | Admitting: Cardiovascular Disease

## 2021-03-07 ENCOUNTER — Ambulatory Visit (HOSPITAL_COMMUNITY): Payer: No Typology Code available for payment source | Attending: Cardiology

## 2021-03-07 ENCOUNTER — Encounter: Payer: Self-pay | Admitting: Cardiovascular Disease

## 2021-03-07 VITALS — BP 120/82 | HR 94 | Wt 162.0 lb

## 2021-03-07 DIAGNOSIS — R002 Palpitations: Secondary | ICD-10-CM | POA: Diagnosis not present

## 2021-03-07 DIAGNOSIS — Q211 Atrial septal defect, unspecified: Secondary | ICD-10-CM

## 2021-03-07 LAB — ECHOCARDIOGRAM LIMITED BUBBLE STUDY
Area-P 1/2: 3.95 cm2
S' Lateral: 2.6 cm

## 2021-03-07 MED ORDER — SODIUM CHLORIDE 0.9% FLUSH: 10.0000 mL | INTRAVENOUS | Status: AC | PRN

## 2021-03-07 NOTE — Progress Notes (Signed)
Cardiology Office Note:    Date:  03/08/2021   ID:  Marissa Burnett, DOB [birth year], MRN [id number]  PCP:  [name], [place]  Cardiologist:  Sherren Mocha, MD  Advanced Practice Provider:  No care team member to display Electrophysiologist:  None       Referring MD: [name], MD   Chief Complaint  Patient presents with  . Atrial Septal Defect    History of Present Illness:    Marissa Burnett is a 35 y.o. female with a hx of:  Hx of Migraine HAs w/ Aura  Hx of L MCA territory CVA (2-3 punctate ischemic infarcts)  Ostium Secundum ASD  ? S/p closure 08/30/2020   Nickel allergy   Incomplete RBBB  Premature atrial contractions, symptomatic ? Briefly admitted 08/2020 after ASD closure ? Seen by EP 09/2020 >> beta-blocker Rx   The patient is here with her husband today.  She is doing well.  An echocardiogram was performed just prior to her office visit.  Her palpitations have improved and she has weaned herself off of beta-blocker therapy.  She has an occasional focal left-sided chest pain.  No shortness of breath.  Occasional palpitations but improved from previous.  No edema, orthopnea, or PND.  Overall doing well.  Past Medical History:  Diagnosis Date  . Abnormal Papanicolaou smear of cervix 10/15/2016   LSIL+HPV  . Allergy   . Anxiety   . ASD (atrial septal defect) 07/28/2020  . Chronic lumbar pain 09/27/2018   Patient has seen Dr. Arnoldo Morale neurosurgery September 2019-they do not feel there is anything surgical that can help-they are recommending injections with pain medicine specialist  . Gallstones   . GERD (gastroesophageal reflux disease)   . Heart murmur birth  . Migraine   . Stroke ([place]) 07/12/2020   a. L MCA stroke in 06/2020.    Past Surgical History:  Procedure Laterality Date  . ABDOMINOPLASTY    . ATRIAL SEPTAL DEFECT(ASD) CLOSURE N/A 08/30/2020   Procedure: ATRIAL SEPTAL DEFECT (ASD) CLOSURE;  Surgeon:  Sherren Mocha, MD;  Location: [facility];  Service: Cardiovascular;  Laterality: N/A;  . BREAST ENHANCEMENT SURGERY    . CERVICAL CONIZATION W/BX N/A 12/13/2016   Procedure: CONIZATION CERVIX WITH BIOPSY--cold knife conization;  Surgeon: Jonnie Kind, MD;  Location: [facility];  Service: Gynecology;  Laterality: N/A;  . CHOLECYSTECTOMY  02/2002  . ENDOSCOPIC RETROGRADE CHOLANGIOPANCREATOGRAPHY (ERCP) WITH PROPOFOL    . OTHER SURGICAL HISTORY     stent in kidney during pregnancy,2002; removed in 2003  . TEE WITHOUT CARDIOVERSION N/A 08/04/2020   Procedure: TRANSESOPHAGEAL ECHOCARDIOGRAM (TEE);  Surgeon: Larey Dresser, MD;  [facility];  Service: Cardiovascular;  Laterality: N/A;    Current Medications: Current Meds  Medication Sig  . acetaminophen (TYLENOL) 500 MG tablet Take 1,000 mg by mouth daily as needed for headache.   . ALPRAZolam (XANAX) 0.5 MG tablet TAKE 1 TABLET(0.5 MG) BY MOUTH TWICE DAILY AS NEEDED FOR ANXIETY  . aspirin EC 81 MG EC tablet Take 1 tablet (81 mg total) by mouth daily. Swallow whole.  Marland Kitchen BIOTIN PO Take 1 tablet by mouth daily.   Marland Kitchen EPINEPHrine 0.3 mg/0.3 mL IJ SOAJ injection Inject 0.3 mLs (0.3 mg total) into the muscle once as needed for up to 1 dose for anaphylaxis.  . famotidine (PEPCID) 20 MG tablet Take 20 mg by mouth at bedtime.  Marland Kitchen guaiFENesin-codeine 100-10 MG/5ML syrup  Take 5 mLs by mouth 3 (three) times daily as needed for cough. Take at bedtime.  Marland Kitchen loratadine (CLARITIN) 10 MG tablet Take 10 mg by mouth daily.  . pantoprazole (PROTONIX) 40 MG tablet TAKE 1 TABLET (40 MG TOTAL) BY MOUTH DAILY.  . Polyvinyl Alcohol-Povidone (REFRESH OP) Place 1 drop into both eyes daily as needed (dry eyes).  . [DISCONTINUED] clopidogrel (PLAVIX) 75 MG tablet Take 75 mg by mouth daily.     Allergies:   Peanut-containing drug products   Social History   Socioeconomic History  . Marital status: Married    Spouse name: Not on file  . Number of  children: 2  . Years of education: Not on file  . Highest education level: Not on file  Occupational History  . Occupation: resp therapist    Employer: [employer]  Tobacco Use  . Smoking status: Never Smoker  . Smokeless tobacco: Never Used  Vaping Use  . Vaping Use: Never used  Substance and Sexual Activity  . Alcohol use: Not Currently  . Drug use: No  . Sexual activity: Yes    Birth control/protection: None  Other Topics Concern  . Not on file  Social History Narrative   Married, respiratory therapist [employer] [employer]   2 children   Occasional alcohol, never smoker no drug use   Right handed   Drinks 1-2 cups coffee daily   Social Determinants of Health   Financial Resource Strain: Not on file  Food Insecurity: Not on file  Transportation Needs: Not on file  Physical Activity: Not on file  Stress: Not on file  Social Connections: Not on file     Family History: The patient's family history includes ADD / ADHD in her daughter; Anxiety disorder in her daughter; COPD in her maternal grandfather; Colon polyps in her paternal grandmother; Diabetes in her maternal grandmother and another family member; Heart disease in her paternal grandmother; Irritable bowel syndrome in her paternal grandmother; Lung cancer in her paternal grandfather. There is no history of Allergic rhinitis, Angioedema, Asthma, Atopy, Eczema, Immunodeficiency, or Urticaria.  ROS:   Please see the history of present illness.    All other systems reviewed and are negative.  EKGs/Labs/Other Studies Reviewed:    The following studies were reviewed today: Transcranial Doppler 02/22/2021: Summary:  No HITS at rest or during Valsalva. Negative transcranial Doppler Bubble  study with no evidence of right to left intracardiac communication.     A vascular evaluation was performed. The right middle cerebral artery was  studied. An IV was inserted into the patient's left forearm. Verbal  informed  consent was obtained.   Echo 03/07/21: IMPRESSIONS    1. Patient is s/p closure of secundum ASD with an aplatzer occluder  device. Agitated saline study very limited and unable to detect if there  is residual shunting based on available views. No shunting seen by color  doppler.  2. Left ventricular ejection fraction, by estimation, is 65 to 70%. The  left ventricle has normal function. Left ventricular diastolic parameters  were normal.  3. Right ventricular systolic function is normal. The right ventricular  size is normal.  4. The mitral valve is normal in structure. No evidence of mitral valve  regurgitation.  5. The aortic valve is tricuspid. Aortic valve regurgitation is not  visualized. No aortic stenosis is present.  6. The inferior vena cava is normal in size with greater than 50%  respiratory variability, suggesting right atrial pressure of 3 mmHg.  Comparison(s): No significant change from prior study.   EKG:  EKG is not ordered today.    Recent Labs: 07/27/2020: ALT 13 09/25/2020: Hemoglobin 13.7; Platelets 230; TSH 0.576 09/26/2020: BUN 10; Creatinine, Ser 0.60; Magnesium 2.2; Potassium 3.8; Sodium 139  Recent Lipid Panel    Component Value Date/Time   CHOL 135 07/28/2020 0351   CHOL 146 10/14/2018 1026   TRIG 46 07/28/2020 0351   HDL 57 07/28/2020 0351   HDL 64 10/14/2018 1026   CHOLHDL 2.4 07/28/2020 0351   VLDL 9 07/28/2020 0351   LDLCALC 69 07/28/2020 0351   LDLCALC 69 10/14/2018 1026     Risk Assessment/Calculations:       Physical Exam:    VS:  BP 120/82   Pulse 94   Wt 162 lb (73.5 kg) Comment: per patient weight "refused" scale  SpO2 99%   BMI 27.81 kg/m     Wt Readings from Last 3 Encounters:  03/07/21 162 lb (73.5 kg)  11/25/20 163 lb (73.9 kg)  11/09/20 166 lb 12.8 oz (75.7 kg)     GEN:  Well nourished, well developed in no acute distress HEENT: Normal NECK: No JVD; No carotid bruits LYMPHATICS: No lymphadenopathy CARDIAC:  RRR, no murmurs, rubs, gallops RESPIRATORY:  Clear to auscultation without rales, wheezing or rhonchi  ABDOMEN: Soft, non-tender, non-distended MUSCULOSKELETAL:  No edema; No deformity  SKIN: Warm and dry NEUROLOGIC:  Alert and oriented x 3 PSYCHIATRIC:  Normal affect   ASSESSMENT:    1. ASD (atrial septal defect)   2. Palpitations    PLAN:    In order of problems listed above:  1. The patient's atrial septal occluder is in stable position.  I have reviewed her echo images today.  LF function is normal, there is no valvular disease, and no pericadial effusion. She is now 6 months out from closure and can stop clopidogrel.  She should remain on aspirin 81 mg daily with her history of stroke.  She will follow-up in 1 year with an echocardiogram at that time. 2. Improved, has weaned herself off of metoprolol.  Overall appears to be doing very well.        Medication Adjustments/Labs and Tests Ordered: Current medicines are reviewed at length with the patient today.  Concerns regarding medicines are outlined above.  No orders of the defined types were placed in this encounter.  No orders of the defined types were placed in this encounter.   Patient Instructions  Medication Instructions:  1) STOP PLAVIX *If you need a refill on your cardiac medications before your next appointment, please call your pharmacy*  Follow-Up: You will be called to arrange your 1 year echo and office visit with Dr. Burt Knack when his schedule is available.     Signed, Sherren Mocha, MD  03/08/2021 4:29 PM    [place]

## 2021-03-07 NOTE — Patient Instructions (Signed)
Medication Instructions:  1) STOP PLAVIX *If you need a refill on your cardiac medications before your next appointment, please call your pharmacy*  Follow-Up: You will be called to arrange your 1 year echo and office visit with Dr. Burt Knack when his schedule is available.

## 2021-03-08 ENCOUNTER — Encounter: Payer: Self-pay | Admitting: Cardiovascular Disease

## 2021-03-09 ENCOUNTER — Telehealth: Payer: Self-pay

## 2021-03-09 ENCOUNTER — Other Ambulatory Visit (HOSPITAL_COMMUNITY): Payer: Self-pay | Admitting: Pharmacist

## 2021-03-09 MED FILL — CARESTART COVID-19 HOME TES: 4 days supply | Qty: 4 | Fill #0

## 2021-03-09 MED FILL — FAMOTIDINE 20 MG TABS: 20 | 30 days supply | Qty: 30 | Fill #3

## 2021-03-09 NOTE — Telephone Encounter (Signed)
Returned pt's call; left voicemail to call back.

## 2021-03-23 ENCOUNTER — Telehealth: Payer: Self-pay | Admitting: Cardiovascular Disease

## 2021-03-23 ENCOUNTER — Telehealth: Payer: Self-pay

## 2021-03-23 NOTE — Telephone Encounter (Signed)
Marissa Burnett with [facility] states the patient is requesting to have sclerotherapy with them. She would like to know if the patient is able to hold her aspirin for the sclerotherapy prior to scheduling.  Please return call to [place] to discuss at [phone].

## 2021-03-23 NOTE — Telephone Encounter (Signed)
   [facility] Pre-operative Risk Assessment    HEARTCARE STAFF: - Please ensure there is not already an duplicate clearance open for this procedure. - Under Visit Info/Reason for Call, type in Other and utilize the format Clearance MM/DD/YY or Clearance TBD. Do not use dashes or single digits. - If request is for dental extraction, please clarify the # of teeth to be extracted.  Request for surgical clearance:  1. What type of surgery is being performed?  SCLEROTHERAPY    2. When is this surgery scheduled?  TBD   3. What type of clearance is required (medical clearance vs. Pharmacy clearance to hold med vs. Both)?  BOTH  4. Are there any medications that need to be held prior to surgery and how long? ASPIRIN 2 DAYS    5. Practice name and name of physician performing surgery?  [facility] / DR. FIELDS   6. What is the office phone number?  [phone]   7.   What is the office fax number?  [phone]  8.   Anesthesia type (None, local, MAC, general) ?  NONE    Marissa Burnett 03/23/2021, 3:11 PM  _________________________________________________________________   (provider comments below)

## 2021-03-23 NOTE — Telephone Encounter (Signed)
Pt is interested in sclerotherapy and has a history of TIA and ASD closure from last year. I have left a message with [facility] to see if she is able to have sclerotherapy and if she can hold 81mg  Aspirin prior to having a treatment.

## 2021-03-26 NOTE — Telephone Encounter (Signed)
   Primary Cardiologist: Sherren Mocha, MD  Chart reviewed as part of pre-operative protocol coverage. Given past medical history and time since last visit, based on ACC/AHA guidelines, Marissa Burnett would be at acceptable risk  From a medical standpoint for the planned procedure without further cardiovascular testing.   The patient has a hx of ASD closure 08/30/2020. Also carries a hx of CVA. She was seen by Dr. Burt Knack 03/07/2021 and was taken off Plavix however given her hx of CVA it was recommended that she remain on ASA. I would recommend that ASA holding recommendations come from the patients neurology team.   The patient was advised that if she develops new symptoms prior to surgery to contact our office to arrange for a follow-up visit, and she verbalized understanding.  I will route this recommendation to the requesting party via Epic fax function and remove from pre-op pool.  Please call with questions.  [name], NP 03/26/2021, 8:49 AM

## 2021-03-29 ENCOUNTER — Encounter: Payer: Self-pay | Admitting: Internal Medicine

## 2021-03-29 ENCOUNTER — Ambulatory Visit (INDEPENDENT_AMBULATORY_CARE_PROVIDER_SITE_OTHER): Payer: No Typology Code available for payment source | Admitting: Internal Medicine

## 2021-03-29 VITALS — BP 108/70 | HR 90 | Ht 64.0 in | Wt 168.2 lb

## 2021-03-29 DIAGNOSIS — E663 Overweight: Secondary | ICD-10-CM

## 2021-03-29 DIAGNOSIS — K219 Gastro-esophageal reflux disease without esophagitis: Secondary | ICD-10-CM

## 2021-03-29 MED ORDER — PANTOPRAZOLE SODIUM 20 MG PO TBEC: 20.0000 mg | DELAYED_RELEASE_TABLET | Freq: Every day | ORAL | 3 refills | Status: DC

## 2021-03-29 NOTE — Patient Instructions (Signed)
Glad you are better regarding TIA's  Will try to reduce pantoprazole to 20 mg - alternate 20 and 40 for a while and then go to 20 mg daily.  Stop Pepcid.   Take a look at the eating information I have provided - and good idea to not eat after 6 PM.  Avoiding processed foods will help a lot.  Read about TIF again also.  I appreciate the opportunity to care for you. Gatha Mayer, MD, [facility] [facility]

## 2021-03-29 NOTE — Progress Notes (Signed)
JACKY HARTUNG 35 y.o. 1986-10-18 [id number]  Assessment & Plan:   Encounter Diagnoses  Name Primary?  . Gastroesophageal reflux disease, unspecified whether esophagitis present Yes  . Overweight (BMI 25.0-29.9)   Modify medication regimen as below.  She may need to taper a little bit going 40 and 20 mg alternating on the pantoprazole.  Try to get the lowest effective dose.  We will consider an endoscopy if she is considering TIF procedure.  I have given her information on that again.  She does not have abdominal obesity but she is overweight and weight loss might help so I have given her some information about lower carb eating.  She has already started on time restricted eating and I have included information about that as well as intermittent fasting.  She will return in about 2 months.  June 15, 2021. Meds ordered this encounter  Medications  . pantoprazole (PROTONIX) 20 MG tablet    Sig: Take 1 tablet (20 mg total) by mouth daily before breakfast.    Dispense:  90 tablet    Refill:  3     Medications Discontinued During This Encounter  Medication Reason  . pantoprazole (PROTONIX) 40 MG tablet   . famotidine (PEPCID) 20 MG tablet Discontinued by provider   We neglected to discuss her bowel issues today she is treating her constipation with over-the-counter herbal products.  I will send her a message about that.   I appreciate the opportunity to care for this patient. CC: [name], MD    Subjective:   Chief Complaint: Reflux constipation  HPI Makensie is a 35 year old white woman seen in the past for reflux disease most recently on August 17, 2019.  At that time I have recommended she continue PPI she was able to get off PPI but then she had Covid and was quite ill and started having more reflux symptoms and is now back on Protonix 40 mg daily and Pepcid 20 mg at bedtime.  She had seen Dr. Benjamine Mola who thought her laryngoscopy looked okay but because of a globus  sensation she had been having he recommended she go back on Pepcid in addition to the Protonix.  At this point she feels like her symptoms are well controlled.  She would like not to take medicine if possible.  We had previously discussed the TIF procedure as a possible option.  She recently decided to stop eating after 6 PM in an effort to try to control weight.  As far as reflux triggers caffeine with 1 cup of coffee or so a day.  She does not drink sodas.  Globus sensation is gone.  Wt Readings from Last 3 Encounters:  03/29/21 168 lb 3.2 oz (76.3 kg)  03/07/21 162 lb (73.5 kg)  11/25/20 163 lb (73.9 kg)   Recent past medical history notable for several TIAs and a stroke after I had seen her in 2020 and it turns out she had an atrial septal defect that was closed by Dr. Burt Knack.  She had been on Plavix and aspirin at one point and is now just on a baby aspirin daily.  Allergies  Allergen Reactions  . Peanut-Containing Drug Products Anaphylaxis    THROAT GETS TIGHT, peanut butter   Current Meds  Medication Sig  . acetaminophen (TYLENOL) 500 MG tablet Take 1,000 mg by mouth daily as needed for headache.   . ALPRAZolam (XANAX) 0.5 MG tablet TAKE 1 TABLET(0.5 MG) BY MOUTH TWICE DAILY AS NEEDED FOR  ANXIETY  . aspirin EC 81 MG EC tablet Take 1 tablet (81 mg total) by mouth daily. Swallow whole.  Marland Kitchen BIOTIN PO Take 1 tablet by mouth daily.   Marland Kitchen EPINEPHrine 0.3 mg/0.3 mL IJ SOAJ injection Inject 0.3 mLs (0.3 mg total) into the muscle once as needed for up to 1 dose for anaphylaxis.  . famotidine (PEPCID) 20 MG tablet Take 20 mg by mouth at bedtime.  Marland Kitchen guaiFENesin-codeine 100-10 MG/5ML syrup Take 5 mLs by mouth 3 (three) times daily as needed for cough. Take at bedtime.  Marland Kitchen loratadine (CLARITIN) 10 MG tablet Take 10 mg by mouth daily.  . pantoprazole (PROTONIX) 40 MG tablet TAKE 1 TABLET (40 MG TOTAL) BY MOUTH DAILY.  . Polyvinyl Alcohol-Povidone (REFRESH OP) Place 1 drop into both eyes daily as  needed (dry eyes).   Past Medical History:  Diagnosis Date  . Abnormal Papanicolaou smear of cervix 10/15/2016   LSIL+HPV  . Allergy   . Anxiety   . ASD (atrial septal defect) 07/28/2020  . Chronic lumbar pain 09/27/2018   Patient has seen Dr. Arnoldo Morale neurosurgery September 2019-they do not feel there is anything surgical that can help-they are recommending injections with pain medicine specialist  . Gallstones   . GERD (gastroesophageal reflux disease)   . Heart murmur birth  . Migraine   . Stroke ([place]) 07/12/2020   a. L MCA stroke in 06/2020.  Marland Kitchen TIA (transient ischemic attack)    Past Surgical History:  Procedure Laterality Date  . ABDOMINOPLASTY    . ATRIAL SEPTAL DEFECT(ASD) CLOSURE N/A 08/30/2020   Procedure: ATRIAL SEPTAL DEFECT (ASD) CLOSURE;  Surgeon: Sherren Mocha, MD;  Location: [facility] [facility];  Service: Cardiovascular;  Laterality: N/A;  . BREAST ENHANCEMENT SURGERY    . CERVICAL CONIZATION W/BX N/A 12/13/2016   Procedure: CONIZATION CERVIX WITH BIOPSY--cold knife conization;  Surgeon: Jonnie Kind, MD;  Location: [facility];  Service: Gynecology;  Laterality: N/A;  . CHOLECYSTECTOMY  02/2002  . ENDOSCOPIC RETROGRADE CHOLANGIOPANCREATOGRAPHY (ERCP) WITH PROPOFOL    . OTHER SURGICAL HISTORY     stent in kidney during pregnancy,2002; removed in 2003  . 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   Review of Systems As per HPI  Objective:   Physical Exam BP 108/70   Pulse 90   Ht 5\' 4"  (1.626 m)   Wt 168 lb 3.2 oz (76.3 kg)   SpO2 99%   BMI 28.87 kg/m

## 2021-04-05 ENCOUNTER — Other Ambulatory Visit: Payer: Self-pay

## 2021-04-05 ENCOUNTER — Ambulatory Visit (INDEPENDENT_AMBULATORY_CARE_PROVIDER_SITE_OTHER): Payer: No Typology Code available for payment source | Admitting: Neurology

## 2021-04-05 VITALS — BP 128/67 | HR 97 | Ht 64.0 in | Wt 168.2 lb

## 2021-04-05 DIAGNOSIS — G43109 Migraine with aura, not intractable, without status migrainosus: Secondary | ICD-10-CM | POA: Diagnosis not present

## 2021-04-05 DIAGNOSIS — I6990 Unspecified sequelae of unspecified cerebrovascular disease: Secondary | ICD-10-CM

## 2021-04-05 NOTE — Progress Notes (Signed)
[facility] [address] [place]. [place] [address] [phone]       OFFICE FOLLOW UP VISIT NOTE  Marissa Burnett Date of Birth:  07-Jul-1986 Medical Record Number:  [id number]   Referring MD: Rosalin Hawking  Reason for Referral: Stroke and migraine  HPI: Initial visit 10/19/2020 Marissa Burnett is a 35 year old Caucasian lady seen today for initial office consultation visit for stroke and migraine.  History is obtained from the patient, review of electronic medical records and I personally reviewed available imaging films in PACS. She has no past medical history except for migraines who presented to [facility] emergency room on 07/27/2020 with report of an abnormal MRI scan.  She stated 2 weeks ago she had an episode where she became lightheaded and had binocular blurred vision and generalized weakness.  She tried to wash her hands with could not feel the water and she had some numbness in the hands.  This occurred while she was at work and her coworkers also noted that she looked pale.  She also had some word finding difficulties and trouble texting while using her phone.  She notices slight headache.  She does have prior history of ocular migraines in the past which involved mostly blurred vision as well as seeing zigzag lines.  She had done well and not had any of those for years.  However since July she has been having them at least once or twice a month.  MRI scan of the brain done as an outpatient showed 3 small diffusion positive cortical lesions in the left hemisphere and frontal and parietal lobes.  Spinal tap was obtained which was normal for protein glucose and inflammatory markers.  She underwent lab work for vasculitic states as well as hypercoagulable states and it was all normal.  Transcranial Doppler bubble study showed evidence of right-to-left shunt and TEE confirmed that to be ostium secundum atrial septal defect.  Lower extremity venous Dopplers were negative for  DVT.  Patient subsequently had elective closure of that by Dr. Burt Knack on 08/30/2020.  She was placed on aspirin and Plavix and return to the ER on 09/24/2020 with frequent PACs.  She was monitored for 2 days with no definite atrial fibrillation was found.  She wore a Zio patch as well which showed mostly ectopic beats.  She was placed on beta-blockers but developed severe bradycardia and she is in the process of tapering and stopping it.  She states she is still had 2 episodes of migraine since her ASD closure 1 on September 13 and 1 on October 2.  She sees zigzag lines and has extreme sensitivity to light and sound and vision gets a little blurred.  This lasted about 20 minutes.  She is tolerating aspirin Plavix without bruising or bleeding.  Blood pressure is well controlled today it is 129/88.  She is not on a statin and her LDL cholesterol was 69 mg percent in July and hemoglobin A1c was 4.6.  She has no new complaints today. Update 04/05/2021: She returns for follow-up after last visit 5 months ago.  She is doing well.  She had no recurrent stroke or TIA symptoms.  She had Covid infection in January 2022 and had migraine with aura following that but that has had no further migraine episodes since then.  She had transcranial Doppler bubble study on 02/22/2021 which showed no evidence of right-to-left hits and complete closure of her right to left intracardiac shunt following endovascular closure.  She also  had a 2D echo bubble study on 03/07/2021 which also showed closure of her septum second ASD with Amplatzer occluder device and negative agitated saline bubble study.  Patient states she is doing better now and is not as anxious and is stopped taking Xanax as frequently uses it only as needed.  She is tolerating aspirin well without bruising or bleeding.  Blood pressures well controlled today it is 128/67.  She has no new complaints. ROS:   14 system review of systems is positive for headache, blurred vision,  numbness, confusion all other systems negative PMH:  Past Medical History:  Diagnosis Date  . Abnormal Papanicolaou smear of cervix 10/15/2016   LSIL+HPV  . Allergy   . Anxiety   . ASD (atrial septal defect) 07/28/2020  . Chronic lumbar pain 09/27/2018   Patient has seen Dr. Arnoldo Morale neurosurgery September 2019-they do not feel there is anything surgical that can help-they are recommending injections with pain medicine specialist  . COVID   . Gallstones   . GERD (gastroesophageal reflux disease)   . Heart murmur birth  . Migraine   . Stroke ([place]) 07/12/2020   a. L MCA stroke in 06/2020.  Marland Kitchen TIA (transient ischemic attack)     Social History:  Social History   Socioeconomic History  . Marital status: Married    Spouse name: Not on file  . Number of children: 2  . Years of education: Not on file  . Highest education level: Not on file  Occupational History  . Occupation: resp therapist    Employer: [employer]  Tobacco Use  . Smoking status: Never Smoker  . Smokeless tobacco: Never Used  Vaping Use  . Vaping Use: Never used  Substance and Sexual Activity  . Alcohol use: Not Currently    Comment: rare  . Drug use: No  . Sexual activity: Yes    Birth control/protection: None  Other Topics Concern  . Not on file  Social History Narrative   Married, respiratory therapist [facility]   2 children   Occasional alcohol, never smoker no drug use   Right handed   Drinks 1-2 cups coffee daily   Social Determinants of Health   Financial Resource Strain: Not on file  Food Insecurity: Not on file  Transportation Needs: Not on file  Physical Activity: Not on file  Stress: Not on file  Social Connections: Not on file  Intimate Partner Violence: Not on file    Medications:   Current Outpatient Medications on File Prior to Visit  Medication Sig Dispense Refill  . acetaminophen (TYLENOL) 500 MG tablet Take 1,000 mg by mouth daily as needed for headache.     .  ALPRAZolam (XANAX) 0.5 MG tablet TAKE 1 TABLET(0.5 MG) BY MOUTH TWICE DAILY AS NEEDED FOR ANXIETY 30 tablet 1  . aspirin 81 MG EC tablet TAKE 1 TABLET (81 MG TOTAL) BY MOUTH DAILY. SWALLOW WHOLE. (Patient taking differently: Take by mouth 5 (five) times daily. SWALLOW WHOLE.) 30 tablet 11  . BIOTIN PO Take 1 tablet by mouth daily.     Marland Kitchen EPINEPHrine 0.3 mg/0.3 mL IJ SOAJ injection Inject 0.3 mLs (0.3 mg total) into the muscle once as needed for up to 1 dose for anaphylaxis. 2 each 1  . famotidine (PEPCID) 20 MG tablet TAKE 1 TABLET BY MOUTH AT BEDTIME 30 tablet 3  . loratadine (CLARITIN) 10 MG tablet Take 10 mg by mouth daily.    . pantoprazole (PROTONIX) 20 MG tablet Take  1 tablet (20 mg total) by mouth daily before breakfast. 90 tablet 3  . Polyvinyl Alcohol-Povidone (REFRESH OP) Place 1 drop into both eyes daily as needed (dry eyes).     Current Facility-Administered Medications on File Prior to Visit  Medication Dose Route Frequency Provider Last Rate Last Admin  . sodium chloride flush (NS) 0.9 % injection 10 mL  10 mL Intravenous PRN Sherren Mocha, MD        Allergies:   Allergies  Allergen Reactions  . Peanut-Containing Drug Products Anaphylaxis    THROAT GETS TIGHT, peanut butter    Physical Exam General: well developed, well nourished young pleasant Caucasian lady, seated, in no evident distress Head: head normocephalic and atraumatic.   Neck: supple with no carotid or supraclavicular bruits Cardiovascular: regular rate and rhythm, no murmurs Musculoskeletal: no deformity Skin:  no rash/petichiae Vascular:  Normal pulses all extremities  Neurologic Exam Mental Status: Awake and fully alert. Oriented to place and time. Recent and remote memory intact. Attention span, concentration and fund of knowledge appropriate. Mood and affect appropriate.  Cranial Nerves: Fundoscopic exam not done. Pupils equal, briskly reactive to light. Extraocular movements full without nystagmus.  Visual fields full to confrontation. Hearing intact. Facial sensation intact. Face, tongue, palate moves normally and symmetrically.  Motor: Normal bulk and tone. Normal strength in all tested extremity muscles. Sensory.: intact to touch , pinprick , position and vibratory sensation.  Coordination: Rapid alternating movements normal in all extremities. Finger-to-nose and heel-to-shin performed accurately bilaterally. Gait and Station: Arises from chair without difficulty. Stance is normal. Gait demonstrates normal stride length and balance . Able to heel, toe and tandem walk without difficulty.  Reflexes: 1+ and symmetric. Toes downgoing.      ASSESSMENT: 35 year old Caucasian lady with left frontal small embolic infarcts of cryptogenic etiology in July 2021 while presenting with episodes of classical migraines.  Negative work-up for vasculitis, hypercoagulable states.  Finding of atrial septal defect ostium secundum type which has been since endovascularly closed.  No significant vascular risk factors     PLAN: I had a long d/w patient about her remote cryptogenic stroke, and migraines, risk for recurrent stroke/TIAs, personally independently reviewed imaging studies and stroke evaluation results and answered questions.Continue aspirin 81 mg daily  for secondary stroke prevention and maintain strict control of hypertension with blood pressure goal below 130/90, diabetes with hemoglobin A1c goal below 6.5% and lipids with LDL cholesterol goal below 70 mg/dL. I also advised the patient to eat a healthy diet with plenty of whole grains, cereals, fruits and vegetables, exercise regularly and maintain ideal body weight.  I advised her to avoid migraine triggers and her migraines do not appear to be frequent enough to justify prophylaxis at the present time and hopefully will get less frequent following her ASD closure.  Followup in the future with me only schedule appointment was made as needed and no  Greater than 50% time during this 25-minute consultation visit was spent in counseling and coordination of care about her cryptogenic strokes and classical migraines and possible paradoxical embolism and answering questions followup in the future with me in 6 months or call earlier if needed. Marissa Contras, MD Note: This document was prepared with digital dictation and possible smart phrase technology. Any transcriptional errors that result from this process are unintentional.

## 2021-04-05 NOTE — Patient Instructions (Signed)
I had a long d/w patient about her remote cryptogenic stroke, and migraines, risk for recurrent stroke/TIAs, personally independently reviewed imaging studies and stroke evaluation results and answered questions.Continue aspirin 81 mg daily  for secondary stroke prevention and maintain strict control of hypertension with blood pressure goal below 130/90, diabetes with hemoglobin A1c goal below 6.5% and lipids with LDL cholesterol goal below 70 mg/dL. I also advised the patient to eat a healthy diet with plenty of whole grains, cereals, fruits and vegetables, exercise regularly and maintain ideal body weight.  I advised her to avoid migraine triggers and her migraines do not appear to be frequent enough to justify prophylaxis at the present time and hopefully will get less frequent following her ASD closure.  Followup in the future with me only schedule appointment was made as needed and no

## 2021-04-19 ENCOUNTER — Ambulatory Visit: Payer: No Typology Code available for payment source | Admitting: Neurology

## 2021-04-24 ENCOUNTER — Other Ambulatory Visit (HOSPITAL_COMMUNITY): Payer: Self-pay

## 2021-04-24 MED FILL — Aspirin Tab Delayed Release 81 MG: ORAL | 30 days supply | Qty: 30 | Fill #0 | Status: AC

## 2021-04-26 ENCOUNTER — Encounter: Payer: No Typology Code available for payment source | Admitting: Family Medicine

## 2021-05-03 ENCOUNTER — Encounter: Payer: No Typology Code available for payment source | Admitting: Family Medicine

## 2021-05-06 ENCOUNTER — Encounter: Payer: Self-pay | Admitting: Family Medicine

## 2021-05-07 ENCOUNTER — Other Ambulatory Visit (HOSPITAL_COMMUNITY): Payer: Self-pay

## 2021-05-07 MED ORDER — PANTOPRAZOLE SODIUM 20 MG PO TBEC
20.0000 mg | DELAYED_RELEASE_TABLET | ORAL | 0 refills | Status: DC
  Filled 2021-05-07: qty 90, 90d supply, fill #0
  Filled 2021-08-09: qty 90, 90d supply, fill #1

## 2021-05-09 ENCOUNTER — Other Ambulatory Visit (HOSPITAL_COMMUNITY): Payer: Self-pay

## 2021-05-11 ENCOUNTER — Ambulatory Visit (INDEPENDENT_AMBULATORY_CARE_PROVIDER_SITE_OTHER): Payer: No Typology Code available for payment source | Admitting: Family Medicine

## 2021-05-11 ENCOUNTER — Other Ambulatory Visit: Payer: Self-pay

## 2021-05-11 VITALS — BP 125/80 | HR 101 | Temp 98.0°F | Ht 64.0 in | Wt 167.0 lb

## 2021-05-11 DIAGNOSIS — G43809 Other migraine, not intractable, without status migrainosus: Secondary | ICD-10-CM

## 2021-05-11 DIAGNOSIS — Z0000 Encounter for general adult medical examination without abnormal findings: Secondary | ICD-10-CM

## 2021-05-11 LAB — BASIC METABOLIC PANEL
BUN/Creatinine Ratio: 16 (ref 9–23)
BUN: 12 mg/dL (ref 6–20)
CO2: 22 mmol/L (ref 20–29)
Calcium: 9.4 mg/dL (ref 8.7–10.2)
Chloride: 103 mmol/L (ref 96–106)
Creatinine, Ser: 0.76 mg/dL (ref 0.57–1.00)
Glucose: 92 mg/dL (ref 65–99)
Potassium: 4.3 mmol/L (ref 3.5–5.2)
Sodium: 140 mmol/L (ref 134–144)
eGFR: 105 mL/min/{1.73_m2} (ref >59)

## 2021-05-11 LAB — LIPID PANEL
Chol/HDL Ratio: 2.3 ratio (ref 0.0–4.4)
Cholesterol, Total: 164 mg/dL (ref 100–199)
HDL: 71 mg/dL (ref >39)
LDL Chol Calc (NIH): 80 mg/dL (ref 0–99)
Triglycerides: 66 mg/dL (ref 0–149)
VLDL Cholesterol Cal: 13 mg/dL (ref 5–40)

## 2021-05-11 LAB — VITAMIN D 25 HYDROXY (VIT D DEFICIENCY, FRACTURES): Vit D, 25-Hydroxy: 62.8 ng/mL (ref 30.0–100.0)

## 2021-05-11 NOTE — Patient Instructions (Addendum)
Med to treat migraine  Nurtec  Med to prevent migraines  Topamax  Ajovy  I will check with neurology regarding your medication  Also remember to repeat cholesterol profile in November Send [place] a message and we will order it

## 2021-05-11 NOTE — Progress Notes (Signed)
   Subjective:    Patient ID: ONEDA DUFFETT, female    DOB: 07-06-1986, 35 y.o.   MRN: [id number]  HPI The patient comes in today for a wellness visit.  Very nice patient Comes in today for a wellness visit Does keep up with her female health checkups Patient did have a patent foraminal ovale is closed.  Doing well now Does have frequent migraines approximately 1/month Not a good candidate for sumatriptan hands because of her neurologic history Does not need to be on daily medicines at this point   A review of their health history was completed.  A review of medications was also completed.  Any needed refills; none  Eating habits: trying to watch it and eat healthy  Falls/  MVA accidents in past few months: none  Regular exercise: work out 3 to 4 times a week   Specialist pt sees on regular basis: cardiology, neurology, Gi  Preventative health issues were discussed.   Additional concerns: migraines more frequent   Review of Systems     Objective:   Physical Exam Eardrums normal throat is normal neck no masses lungs are clear heart is regular no murmurs abdomen soft no masses extremities no edema       Assessment & Plan:  Adult wellness-complete.wellness physical was conducted today. Importance of diet and exercise were discussed in detail.  In addition to this a discussion regarding safety was also covered. We also reviewed over immunizations and gave recommendations regarding current immunization needed for age.  In addition to this additional areas were also touched on including: Preventative health exams needed:  Colonoscopy not indicated currently  Patient was advised yearly wellness exam Patient working hard at healthy eating regular activity and exercise tries to keep her weight down Mental health is doing well Occasional anxiety uses Xanax denies any drowsiness with the medicine Takes her acid reflux pill on a regular basis Takes allergy medicine as  needed  Migraines with aura recommend Nurtec but will get the opinion of her neurologist regarding this  Patient to follow-up with [place] in 1 year  Work hard on cholesterol check lipid profile 6 months

## 2021-05-17 NOTE — Telephone Encounter (Signed)
I do not think Plavix has been shown to be effective in preventing migraines but if you feel Plavix works better for you he can stop aspirin and take Plavix instead

## 2021-05-21 MED FILL — Aspirin Tab Delayed Release 81 MG: ORAL | 30 days supply | Qty: 30 | Fill #1 | Status: AC

## 2021-05-22 ENCOUNTER — Other Ambulatory Visit (HOSPITAL_COMMUNITY): Payer: Self-pay

## 2021-06-02 ENCOUNTER — Telehealth: Payer: No Typology Code available for payment source | Admitting: Nurse Practitioner

## 2021-06-02 DIAGNOSIS — R112 Nausea with vomiting, unspecified: Secondary | ICD-10-CM

## 2021-06-02 MED ORDER — ONDANSETRON HCL 4 MG PO TABS: 4.0000 mg | ORAL_TABLET | Freq: Three times a day (TID) | ORAL | 0 refills | Status: DC | PRN

## 2021-06-02 NOTE — Progress Notes (Signed)
We are sorry that you are not feeling well. Here is how we plan to help!  Based on what you have shared with me it looks like you have a Virus that is irritating your GI tract.  Vomiting is the forceful emptying of a portion of the stomach's content through the mouth.  Although nausea and vomiting can make you feel miserable, it's important to remember that these are not diseases, but rather symptoms of an underlying illness.  When we treat short term symptoms, we always caution that any symptoms that persist should be fully evaluated in a medical office.  I have prescribed a medication that will help alleviate your symptoms and allow you to stay hydrated:  Zofran 4 mg 1 tablet every 8 hours as needed for nausea and vomiting  HOME CARE:  Drink clear liquids.  This is very important! Dehydration (the lack of fluid) can lead to a serious complication.  Start off with 1 tablespoon every 5 minutes for 8 hours.  You may begin eating bland foods after 8 hours without vomiting.  Start with saltine crackers, white bread, rice, mashed potatoes, applesauce.  After 48 hours on a bland diet, you may resume a normal diet.  Try to go to sleep.  Sleep often empties the stomach and relieves the need to vomit.  GET HELP RIGHT AWAY IF:   Your symptoms do not improve or worsen within 2 days after treatment.  You have a fever for over 3 days.  You cannot keep down fluids after trying the medication.  MAKE SURE YOU:   Understand these instructions.  Will watch your condition.  Will get help right away if you are not doing well or get worse.   Thank you for choosing an e-visit. Your e-visit answers were reviewed by a board certified advanced clinical practitioner to complete your personal care plan. Depending upon the condition, your plan could have included both over the counter or prescription medications. Please review your pharmacy choice. Be sure that the pharmacy you have chosen is open so  that you can pick up your prescription now.  If there is a problem you may message your provider in [place] to have the prescription routed to another pharmacy. Your safety is important to [place]. If you have drug allergies check your prescription carefully.  For the next 24 hours, you can use MyChart to ask questions about today's visit, request a non-urgent call back, or ask for a work or school excuse from your e-visit provider. You will get an e-mail in the next two days asking about your experience. I hope that your e-visit has been valuable and will speed your recovery.  5-10 minutes spent reviewing and documenting in chart.

## 2021-06-11 ENCOUNTER — Other Ambulatory Visit: Payer: Self-pay | Admitting: Family Medicine

## 2021-06-11 ENCOUNTER — Encounter: Payer: Self-pay | Admitting: Family Medicine

## 2021-06-11 DIAGNOSIS — L230 Allergic contact dermatitis due to metals: Secondary | ICD-10-CM

## 2021-06-11 NOTE — Addendum Note (Signed)
Addended by: Vicente Males on: 06/11/2021 05:07 PM   Modules accepted: Orders

## 2021-06-11 NOTE — Telephone Encounter (Signed)
Nurses-please put in for her to have dermatology consultation with [facility] dermatology

## 2021-06-15 ENCOUNTER — Ambulatory Visit: Payer: No Typology Code available for payment source | Admitting: Internal Medicine

## 2021-06-24 ENCOUNTER — Encounter: Payer: Self-pay | Admitting: Family Medicine

## 2021-06-25 ENCOUNTER — Other Ambulatory Visit (HOSPITAL_COMMUNITY): Payer: Self-pay

## 2021-06-25 MED FILL — Aspirin Tab Delayed Release 81 MG: ORAL | 30 days supply | Qty: 30 | Fill #2 | Status: AC

## 2021-06-26 ENCOUNTER — Other Ambulatory Visit (HOSPITAL_COMMUNITY): Payer: Self-pay

## 2021-06-26 ENCOUNTER — Other Ambulatory Visit: Payer: Self-pay | Admitting: *Deleted

## 2021-06-26 MED ORDER — NURTEC 75 MG PO TBDP
75.0000 mg | ORAL_TABLET | Freq: Every day | ORAL | 4 refills | Status: AC
  Filled 2021-06-26 (×2): qty 8, 30d supply, fill #0

## 2021-06-26 NOTE — Telephone Encounter (Signed)
Nurses  Please send in Nurtek 75 mg 1 taken at the first sign of a migraine no more than 1 per 24 hours #8 with 4 refills  Marissa Burnett is aware that we are sending in this prescription-please send her a MyChart message letting her know this has been sent in (Should be noted that the patient cannot take sumatriptan hands because of her neurologic history)

## 2021-06-26 NOTE — Telephone Encounter (Signed)
Hi Maddelyn,  I just sent the med to [facility]. Have a good day!

## 2021-06-27 ENCOUNTER — Other Ambulatory Visit (HOSPITAL_COMMUNITY): Payer: Self-pay

## 2021-07-15 ENCOUNTER — Telehealth: Payer: No Typology Code available for payment source | Admitting: Orthopedic Surgery

## 2021-07-15 DIAGNOSIS — K130 Diseases of lips: Secondary | ICD-10-CM | POA: Diagnosis not present

## 2021-07-15 MED ORDER — HYDROCORTISONE 0.5 % EX CREA: 1.0000 "application " | TOPICAL_CREAM | Freq: Four times a day (QID) | CUTANEOUS | 0 refills | Status: DC

## 2021-07-15 NOTE — Progress Notes (Signed)
Virtual Visit Consent   Marissa Burnett, you are scheduled for a virtual visit with a [place] provider today.     Just as with appointments in the office, your consent must be obtained to participate.  Your consent will be active for this visit and any virtual visit you may have with one of our providers in the next 365 days.     If you have a MyChart account, a copy of this consent can be sent to you electronically.  All virtual visits are billed to your insurance company just like a traditional visit in the office.    As this is a virtual visit, video technology does not allow for your provider to perform a traditional examination.  This may limit your provider's ability to fully assess your condition.  If your provider identifies any concerns that need to be evaluated in person or the need to arrange testing (such as labs, EKG, etc.), we will make arrangements to do so.     Although advances in technology are sophisticated, we cannot ensure that it will always work on either your end or our end.  If the connection with a video visit is poor, the visit may have to be switched to a telephone visit.  With either a video or telephone visit, we are not always able to ensure that we have a secure connection.     I need to obtain your verbal consent now.   Are you willing to proceed with your visit today? Yes   Marissa Burnett has provided verbal consent on 07/15/2021 for a virtual visit (video or telephone).   Lisette Abu, PA-C   Date: 07/15/2021 10:47 AM   Virtual Visit via Video Note   I, Lisette Abu, connected with  Marissa Burnett  ([id number], Jun 05, 1986) on 07/15/21 at 10:45 AM EDT by a video-enabled telemedicine application and verified that I am speaking with the correct person using two identifiers.  Location: Patient: Virtual Visit Location Patient: Other: Work Provider: Ecologist: Home Office   I discussed the limitations of evaluation and  management by telemedicine and the availability of in person appointments. The patient expressed understanding and agreed to proceed.    History of Present Illness: Marissa Burnett is a 35 y.o. who identifies as a female who was assigned female at birth, and is being seen today for excessive lip dryness. She feels it stemmed from a lipstick she used first about 6 weeks ago and then again 2 weeks ago. She denies previous issues with this. She has been using chapstick, etc without relief. She has an appointment with dermatology but can't get in until Sept. She denies lesions or mouth issues.  HPI: HPI  Problems:  Patient Active Problem List   Diagnosis Date Noted   COVID-19 virus infection 12/28/2020   Strep pharyngitis 11/02/2020   Otitis media 10/20/2020   Palpitations 09/26/2020   Dizziness 09/26/2020   Premature atrial contractions 09/25/2020   Hypokalemia 09/25/2020   Atrial septal defect 08/30/2020   Contact dermatitis due to metals 08/23/2020   CVA (cerebral vascular accident) ([place]) 07/27/2020   Abnormal MRI 07/27/2020   GERD (gastroesophageal reflux disease) 07/27/2020   Migraine 07/27/2020   Insomnia 04/30/2020   Gastroesophageal reflux disease with esophagitis 07/01/2019   Chronic lumbar pain 09/27/2018   Somatic dysfunction of left sacroiliac joint 02/09/2018   CIN III (cervical intraepithelial neoplasia grade III) with severe dysplasia 11/18/2016   Abnormal Papanicolaou smear of  cervix 10/15/2016   Urticaria 03/16/2016   Anxiety 06/30/2015   ADD (attention deficit disorder) 07/20/2014    Allergies:  Allergies  Allergen Reactions   Peanut-Containing Drug Products Anaphylaxis    THROAT GETS TIGHT, peanut butter   Medications:  Current Outpatient Medications:    acetaminophen (TYLENOL) 500 MG tablet, Take 1,000 mg by mouth daily as needed for headache. , Disp: , Rfl:    ALPRAZolam (XANAX) 0.5 MG tablet, TAKE 1 TABLET(0.5 MG) BY MOUTH TWICE DAILY AS NEEDED FOR ANXIETY,  Disp: 30 tablet, Rfl: 2   aspirin 81 MG EC tablet, TAKE 1 TABLET (81 MG TOTAL) BY MOUTH DAILY. SWALLOW WHOLE., Disp: 30 tablet, Rfl: 11   BIOTIN PO, Take 1 tablet by mouth daily. , Disp: , Rfl:    EPINEPHrine 0.3 mg/0.3 mL IJ SOAJ injection, Inject 0.3 mLs (0.3 mg total) into the muscle once as needed for up to 1 dose for anaphylaxis., Disp: 2 each, Rfl: 1   loratadine (CLARITIN) 10 MG tablet, Take 10 mg by mouth daily., Disp: , Rfl:    ondansetron (ZOFRAN) 4 MG tablet, Take 1 tablet (4 mg total) by mouth every 8 (eight) hours as needed for nausea or vomiting., Disp: 20 tablet, Rfl: 0   pantoprazole (PROTONIX) 20 MG tablet, Take 1 tablet (20 mg total) by mouth daily before breakfast, Disp: 330 tablet, Rfl: 0   Polyvinyl Alcohol-Povidone (REFRESH OP), Place 1 drop into both eyes daily as needed (dry eyes)., Disp: , Rfl:    Rimegepant Sulfate (NURTEC) 75 MG TBDP, Take 1 tablet (75 mg) by mouth at first sign of migraine. Do not take more than 1 every 24 hours., Disp: 8 tablet, Rfl: 4 No current facility-administered medications for this visit.  Facility-Administered Medications Ordered in Other Visits:    sodium chloride flush (NS) 0.9 % injection 10 mL, 10 mL, Intravenous, PRN, Sherren Mocha, MD  Observations/Objective: Patient is well-developed, well-nourished in no acute distress.  Resting comfortably  at work  Head is normocephalic, atraumatic.  No labored breathing.  Speech is clear and coherent with logical content.  Patient is alert and oriented at baseline.    Assessment and Plan: 1. Lip dryness -- Will try hydrocortisone cream qid x7d. If no benefit or worsening then stop. Keep appointment with dermatology.   Follow Up Instructions: I discussed the assessment and treatment plan with the patient. The patient was provided an opportunity to ask questions and all were answered. The patient agreed with the plan and demonstrated an understanding of the instructions.  A copy of  instructions were sent to the patient via MyChart.  The patient was advised to call back or seek an in-person evaluation if the symptoms worsen or if the condition fails to improve as anticipated.  Time:  I spent 15 minutes with the patient via telehealth technology discussing the above problems/concerns.    Lisette Abu, PA-C

## 2021-07-16 ENCOUNTER — Ambulatory Visit (HOSPITAL_COMMUNITY): Payer: Self-pay

## 2021-07-19 ENCOUNTER — Other Ambulatory Visit (HOSPITAL_COMMUNITY): Payer: Self-pay

## 2021-07-19 ENCOUNTER — Encounter: Payer: Self-pay | Admitting: Physician Assistant

## 2021-07-19 ENCOUNTER — Other Ambulatory Visit: Payer: Self-pay

## 2021-07-19 ENCOUNTER — Ambulatory Visit (INDEPENDENT_AMBULATORY_CARE_PROVIDER_SITE_OTHER): Payer: No Typology Code available for payment source | Admitting: Physician Assistant

## 2021-07-19 DIAGNOSIS — L710 Perioral dermatitis: Secondary | ICD-10-CM

## 2021-07-19 MED ORDER — MINOCYCLINE HCL 100 MG PO CAPS
100.0000 mg | ORAL_CAPSULE | Freq: Two times a day (BID) | ORAL | 3 refills | Status: AC
  Filled 2021-07-19: qty 60, 30d supply, fill #0

## 2021-07-19 MED ORDER — TACROLIMUS 0.1 % EX OINT
TOPICAL_OINTMENT | Freq: Every day | CUTANEOUS | 3 refills | Status: DC
  Filled 2021-07-19: qty 90, 30d supply, fill #0
  Filled 2021-08-13: qty 30, 30d supply, fill #0

## 2021-07-20 ENCOUNTER — Encounter: Payer: Self-pay | Admitting: Physician Assistant

## 2021-07-20 NOTE — Progress Notes (Signed)
   New Patient   Subjective  Marissa Burnett is a 35 y.o. female who presents for the following: New Patient (Initial Visit) (Patient here today for lesion above her top lip. Per patient around her lips and her lips feel dry, per patient she did use a new lip liner a few weeks ago and she noticed the dryness of her lips when she used it. Per patient she's no longer using the lip liner. Per patient she did an E-visit on Sunday and was prescribed Hydrocortisone with Aloe patient started using Monday. Patient also has a lesion on her left inner knee x few months that turns red when she gets in the shower, no itching, no pain. ).   The following portions of the chart were reviewed this encounter and updated as appropriate:  Tobacco  Allergies  Meds  Problems  Med Hx  Surg Hx  Fam Hx      Objective  Well appearing patient in no apparent distress; mood and affect are within normal limits.  A focused examination was performed including face and legs. Relevant physical exam findings are noted in the Assessment and Plan.  Left Lower Vermilion Lip, Left Upper Vermilion Lip, Mid Lower Vermilion Lip, Mid Upper Vermilion Lip, Right Lower Vermilion Lip, Right Upper Vermilion Lip Xerosis on lips >perioral. Small papules.  Assessment & Plan  Perioral dermatitis Left Upper Vermilion Lip; Right Upper Vermilion Lip; Left Lower Vermilion Lip; Right Lower Vermilion Lip; Mid Upper Vermilion Lip; Mid Lower Vermilion Lip  tacrolimus (PROTOPIC) 0.1 % ointment - Left Lower Vermilion Lip, Left Upper Vermilion Lip, Mid Lower Vermilion Lip, Mid Upper Vermilion Lip, Right Lower Vermilion Lip, Right Upper Vermilion Lip Apply topically at bedtime.  minocycline (MINOCIN) 100 MG capsule - Left Lower Vermilion Lip, Left Upper Vermilion Lip, Mid Lower Vermilion Lip, Mid Upper Vermilion Lip, Right Lower Vermilion Lip, Right Upper Vermilion Lip Take 1 capsule (100 mg total) by mouth 2 (two) times  daily.  Warned patient of side effects of prescribed medications. She can continue the hydrocortisone and aloe.  I, Jessia Kief, PA-C, have reviewed all documentation's for this visit.  The documentation on 07/20/21 for the exam, diagnosis, procedures and orders are all accurate and complete.

## 2021-07-24 ENCOUNTER — Other Ambulatory Visit: Payer: Self-pay | Admitting: Family Medicine

## 2021-07-24 ENCOUNTER — Other Ambulatory Visit (HOSPITAL_COMMUNITY): Payer: Self-pay

## 2021-07-24 DIAGNOSIS — Q211 Atrial septal defect, unspecified: Secondary | ICD-10-CM

## 2021-07-25 ENCOUNTER — Other Ambulatory Visit (HOSPITAL_COMMUNITY): Payer: Self-pay

## 2021-07-26 ENCOUNTER — Other Ambulatory Visit (HOSPITAL_COMMUNITY): Payer: Self-pay

## 2021-07-26 ENCOUNTER — Other Ambulatory Visit: Payer: Self-pay | Admitting: Family Medicine

## 2021-07-26 ENCOUNTER — Ambulatory Visit: Payer: No Typology Code available for payment source | Admitting: Nurse Practitioner

## 2021-07-27 ENCOUNTER — Other Ambulatory Visit (HOSPITAL_COMMUNITY): Payer: Self-pay

## 2021-07-27 MED ORDER — ASPIRIN 81 MG PO TBEC
81.0000 mg | DELAYED_RELEASE_TABLET | Freq: Every day | ORAL | 11 refills | Status: AC
  Filled 2021-07-27: qty 30, 30d supply, fill #0
  Filled 2021-08-29: qty 30, 30d supply, fill #1
  Filled 2021-09-24: qty 30, 30d supply, fill #2

## 2021-07-30 ENCOUNTER — Other Ambulatory Visit (HOSPITAL_COMMUNITY): Payer: Self-pay

## 2021-08-09 ENCOUNTER — Ambulatory Visit: Payer: No Typology Code available for payment source | Admitting: Internal Medicine

## 2021-08-09 ENCOUNTER — Other Ambulatory Visit (HOSPITAL_COMMUNITY): Payer: Self-pay

## 2021-08-10 ENCOUNTER — Other Ambulatory Visit (HOSPITAL_COMMUNITY): Payer: Self-pay

## 2021-08-13 ENCOUNTER — Other Ambulatory Visit (HOSPITAL_COMMUNITY): Payer: Self-pay

## 2021-08-30 ENCOUNTER — Other Ambulatory Visit (HOSPITAL_COMMUNITY): Payer: Self-pay

## 2021-08-30 MED ORDER — QUICKVUE AT-HOME COVID-19 TEST VI KIT
PACK | 0 refills | Status: DC
  Filled 2021-08-30: qty 2, 2d supply, fill #0

## 2021-08-31 ENCOUNTER — Ambulatory Visit (INDEPENDENT_AMBULATORY_CARE_PROVIDER_SITE_OTHER): Payer: No Typology Code available for payment source | Admitting: Family Medicine

## 2021-08-31 ENCOUNTER — Encounter: Payer: Self-pay | Admitting: Family Medicine

## 2021-08-31 ENCOUNTER — Other Ambulatory Visit: Payer: Self-pay

## 2021-08-31 VITALS — BP 115/75 | Temp 97.1°F | Wt 164.8 lb

## 2021-08-31 DIAGNOSIS — L239 Allergic contact dermatitis, unspecified cause: Secondary | ICD-10-CM

## 2021-08-31 DIAGNOSIS — L710 Perioral dermatitis: Secondary | ICD-10-CM

## 2021-08-31 NOTE — Progress Notes (Signed)
   Subjective:    Patient ID: Marissa Burnett, female    DOB: June 19, 1986, 35 y.o.   MRN: [id number]  HPI Pt diagnosed with Perioral dermatitis back in July. Pt having lip dryness and symptoms not really going away and issue not improving. Pt would like referral to allergist to rule out allergy. Pt had seen Dr.Gallagher before. Pt also changed allergy med.   Periorbital dermatitis on previous exam with dermatology they placed her on a topical medicine patient stopped using it She will reinitiate after our discussion Review of Systems     Objective:   Physical Exam Couple erythematous areas around the lower portion of the lip otherwise normal patient showed me multiple other photographs of periorbital dermatitis       Assessment & Plan:  Periorbital dermatitis use the topical that dermatology prescribed  Raises in the question possibility of allergies discussed with patient she would like to see allergist we will go ahead with referral

## 2021-09-13 NOTE — Patient Instructions (Addendum)
Possible contact dermatitis Let's schedule an appointment to a TRUE patch testing. You will need to be off all steroids 4 weeks prior and no steroid creams for 2 weeks prior. Your patch will be placed on a Monday and you will have readings on Wednesday and Friday Continue Protopic as prescribed by dermatology.    Please let us know if this treatment plan is not working well for you. Schedule follow-up appointment for TRUE patch test

## 2021-09-14 ENCOUNTER — Encounter: Payer: Self-pay | Admitting: Family

## 2021-09-14 ENCOUNTER — Ambulatory Visit: Payer: No Typology Code available for payment source | Admitting: Family

## 2021-09-14 ENCOUNTER — Other Ambulatory Visit: Payer: Self-pay

## 2021-09-14 VITALS — BP 126/80 | HR 88 | Temp 98.6°F | Resp 18 | Ht 64.0 in | Wt 164.6 lb

## 2021-09-14 DIAGNOSIS — L230 Allergic contact dermatitis due to metals: Secondary | ICD-10-CM

## 2021-09-14 MED ORDER — EPINEPHRINE 0.3 MG/0.3ML IJ SOAJ: 0.3000 mg | Freq: Once | INTRAMUSCULAR | 1 refills | Status: AC | PRN

## 2021-09-14 NOTE — Progress Notes (Signed)
[place], [place] [address] Dept: [phone]  FOLLOW UP NOTE  Patient ID: Marissa Burnett, female    DOB: July 03, 1986  Age: 35 y.o. MRN: [id number] Date of Office Visit: 09/14/2021  Assessment  Chief Complaint: Eczema  HPI Marissa Burnett is a 35 year old female who presents today for an acute visit of dry lips.  She was last seen on August 25, 2020 by Gareth Morgan, FNP for 96-hour metal patch test reading.  Since her last office visit she had surgery for her atrial septal defect.  She reports that around June she began having dryness of her lips.  She bought a new lip liner and then lipstick the day before and noticed that her lips felt funny.  She stopped using the lipstick and lip liner but she would still have itching, pain, and dryness.  She then thought that tomatoes could be the cause so she tried stopping this and it did not make any difference.  She has seen dermatology and was diagnosed with perioral dermatitis.  She was given minocycline and Protopic to use.  She finished the minocycline and uses Protopic on most nights.  She feels like her lips are a lot better now than what they were.  This morning after eating black pepper her lips felt irritated.  She also mentions that her lips feel worse in the sun.  She reports no new products other than changing her moisturizer to CeraVe as recommended by her dermatologist.  She also mentions that she has had this area on the right side of her upper lip that started out as a "pimple" 3 years ago. She reports that the dermatologist said that this is of no concern.   Drug Allergies:  No Active Allergies  Review of Systems: Review of Systems  Constitutional:  Negative for chills and fever.  HENT:         Denies rhinorrhea, nasal congestion, and postnasal drip.  Eyes:        Denies itchy watery eyes  Respiratory:  Negative for cough, shortness of breath and wheezing.   Cardiovascular:  Positive for palpitations.  Negative for chest pain.       Reports palpitations the past couple weeks. She has not discussed this with her cardiologist Dr. Burt Knack.  Encouraged her to speak with her cardiologist.  Gastrointestinal:        Reports reflux at times.  She is trying to wean off her medications and eat better.  She sees GI.  Genitourinary:  Negative for frequency.  Skin:  Positive for itching. Negative for rash.       Reports itching and pain of her lips.  Neurological:  Positive for headaches.       Reports ocular migraines.    Physical Exam: BP 126/80 (BP Location: Left Arm, Patient Position: Sitting, Cuff Size: Normal)   Pulse 88   Temp 98.6 F (37 C) (Temporal)   Resp 18   Ht '5\' 4"'$  (1.626 m)   Wt 164 lb 9.6 oz (74.7 kg)   SpO2 98%   BMI 28.25 kg/m    Physical Exam Constitutional:      Appearance: Normal appearance.  HENT:     Head: Normocephalic and atraumatic.     Comments: Pharynx normal, eyes normal, ears normal, nose normal    Right Ear: Tympanic membrane, ear canal and external ear normal.     Left Ear: Tympanic membrane, ear canal and external ear normal.     Nose:  Nose normal.     Mouth/Throat:     Mouth: Mucous membranes are moist.     Pharynx: Oropharynx is clear.  Eyes:     Conjunctiva/sclera: Conjunctivae normal.  Cardiovascular:     Rate and Rhythm: Regular rhythm.     Heart sounds: Normal heart sounds.  Pulmonary:     Effort: Pulmonary effort is normal.     Breath sounds: Normal breath sounds.     Comments: Lungs clear to auscultation Musculoskeletal:     Cervical back: Neck supple.  Skin:    General: Skin is warm.     Comments: No erythema, edema, or dry skin noted on lips. Small papule noted above right upper lip.  Neurological:     Mental Status: She is alert and oriented to person, place, and time.  Psychiatric:        Mood and Affect: Mood normal.        Behavior: Behavior normal.        Thought Content: Thought content normal.        Judgment: Judgment  normal.    Diagnostics:  None  Assessment and Plan: 1. Contact dermatitis due to metals, unspecified contact dermatitis type     Meds ordered this encounter  Medications   EPINEPHrine 0.3 mg/0.3 mL IJ SOAJ injection    Sig: Inject 0.3 mg into the muscle once as needed for up to 1 dose for anaphylaxis.    Dispense:  2 each    Refill:  1    Patient Instructions  Possible contact dermatitis Let's schedule an appointment to a TRUE patch testing. You will need to be off all steroids 4 weeks prior and no steroid creams for 2 weeks prior. Your patch will be placed on a Monday and you will have readings on Wednesday and Friday Continue Protopic as prescribed by dermatology.    Please let us know if this treatment plan is not working well for you. Schedule follow-up appointment for TRUE patch test Return in about 2 weeks (around 09/28/2021), or if symptoms worsen or fail to improve, for patch testing.    Thank you for the opportunity to care for this patient.  Please do not hesitate to contact me with questions.  Althea Charon, FNP [facility]

## 2021-09-18 ENCOUNTER — Ambulatory Visit: Payer: No Typology Code available for payment source | Admitting: Physician Assistant

## 2021-09-24 ENCOUNTER — Ambulatory Visit: Payer: No Typology Code available for payment source | Admitting: Family

## 2021-09-24 ENCOUNTER — Other Ambulatory Visit (HOSPITAL_COMMUNITY): Payer: Self-pay

## 2021-09-26 ENCOUNTER — Encounter: Payer: No Typology Code available for payment source | Admitting: Family

## 2021-09-28 ENCOUNTER — Encounter: Payer: No Typology Code available for payment source | Admitting: Allergy & Immunology

## 2021-10-12 ENCOUNTER — Encounter: Payer: Self-pay | Admitting: Family Medicine

## 2021-10-12 ENCOUNTER — Encounter: Payer: Self-pay | Admitting: Internal Medicine

## 2021-10-12 ENCOUNTER — Other Ambulatory Visit: Payer: Self-pay

## 2021-10-12 ENCOUNTER — Other Ambulatory Visit (HOSPITAL_COMMUNITY): Payer: Self-pay

## 2021-10-12 ENCOUNTER — Ambulatory Visit (INDEPENDENT_AMBULATORY_CARE_PROVIDER_SITE_OTHER): Payer: No Typology Code available for payment source | Admitting: Family Medicine

## 2021-10-12 ENCOUNTER — Ambulatory Visit (INDEPENDENT_AMBULATORY_CARE_PROVIDER_SITE_OTHER): Payer: No Typology Code available for payment source | Admitting: Internal Medicine

## 2021-10-12 VITALS — BP 122/62 | Temp 97.3°F | Wt 161.4 lb

## 2021-10-12 VITALS — BP 130/72 | HR 66 | Resp 98 | Ht 64.0 in | Wt 161.2 lb

## 2021-10-12 DIAGNOSIS — L659 Nonscarring hair loss, unspecified: Secondary | ICD-10-CM | POA: Diagnosis not present

## 2021-10-12 DIAGNOSIS — K219 Gastro-esophageal reflux disease without esophagitis: Secondary | ICD-10-CM | POA: Diagnosis not present

## 2021-10-12 DIAGNOSIS — R202 Paresthesia of skin: Secondary | ICD-10-CM | POA: Diagnosis not present

## 2021-10-12 DIAGNOSIS — R200 Anesthesia of skin: Secondary | ICD-10-CM

## 2021-10-12 MED ORDER — TRIAMCINOLONE ACETONIDE 0.1 % EX CREA: TOPICAL_CREAM | CUTANEOUS | Status: DC

## 2021-10-12 MED ORDER — FAMOTIDINE 20 MG PO TABS
20.0000 mg | ORAL_TABLET | Freq: Two times a day (BID) | ORAL | 3 refills | Status: AC | PRN
  Filled 2021-10-12: qty 90, 45d supply, fill #0
  Filled 2022-01-08: qty 90, 45d supply, fill #1

## 2021-10-12 NOTE — Progress Notes (Signed)
Marissa Burnett 35 y.o. 1986-06-12 [id number]  Assessment & Plan:   Encounter Diagnosis  Name Primary?   Gastroesophageal reflux disease, unspecified whether esophagitis present Yes    We talked about the pros and cons of tapering, she did get down to the 20 mg dose of pantoprazole without problems.  I recommend that she try to stop it and use Pepcid as needed without a taper.  She will let me know if that does not work out.  If she has recurrent problems we may need to explore regular PPI therapy again.  She will continue to work on lifestyle modification and see me as needed.  Medications Discontinued During This Encounter  Medication Reason   hydrocortisone cream 0.5 % Patient Preference   Hydrocortisone-Aloe 0.5 % CREA Patient Preference   ondansetron (ZOFRAN) 4 MG tablet Patient Preference   pantoprazole (PROTONIX) 20 MG tablet    Meds ordered this encounter  Medications   famotidine (PEPCID) 20 MG tablet    Sig: Take 1 tablet (20 mg total) by mouth 2 (two) times daily as needed for heartburn or indigestion.    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  She is interested in trying to stop PPI.  She was last seen in late March of this year.  The plan then was actually to taper her PPI but she never went below the 20 mg dose.  Took a trip not too long ago when she was in the [place] and she was eating differently and drinking a bit more than usual and had some epigastric pain so she is got little concerned about that about whether she can taper or not and is here to discuss.  She is not having any dysphagia.  Doing well otherwise.  She has been working on trying to change eating and lose weight as well. Wt Readings from Last 3 Encounters:  10/12/21 161 lb 4 oz (73.1 kg)  10/12/21 161 lb 6.4 oz (73.2 kg)  09/14/21 164 lb 9.6 oz  (74.7 kg)     No Known Allergies Current Meds  Medication Sig   acetaminophen (TYLENOL) 500 MG tablet Take 1,000 mg by mouth daily as needed for headache.    ALPRAZolam (XANAX) 0.5 MG tablet TAKE 1 TABLET(0.5 MG) BY MOUTH TWICE DAILY AS NEEDED FOR ANXIETY   aspirin (ASPIRIN LOW DOSE) 81 MG EC tablet TAKE 1 TABLET (81 MG TOTAL) BY MOUTH DAILY. SWALLOW WHOLE.   BIOTIN PO Take 1 tablet by mouth daily.    COVID-19 At Home Antigen Test (QUICKVUE AT-HOME COVID-19 TEST) KIT USE AS DIRECTED WITHIN PACKAGE INSTRUCTIONS.   EPINEPHrine 0.3 mg/0.3 mL IJ SOAJ injection Inject 0.3 mg into the muscle once as needed for up to 1 dose for anaphylaxis.   loratadine (CLARITIN) 10 MG tablet Take 10 mg by mouth daily.   pantoprazole (PROTONIX) 20 MG tablet Take 1 tablet (20 mg total) by mouth daily before breakfast   Polyvinyl Alcohol-Povidone (REFRESH OP) Place 1 drop into both eyes daily as needed (dry eyes).   Rimegepant Sulfate (NURTEC) 75 MG TBDP Take 1 tablet (75 mg) by mouth at first sign of migraine. Do not take more than 1 every 24 hours.   tacrolimus (PROTOPIC) 0.1 % ointment Apply topically at bedtime.   Past Medical History:  Diagnosis Date  Abnormal Papanicolaou smear of cervix 10/15/2016   LSIL+HPV   Allergy    Anxiety    ASD (atrial septal defect) 07/28/2020   Chronic lumbar pain 09/27/2018   Patient has seen Dr. Arnoldo Morale neurosurgery September 2019-they do not feel there is anything surgical that can help-they are recommending injections with pain medicine specialist   COVID    Gallstones    GERD (gastroesophageal reflux disease)    Heart murmur birth   Migraine    Stroke ([facility]) 07/12/2020   a. L MCA stroke in 06/2020.   TIA (transient ischemic attack)    Past Surgical History:  Procedure Laterality Date   ABDOMINOPLASTY     ATRIAL SEPTAL DEFECT(ASD) CLOSURE N/A 08/30/2020   Procedure: ATRIAL SEPTAL DEFECT (ASD) CLOSURE;  Surgeon: Sherren Mocha, MD;  Location: [facility];   Service: Cardiovascular;  Laterality: N/A;   BREAST ENHANCEMENT SURGERY     CERVICAL CONIZATION W/BX N/A 12/13/2016   Procedure: CONIZATION CERVIX WITH BIOPSY--cold knife conization;  Surgeon: Jonnie Kind, MD;  Location: [facility];  Service: Gynecology;  Laterality: N/A;   CHOLECYSTECTOMY  02/2002   ENDOSCOPIC RETROGRADE CHOLANGIOPANCREATOGRAPHY (ERCP) WITH PROPOFOL     OTHER SURGICAL HISTORY     stent in kidney during pregnancy,2002; removed in 2003   TEE WITHOUT CARDIOVERSION N/A 08/04/2020   Procedure: TRANSESOPHAGEAL ECHOCARDIOGRAM (TEE);  Surgeon: Larey Dresser, MD;  Location: [facility];  Service: Cardiovascular;  Laterality: N/A;   Social History   Social History Narrative   Married, respiratory therapist [facility]   2 children   Occasional alcohol, never smoker no drug use   Right handed   Drinks 1-2 cups coffee daily   family history includes ADD / ADHD in her daughter; Anxiety disorder in her daughter; COPD in her maternal grandfather; Colon polyps in her paternal grandmother; Diabetes in her maternal grandmother and another family member; Heart disease in her paternal grandmother; Irritable bowel syndrome in her paternal grandmother; Lung cancer in her paternal grandfather.   Review of Systems As above  Objective:   Physical Exam BP 130/72   Pulse 66   Resp (!) 98   Ht _0  (1.626 m)   Wt 161 lb 4 oz (73.1 kg)   BMI 27.68 kg/m

## 2021-10-12 NOTE — Addendum Note (Signed)
Addended by: Vicente Males on: 10/12/2021 02:44 PM   Modules accepted: Orders

## 2021-10-12 NOTE — Progress Notes (Signed)
   Subjective:    Patient ID: Marissa Burnett, female    DOB: [birth year], 35 y.o.   MRN: [id number]  HPI Pt having numbness/tingling in legs and arms. Pt thought it was anxiety due to flying. Patient relates bilateral numbness tingling that comes and goes been relatively persistent over the past week started a couple weeks ago Also has noticed losing more hair and states the ends of her hair seem to be breaking off denies any major setbacks with that She was concerned about electrolyte imbalance versus other issues The likelihood of having a stroke is low but she has had a stroke in the past which was due to patent foramen ovale which has been closed up Pt having hair loss also; increased PVC.  Had Covid in July Pt was bit by mosquitos in [place] last week. Pt ate banana every day up until the last few days   Review of Systems     Objective:   Physical Exam  General-in no acute distress Eyes-no discharge Lungs-respiratory rate normal, CTA CV-no murmurs,RRR Extremities skin warm dry no edema Neuro grossly normal Behavior normal, alert  Reflexes are good     Assessment & Plan:   Numbness tingling Hair loss Labs ordered Await results Hold off on MRI currently Doubt stroke related symptoms Do not feel that this is a mosquito related illness

## 2021-10-12 NOTE — Telephone Encounter (Signed)
Nurses Please send to the pharmacy of Marissa Burnett's choice I recommend triamcinolone cream 0.1% apply thin amount twice daily over the course of the next 5 to 7 days, 30 g with 2 refills  Essentially this will help with itching.  Less itching-these bug bites will heal up quicker. Do not use cream on the facial area  Thanks-Dr. Nicki Reaper

## 2021-10-12 NOTE — Patient Instructions (Addendum)
If you are age 36 or younger, your body mass index should be between 19-25. Your Body mass index is 27.68 kg/m. If this is out of the aformentioned range listed, please consider follow up with your Primary Care Provider.  __________________________________________________________  The [facility] providers would like to encourage you to use [facility] to communicate with providers for non-urgent requests or questions.  Due to long hold times on the telephone, sending your provider a message by [facility] may be a faster and more efficient way to get a response.  Please allow 48 business hours for a response.  Please remember that this is for non-urgent requests.   We have sent the following medications to your pharmacy for you to pick up at your convenience:  START: Pecid 20 mg one tablet as needed.  DISCONTINUE: Protonix  You will follow up with our office on an as needed basis.  I appreciate the opportunity to care for you. Silvano Rusk, MD, [facility]

## 2021-10-13 LAB — BASIC METABOLIC PANEL
BUN/Creatinine Ratio: 23 (ref 9–23)
BUN: 15 mg/dL (ref 6–20)
CO2: 21 mmol/L (ref 20–29)
Calcium: 9.2 mg/dL (ref 8.7–10.2)
Chloride: 103 mmol/L (ref 96–106)
Creatinine, Ser: 0.66 mg/dL (ref 0.57–1.00)
Glucose: 88 mg/dL (ref 70–99)
Potassium: 4.3 mmol/L (ref 3.5–5.2)
Sodium: 139 mmol/L (ref 134–144)
eGFR: 117 mL/min/{1.73_m2} (ref >59)

## 2021-10-13 LAB — T3: T3, Total: 138 ng/dL (ref 71–180)

## 2021-10-13 LAB — T4, FREE: Free T4: 1.37 ng/dL (ref 0.82–1.77)

## 2021-10-13 LAB — VITAMIN B12: Vitamin B-12: 781 pg/mL (ref 232–1245)

## 2021-10-13 LAB — MAGNESIUM: Magnesium: 2 mg/dL (ref 1.6–2.3)

## 2021-10-13 LAB — TSH: TSH: 1.14 u[IU]/mL (ref 0.450–4.500)

## 2021-10-22 ENCOUNTER — Telehealth: Payer: Self-pay | Admitting: Cardiovascular Disease

## 2021-10-22 NOTE — Telephone Encounter (Signed)
That's fine. thanks

## 2021-10-22 NOTE — Telephone Encounter (Signed)
Spoke with pt who states she is scheduled for eyebrow microblading on 11/04/2021 at [facility]. Pt states in order to have this done she must be off aspirin for 2 days prior to procedure.   Pt advised will forward to Dr Burt Knack for review and recommendation.  Pt verbalizes understanding and agrees with current plan.

## 2021-10-22 NOTE — Telephone Encounter (Signed)
Pt c/o medication issue:  1. Name of Medication:  aspirin (ASPIRIN LOW DOSE) 81 MG EC tablet  2. How are you currently taking this medication (dosage and times per day)?  As prescribed  3. Are you having a reaction (difficulty breathing--STAT)?  No   4. What is your medication issue?   Patient states she is scheduled to have microblading, but she has to come off Aspirin for 2 days. She would like to confirm whether or not this is alright. Please return call to discuss.

## 2021-10-23 ENCOUNTER — Telehealth: Payer: Self-pay | Admitting: Family Medicine

## 2021-10-23 ENCOUNTER — Ambulatory Visit: Payer: No Typology Code available for payment source | Admitting: Family Medicine

## 2021-10-23 NOTE — Telephone Encounter (Signed)
Patient wanted you to know having some issues with Blood pressure again last night its was 140/100 and this morning 140/90. She states took a nerve pill came down to 137 top number. Please advise

## 2021-10-23 NOTE — Telephone Encounter (Signed)
She has some options, I am concerned about her blood pressures. 1 option is to be very mindful of diet, salt intake, fit in exercise in, continue to monitor blood pressure and let us know if not doing better by next week  Option #2-we can do a follow-up visit in person tomorrow afternoon, or Friday before lunch, she can bring her machine with her and I can check on her machine as well as our unit  Please see what she would like to do then lets move forward

## 2021-10-23 NOTE — Telephone Encounter (Signed)
Spoke with pt who reports she has had left sided tingling x 6 weeks as well as some mildly elevated BP the past few days..  BP last night was 140/100 and BP this morning was 147/90.  Pt states she also began having some upper back pain on 10/21/21.  She denies current CP or SOB.  She states she has seen her PCP for the left sided tingling and has contacted his office this morning to discuss these other issues as well.  She continues follow up with her neurologist.  Pt is concerned re: current symptoms.  Pt advised to continue to monitor and log BP about the same time each day.  Will forward concerns for Dr Burt Knack to review as well.  Reviewed ED precautions.  Pt verbalizes understanding and agrees with current plan.

## 2021-10-23 NOTE — Telephone Encounter (Signed)
Attempted phone call to pt.  OK per Epic to leave detailed voicemail message.  Pt advised per Dr Burt Knack it is fine for pt to be off ASA 2 days prior to her microblading procedure.  Please contact [phone] for any further questions or concerns.

## 2021-10-23 NOTE — Telephone Encounter (Signed)
Patient was returning call and calling with another issues with her bp. Please advise

## 2021-10-24 ENCOUNTER — Ambulatory Visit (INDEPENDENT_AMBULATORY_CARE_PROVIDER_SITE_OTHER): Payer: No Typology Code available for payment source | Admitting: Family Medicine

## 2021-10-24 ENCOUNTER — Other Ambulatory Visit: Payer: Self-pay

## 2021-10-24 ENCOUNTER — Encounter: Payer: Self-pay | Admitting: Family Medicine

## 2021-10-24 VITALS — BP 148/82 | HR 96 | Temp 98.6°F | Wt 162.0 lb

## 2021-10-24 DIAGNOSIS — R030 Elevated blood-pressure reading, without diagnosis of hypertension: Secondary | ICD-10-CM

## 2021-10-24 DIAGNOSIS — M546 Pain in thoracic spine: Secondary | ICD-10-CM | POA: Diagnosis not present

## 2021-10-24 NOTE — Progress Notes (Signed)
   Subjective:    Patient ID: Marissa Burnett, female    DOB: [birth year], 35 y.o.   MRN: [id number]  HPI Pt has been having numbness and tingling. Pt checked blood pressure a few days ago and it was 140/100. Pt believes she has anxiety due to having some high blood pressure readings. Took Xanax to help calm down and that bring readings down. Pt had left side tingling when blood pressure is elevated.  This patient has a history of atrial septal defect that has been repaired She also has had a stroke in the past She has been having numbness and tingling off and on for the past several weeks on the left side of her body She has had some superior mid thoracic back pain Denies any chest pressure or tightness She does state her blood pressure has been running high on several occasions She finds her self stressed out about this  Review of Systems     Objective:   Physical Exam  General-in no acute distress Eyes-no discharge Lungs-respiratory rate normal, CTA CV-no murmurs,RRR Extremities skin warm dry no edema Neuro grossly normal Behavior normal, alert  Blood pressure was checked numerous times with her machine and ours her systolic over reads by 5-8 points and diastolic by 5 points blood pressure readings here were acceptable at 134/84     Assessment & Plan:  Currently she is desiring to utilize healthy diet regular activity increasing cardio to help her blood pressure.  We will not start medications currently but will check her back within 4 to 6 weeks  Will check with her cardiologist Dr. Burt Knack if ambulatory blood pressure monitoring is something that they do she might benefit from this  Will also touch base with her neurologist regarding the tingling on the left side of her body in my opinion she will need a MRI of the brain and MRI of the cervical spine I find no evidence of an active stroke currently

## 2021-10-29 ENCOUNTER — Other Ambulatory Visit: Payer: Self-pay

## 2021-10-29 ENCOUNTER — Ambulatory Visit (INDEPENDENT_AMBULATORY_CARE_PROVIDER_SITE_OTHER): Payer: No Typology Code available for payment source | Admitting: Family Medicine

## 2021-10-29 VITALS — BP 132/82 | HR 89 | Temp 95.4°F | Resp 18

## 2021-10-29 DIAGNOSIS — L235 Allergic contact dermatitis due to other chemical products: Secondary | ICD-10-CM

## 2021-10-29 NOTE — Progress Notes (Signed)
Follow-up Note  RE: Marissa Burnett MRN: [id number] DOB: 1986-06-06 Date of Office Visit: 10/29/2021  Primary care provider: [name], MD Referring provider: [name], MD   [name] returns to the office today for the patch test placement, given suspected history of contact dermatitis.    Diagnostics: True Test patches placed in addition to Fenty Beauty lip gloss Fenty Glow color  Ingredients contained in FENTY GLOW: POLYBUTENE, OCTYLDODECANOL, BIS-DIGLYCERYL POLYACYLADIPATE-2, TRICAPRYLIN, CERA MICROCRISTALLINA/MICROCRYSTALLINE WAX/CIRE MICROCRISTALLINE, POLYETHYLENE, SILICA DIMETHYL SILYLATE, VP/EICOSENE COPOLYMER, VP/HEXADECENE COPOLYMER, BUTYROSPERMUM PARKII (SHEA) BUTTER, MICA, SORBITAN OLEATE, SILICA, OCTYLDODECYL NEOPENTANOATE, ETHYLHEXYL PALMITATE, CAPRYLYL GLYCOL, BUTYROSPERMUM PARKII (SHEA) BUTTER UNSAPONIFIABLES, AROMA/FLAVOR, PARFUM/FRAGRANCE, TRIBEHENIN, PENTAERYTHRITYL TETRA-DI-T-BUTYL HYDROXYHYDROCINNAMATE, SORBITAN ISOSTEARATE, TOCOPHEROL, TOCOPHERYL ACETATE, SYNTHETIC FLUORPHLOGOPITE, TETRAHEXYLDECYL ASCORBATE, LACTIC ACID, CALCIUM SODIUM BOROSILICATE, CALCIUM ALUMINUM BOROSILICATE, POLYETHYLENE TEREPHTHALATE, PALMITOYL TRIPEPTIDE-1, ACRYLATES COPOLYMER, TIN OXIDE, TITANIUM DIOXIDE (CI 77891), IRON OXIDES (CI 77491, CI 77499), BENZYL BENZOATE, LIMONENE.   Plan:   Allergic contact dermatitis - Instructions provided on care of the patches for the next 48 hours. [name] was instructed to avoid showering for the next 48 hours. [name] will follow up in 48 hours and 96 hours for patch readings.    Call the clinic if this treatment plan is not working well for you  Follow up in 2 days or sooner if needed.  Thank you for the opportunity to care for this patient.  Please do not hesitate to contact me with questions.  Gareth Morgan, FNP [facility]

## 2021-10-29 NOTE — Patient Instructions (Addendum)
Diagnostics: True Test patches placed in addition to Fenty Beauty lip gloss Fenty Glow color  Allergic contact dermatitis - Instructions provided on care of the patches for the next 48 hours. Marissa Burnett was instructed to avoid showering for the next 48 hours. Marissa Burnett will follow up in 48 hours and 96 hours for patch readings.    Call the clinic if this treatment plan is not working well for you  Follow up in 2 days or sooner if needed.

## 2021-10-30 ENCOUNTER — Encounter: Payer: Self-pay | Admitting: Family Medicine

## 2021-10-30 DIAGNOSIS — R202 Paresthesia of skin: Secondary | ICD-10-CM

## 2021-10-30 DIAGNOSIS — Z8673 Personal history of transient ischemic attack (TIA), and cerebral infarction without residual deficits: Secondary | ICD-10-CM

## 2021-10-30 DIAGNOSIS — R200 Anesthesia of skin: Secondary | ICD-10-CM

## 2021-10-30 NOTE — Telephone Encounter (Signed)
Nurses  Please go ahead with ordering MRI of the brain with and without contrast due to left side numbness tingling and history of a stroke.  You may also pass along the following message to Zackery Barefoot I have not heard back from Dr. Leonie Man yet but I agree with you to go ahead with ordering the MRI.  It would be best to look at the MRI of the brain as well as cervical spine. Typically in these situations we will start off with the MRI of the brain first and if this looks normal then proceed to order a MRI of the cervical spine for completeness.  In other words of the MRI of the brain explains what is going on there is no need for an MRI of the cervical spine.  Whereas if the MRI of the brain does not show evidence that would be triggering your left-sided symptoms and then proceeding forward with setting you up for an MRI of the cervical spine would be the next clinical step.  If you are fine with this please give some feedback that we can move forward with this stepwise process.  As for your blood pressure I am glad to hear that is doing better.  I did communicate with Dr. Burt Knack who stated that they can do a ambulatory blood pressure monitoring cuff that you would wear.  Given that your blood pressure is now doing well I do not necessarily think you have to do that.  If it starts becoming elevated then I believe it would be a good idea to get a feel for how your blood pressure is doing outside of the office.  Feel free to send [place] your feedback thanks-Dr. Nicki Reaper

## 2021-10-31 ENCOUNTER — Telehealth: Payer: Self-pay | Admitting: Neurology

## 2021-10-31 ENCOUNTER — Ambulatory Visit: Payer: No Typology Code available for payment source | Admitting: Family

## 2021-10-31 ENCOUNTER — Other Ambulatory Visit: Payer: Self-pay

## 2021-10-31 ENCOUNTER — Encounter: Payer: Self-pay | Admitting: Family

## 2021-10-31 ENCOUNTER — Encounter: Payer: No Typology Code available for payment source | Admitting: Allergy

## 2021-10-31 DIAGNOSIS — L230 Allergic contact dermatitis due to metals: Secondary | ICD-10-CM

## 2021-10-31 NOTE — Telephone Encounter (Signed)
Pt called states her PCP has ordered an MRI due to a tingling feeling with her migraines pt has been experiencing here lately. Pt is wanting to know does Dr. Leonie Man want to see her once she has the MRI. Pt requesting a call back.

## 2021-10-31 NOTE — Progress Notes (Signed)
Margrete returns to the office today for the  48 hourl patch test interpretation, given suspected history of contact dermatitis.   She reports since her last office visit that she has had off-and-on tingling of bilateral arms and legs. The tingling started in September of this year.  She denies any numbness.  Her primary care physician is going to schedule her for an MRI, but she does not know the date yet.  She has seen neurology in the past but does not have a follow-up appointment with them.  She reports that the tingling is more on the left side.  She also mentions that she has been losing more hair than normal.  She has not noticed any large patches of hair missing.  Instructed her to schedule an appointment with neurology to also discussed the numbness of her arms and legs.  Also instructed her to speak with her primary care physician about her more than normal hair loss.   Diagnostics:   TRUE TEST 48-hour hour reading: negative to all TRUE test  and Fenty Beauty Lip Gloss  Plan:   Allergic contact dermatitis - Keep your follow up appointment this Friday for the 96 hour patch test reading   Althea Charon, [facility] [place] [facility]

## 2021-10-31 NOTE — Telephone Encounter (Signed)
I returned the call to the patient. Reports history of TIA and migraines. Last migraine 05/11/21. Recent return of intermittent, bilateral extremity tingling (worse in left arm). States her PCP is ordering MRI brain and requested her to follow up at our office afterwards (no date for scan yet - waiting on approval). She has been scheduled for an appt at our office. She told me this occurred with Covid during her first infection. She contracted the virus again in July 2022 but the tingling just started several weeks ago so she is unsure that it related this time. This call will be sent to Dr. Leonie Man to keep him informed.

## 2021-11-01 NOTE — Telephone Encounter (Signed)
Scheduled 01/08/2022.

## 2021-11-02 ENCOUNTER — Other Ambulatory Visit: Payer: Self-pay | Admitting: Family Medicine

## 2021-11-02 ENCOUNTER — Encounter: Payer: Self-pay | Admitting: Family

## 2021-11-02 ENCOUNTER — Ambulatory Visit: Payer: No Typology Code available for payment source | Admitting: Family

## 2021-11-02 ENCOUNTER — Other Ambulatory Visit: Payer: Self-pay

## 2021-11-02 DIAGNOSIS — L230 Allergic contact dermatitis due to metals: Secondary | ICD-10-CM

## 2021-11-02 DIAGNOSIS — R200 Anesthesia of skin: Secondary | ICD-10-CM

## 2021-11-02 NOTE — Telephone Encounter (Signed)
Nurses  With MRI there is no radiation exposure, it is all magnetic resonance  Typically in the situations we do stepwise from a systematic approach as well as cost approach.  If Marissa Burnett desires to have both done at the same time we can go ahead and order MRI of the brain and cervical spine because of left side numbness and tingling.  Please be aware of her insurance company requires [place] to do a stepwise that is what we will have to do. May reorder both tests to be done at one time

## 2021-11-02 NOTE — Progress Notes (Signed)
Angla returns to the office today for the final patch test interpretation, given suspected history of contact dermatitis.  She reports on Tuesday of this week her lips started becoming dry again and painful.  She started using the triamcinolone as prescribed by dermatology and this has helped some.  She reports by Sunday her lip should be back to normal.  Upon examination no erythema, edema, or dryness noted of the lips.  She thought that maybe the patch testing could have caused this.  Instructed her that with contact dermatitis the reaction occurs where the area touches the product.   Diagnostics:   TRUE TEST 96-hour hour reading: All TRUE patch test and Fenty Beauty Lip Gloss negative   Plan:   Allergic contact dermatitis - Recommended following up with dermatology due to negative patch test resullts.  Please let us know if this treatment plan is not working well for you. Schedule a follow up appointment in 6 months or sooner if needed  Althea Charon, [facility] [place] [facility]

## 2021-11-04 ENCOUNTER — Encounter: Payer: Self-pay | Admitting: Family Medicine

## 2021-11-07 ENCOUNTER — Encounter: Payer: Self-pay | Admitting: Family Medicine

## 2021-11-13 ENCOUNTER — Ambulatory Visit (HOSPITAL_COMMUNITY): Payer: No Typology Code available for payment source

## 2021-11-21 ENCOUNTER — Other Ambulatory Visit (HOSPITAL_COMMUNITY): Payer: No Typology Code available for payment source

## 2021-11-21 ENCOUNTER — Ambulatory Visit (HOSPITAL_COMMUNITY): Payer: No Typology Code available for payment source

## 2021-11-22 ENCOUNTER — Encounter: Payer: Self-pay | Admitting: Family Medicine

## 2021-11-26 ENCOUNTER — Other Ambulatory Visit: Payer: Self-pay | Admitting: Family Medicine

## 2021-11-26 MED ORDER — LORAZEPAM 1 MG PO TABS: ORAL_TABLET | ORAL | 0 refills | Status: DC

## 2021-11-27 ENCOUNTER — Ambulatory Visit (HOSPITAL_COMMUNITY)
Admission: RE | Admit: 2021-11-27 | Discharge: 2021-11-27 | Disposition: A | Discharge status: Home / Self Care | Payer: No Typology Code available for payment source | Source: Ambulatory Visit | Attending: Family Medicine | Admitting: Family Medicine

## 2021-11-27 ENCOUNTER — Other Ambulatory Visit: Payer: Self-pay

## 2021-11-27 DIAGNOSIS — R200 Anesthesia of skin: Secondary | ICD-10-CM | POA: Insufficient documentation

## 2021-11-27 DIAGNOSIS — R202 Paresthesia of skin: Secondary | ICD-10-CM | POA: Insufficient documentation

## 2021-11-27 DIAGNOSIS — Z8673 Personal history of transient ischemic attack (TIA), and cerebral infarction without residual deficits: Secondary | ICD-10-CM | POA: Diagnosis present

## 2021-11-27 MED ORDER — GADOBUTROL 1 MMOL/ML IV SOLN
10.0000 mL | Freq: Once | INTRAVENOUS | Status: AC | PRN
  Administered 2021-11-27: 10 mL via INTRAVENOUS

## 2021-11-28 ENCOUNTER — Encounter: Payer: Self-pay | Admitting: Family Medicine

## 2021-11-28 ENCOUNTER — Telehealth: Payer: Self-pay | Admitting: Family Medicine

## 2021-11-28 DIAGNOSIS — R200 Anesthesia of skin: Secondary | ICD-10-CM

## 2021-11-28 DIAGNOSIS — G542 Cervical root disorders, not elsewhere classified: Secondary | ICD-10-CM

## 2021-11-28 NOTE — Telephone Encounter (Signed)
Patient would like results of MRI  that she had did yesterday.

## 2021-11-28 NOTE — Telephone Encounter (Signed)
Front-please put her on the schedule for late this morning as a phone visit to discuss the results of her MRI and discuss referral options regarding neurosurgery thank you

## 2021-11-29 ENCOUNTER — Telehealth: Payer: Self-pay | Admitting: Family Medicine

## 2021-11-29 DIAGNOSIS — Z8673 Personal history of transient ischemic attack (TIA), and cerebral infarction without residual deficits: Secondary | ICD-10-CM

## 2021-11-29 DIAGNOSIS — R200 Anesthesia of skin: Secondary | ICD-10-CM

## 2021-11-29 DIAGNOSIS — R202 Paresthesia of skin: Secondary | ICD-10-CM

## 2021-11-29 NOTE — Telephone Encounter (Signed)
Where would  like [place] to add patient to your schedule . Will this be a phone visit. Please advise

## 2021-11-29 NOTE — Telephone Encounter (Signed)
Left message on Brewing technologist. Referral placed

## 2021-11-29 NOTE — Telephone Encounter (Signed)
I spoke with her thanks No need to add

## 2021-11-29 NOTE — Telephone Encounter (Signed)
Nurses I will need to speak with Dr. Gillian Shields It is not emergent but ideally would like to speak with him regarding her MRI cervical spine If she could look at the MRI and talk with me regarding the patient that would be great Patient having some intermittent left arm pain and numbness no weakness  Also go ahead with referral.  Patient would like to be seen specifically by Dr. Arnoldo Morale thank you

## 2021-11-30 NOTE — Telephone Encounter (Signed)
Provider was able to speak with neurosurgery. Please see my chart message

## 2021-11-30 NOTE — Telephone Encounter (Signed)
Please do neuro surgery referral Dr. Arnoldo Morale In [facility] please set this up for late February or March so that she has time to see neurology in January thank you Due to cervical nerve impingement and numbness left arm

## 2021-12-10 ENCOUNTER — Other Ambulatory Visit: Payer: Self-pay | Admitting: Family Medicine

## 2021-12-12 ENCOUNTER — Encounter: Payer: Self-pay | Admitting: Family Medicine

## 2021-12-12 ENCOUNTER — Encounter: Payer: Self-pay | Admitting: Cardiovascular Disease

## 2021-12-12 DIAGNOSIS — R200 Anesthesia of skin: Secondary | ICD-10-CM

## 2021-12-12 DIAGNOSIS — Z8673 Personal history of transient ischemic attack (TIA), and cerebral infarction without residual deficits: Secondary | ICD-10-CM

## 2021-12-12 NOTE — Telephone Encounter (Signed)
Please send referral  Dx numbness, hx stroke

## 2021-12-14 ENCOUNTER — Telehealth: Payer: Self-pay | Admitting: Family Medicine

## 2021-12-14 NOTE — Telephone Encounter (Signed)
PA for Nurtec 75 mg has been approved from 12/14/21-12/13/22. My chart message sent to patient.

## 2021-12-20 DIAGNOSIS — T23292S Burn of second degree of multiple sites of left wrist and hand, sequela: Secondary | ICD-10-CM | POA: Insufficient documentation

## 2022-01-08 ENCOUNTER — Ambulatory Visit: Payer: No Typology Code available for payment source | Admitting: Neurology

## 2022-01-08 ENCOUNTER — Other Ambulatory Visit (HOSPITAL_COMMUNITY): Payer: Self-pay

## 2022-01-08 ENCOUNTER — Encounter: Payer: Self-pay | Admitting: Neurology

## 2022-01-08 VITALS — BP 148/88 | HR 101 | Ht 64.0 in | Wt 161.0 lb

## 2022-01-08 DIAGNOSIS — R202 Paresthesia of skin: Secondary | ICD-10-CM

## 2022-01-08 DIAGNOSIS — Z8673 Personal history of transient ischemic attack (TIA), and cerebral infarction without residual deficits: Secondary | ICD-10-CM | POA: Diagnosis not present

## 2022-01-08 DIAGNOSIS — Q2112 Patent foramen ovale: Secondary | ICD-10-CM | POA: Diagnosis not present
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
## 2022-01-08 NOTE — Patient Instructions (Signed)
I had a long discussion with the patient regarding her new complaints of intermittent paresthesias which are of unclear etiology and recommend further evaluation with checking neuropathy panel labs and EMG nerve conduction study.  The paresthesias are not frequent and severe enough at the present time to justify medications.  Reviewed results of MRI of the cervical spine showing minor disc bulging which is not in my opinion significant enough to justify referral to neurosurgery at this time.  I recommend she do regular neck stretching exercises and may reconsider if symptoms get worse.  Continue aspirin for stroke prevention and maintain aggressive risk factor modification strict control of hypertension with blood pressure goal below 130/90, lipids with LDL cholesterol goal below 70 mg percent and diabetes with hemoglobin A1c goal below 6.5%.  Continue Nurtec 75 mg for symptomatic relief of migraines which appear infrequent.  Return for follow-up in the future only as needed. Neck Exercises Ask your health care provider which exercises are safe for you. Do exercises exactly as told by your health care provider and adjust them as directed. It is normal to feel mild stretching, pulling, tightness, or discomfort as you do these exercises. Stop right away if you feel sudden pain or your pain gets worse. Do not begin these exercises until told by your health care provider. Neck exercises can be important for many reasons. They can improve strength and maintain flexibility in your neck, which will help your upper back and prevent neck pain. Stretching exercises Rotation neck stretching  Sit in a chair or stand up. Place your feet flat on the floor, shoulder-width apart. Slowly turn your head (rotate) to the right until a slight stretch is felt. Turn it all the way to the right so you can look over your right shoulder. Do not tilt or tip your head. Hold this position for 10-30 seconds. Slowly turn your head  (rotate) to the left until a slight stretch is felt. Turn it all the way to the left so you can look over your left shoulder. Do not tilt or tip your head. Hold this position for 10-30 seconds. Repeat __________ times. Complete this exercise __________ times a day. Neck retraction  Sit in a sturdy chair or stand up. Look straight ahead. Do not bend your neck. Use your fingers to push your chin backward (retraction). Do not bend your neck for this movement. Continue to face straight ahead. If you are doing the exercise properly, you will feel a slight sensation in your throat and a stretch at the back of your neck. Hold the stretch for 1-2 seconds. Repeat __________ times. Complete this exercise __________ times a day. Strengthening exercises Neck press  Lie on your back on a firm bed or on the floor with a pillow under your head. Use your neck muscles to push your head down on the pillow and straighten your spine. Hold the position as well as you can. Keep your head facing up (in a neutral position) and your chin tucked. Slowly count to 5 while holding this position. Repeat __________ times. Complete this exercise __________ times a day. Isometrics These are exercises in which you strengthen the muscles in your neck while keeping your neck still (isometrics). Sit in a supportive chair and place your hand on your forehead. Keep your head and face facing straight ahead. Do not flex or extend your neck while doing isometrics. Push forward with your head and neck while pushing back with your hand. Hold for 10 seconds. Do  the sequence again, this time putting your hand against the back of your head. Use your head and neck to push backward against the hand pressure. Finally, do the same exercise on either side of your head, pushing sideways against the pressure of your hand. Repeat __________ times. Complete this exercise __________ times a day. Prone head lifts  Lie face-down (prone position),  resting on your elbows so that your chest and upper back are raised. Start with your head facing downward, near your chest. Position your chin either on or near your chest. Slowly lift your head upward. Lift until you are looking straight ahead. Then continue lifting your head as far back as you can comfortably stretch. Hold your head up for 5 seconds. Then slowly lower it to your starting position. Repeat __________ times. Complete this exercise __________ times a day. Supine head lifts  Lie on your back (supine position), bending your knees to point to the ceiling and keeping your feet flat on the floor. Lift your head slowly off the floor, raising your chin toward your chest. Hold for 5 seconds. Repeat __________ times. Complete this exercise __________ times a day. Scapular retraction  Stand with your arms at your sides. Look straight ahead. Slowly pull both shoulders (scapulae) backward and downward (retraction) until you feel a stretch between your shoulder blades in your upper back. Hold for 10-30 seconds. Relax and repeat. Repeat __________ times. Complete this exercise __________ times a day. Contact a health care provider if: Your neck pain or discomfort gets worse when you do an exercise. Your neck pain or discomfort does not improve within 2 hours after you exercise. If you have any of these problems, stop exercising right away. Do not do the exercises again unless your health care provider says that you can. Get help right away if: You develop sudden, severe neck pain. If this happens, stop exercising right away. Do not do the exercises again unless your health care provider says that you can. This information is not intended to replace advice given to you by your health care provider. Make sure you discuss any questions you have with your health care provider. Document Revised: 06/12/2021 Document Reviewed: 06/12/2021 Elsevier Patient Education  [place]. Paresthesia Paresthesia is a burning or prickling feeling. This feeling can happen in any part of the body. It often happens in the hands, arms, legs, or feet. Usually, it is not painful. In most cases, the feeling goes away in a short time and is not a sign of a serious problem. If you have paresthesia that lasts a long time, you need to see your doctor. Follow these instructions at home: Nutrition Eat a healthy diet. This includes: Eating foods that are high in fiber. These include beans, whole grains, and fresh fruits and vegetables. Limiting foods that are high in fat and sugar. These include fried or sweet foods.  Alcohol use  Do not drink alcohol if: Your doctor tells you not to drink. You are pregnant, may be pregnant, or are planning to become pregnant. If you drink alcohol: Limit how much you have to: 0-1 drink a day for women. 0-2 drinks a day for men. Know how much alcohol is in your drink. In the [place] one drink equals one 12 oz bottle of beer (355 mL), one 5 oz glass of wine (148 mL), or one 1 oz glass of hard liquor (44 mL). General instructions Take over-the-counter and prescription medicines only as told by your doctor. Do  not smoke or use any products that contain nicotine or tobacco. If you need help quitting, ask your doctor. If you have diabetes, work with your doctor to make sure your blood sugar stays in a healthy range. If your feet feel numb: Check for redness, warmth, and swelling every day. Wear padded socks and comfortable shoes. These help protect your feet. Keep all follow-up visits. Contact a doctor if: You have paresthesia that gets worse or does not go away. You lose feeling (have numbness) after an injury. Your burning or prickling feeling gets worse when you walk. You have pain or cramps. You feel dizzy or you faint. You have a rash. Get help right away if: You feel weak or have new weakness in an arm or leg. You have trouble walking or  moving. You have problems speaking, understanding, or seeing. You feel confused. You cannot control when you pee (urinate) or poop (have a bowel movement). These symptoms may be an emergency. Get help right away. Call 911. Do not wait to see if the symptoms will go away. Do not drive yourself to the hospital. Summary Paresthesia is a burning or prickling feeling. It often happens in the hands, arms, legs, or feet. In most cases, the feeling goes away in a short time and is not a sign of a serious problem. If you have paresthesia that lasts a long time, you need to be seen by your doctor. This information is not intended to replace advice given to you by your health care provider. Make sure you discuss any questions you have with your health care provider. Document Revised: 08/27/2021 Document Reviewed: 08/27/2021 Elsevier Patient Education  [place].

## 2022-01-11 LAB — NEUROPATHY PANEL
A/G Ratio: 1.1 (ref 0.7–1.7)
Albumin ELP: 3.6 g/dL (ref 2.9–4.4)
Alpha 1: 0.2 g/dL (ref 0.0–0.4)
Alpha 2: 0.7 g/dL (ref 0.4–1.0)
Angio Convert Enzyme: 37 U/L (ref 14–82)
Anti Nuclear Antibody (ANA): NEGATIVE
Beta: 1.1 g/dL (ref 0.7–1.3)
Gamma Globulin: 1.1 g/dL (ref 0.4–1.8)
Globulin, Total: 3.2 g/dL (ref 2.2–3.9)
Rheumatoid fact SerPl-aCnc: <10 IU/mL (ref <14.0)
Sed Rate: 7 mm/hr (ref 0–32)
TSH: 0.818 u[IU]/mL (ref 0.450–4.500)
Total Protein: 6.8 g/dL (ref 6.0–8.5)
Vit D, 25-Hydroxy: 50.6 ng/mL (ref 30.0–100.0)
Vitamin B-12: 723 pg/mL (ref 232–1245)

## 2022-01-21 ENCOUNTER — Encounter: Payer: Self-pay | Admitting: Neurology

## 2022-01-21 ENCOUNTER — Telehealth: Payer: Self-pay

## 2022-01-21 NOTE — Telephone Encounter (Signed)
Pt called to inquire about scheduling sclerotherapy. She is going to reach out to both cardiology and neuro to confirm they are both okay with her holding ASA.

## 2022-01-28 ENCOUNTER — Encounter: Payer: Self-pay | Admitting: Neurology

## 2022-01-30 NOTE — Telephone Encounter (Signed)
Received request for patient to hold ASA for sclerotherapy procedure. She is at low risk of holding ASA 7 days for procedure if needed. OK to do so from cardiac perspective.   Marissa Burnett 01/30/2022 3:22 PM

## 2022-01-31 ENCOUNTER — Encounter: Payer: Self-pay | Admitting: Family Medicine

## 2022-02-07 ENCOUNTER — Ambulatory Visit (INDEPENDENT_AMBULATORY_CARE_PROVIDER_SITE_OTHER): Payer: No Typology Code available for payment source | Admitting: Physician Assistant

## 2022-02-07 ENCOUNTER — Encounter: Payer: Self-pay | Admitting: Physician Assistant

## 2022-02-07 ENCOUNTER — Other Ambulatory Visit: Payer: Self-pay

## 2022-02-07 DIAGNOSIS — D485 Neoplasm of uncertain behavior of skin: Secondary | ICD-10-CM | POA: Diagnosis not present

## 2022-02-07 DIAGNOSIS — L710 Perioral dermatitis: Secondary | ICD-10-CM | POA: Diagnosis not present

## 2022-02-07 MED ORDER — MINOCYCLINE HCL 50 MG PO CAPS: 50.0000 mg | ORAL_CAPSULE | Freq: Two times a day (BID) | ORAL | 3 refills | Status: DC

## 2022-02-07 NOTE — Patient Instructions (Signed)

## 2022-02-11 ENCOUNTER — Encounter: Payer: Self-pay | Admitting: Physician Assistant

## 2022-02-13 ENCOUNTER — Telehealth: Payer: Self-pay | Admitting: Physician Assistant

## 2022-02-13 NOTE — Telephone Encounter (Signed)
Pathology to patient.  °

## 2022-02-13 NOTE — Telephone Encounter (Signed)
Patient is calling for pathology results from last visit with Kelli Sheffield, PA-C. 

## 2022-02-21 DIAGNOSIS — N838 Other noninflammatory disorders of ovary, fallopian tube and broad ligament: Secondary | ICD-10-CM | POA: Insufficient documentation

## 2022-02-25 ENCOUNTER — Other Ambulatory Visit: Payer: Self-pay

## 2022-02-25 ENCOUNTER — Ambulatory Visit (INDEPENDENT_AMBULATORY_CARE_PROVIDER_SITE_OTHER): Payer: No Typology Code available for payment source

## 2022-02-25 DIAGNOSIS — M7989 Other specified soft tissue disorders: Secondary | ICD-10-CM

## 2022-02-25 DIAGNOSIS — I8393 Asymptomatic varicose veins of bilateral lower extremities: Secondary | ICD-10-CM

## 2022-02-25 NOTE — Progress Notes (Signed)
Treated pt's reticular veins of posterior BLE with Asclera 1% administered with a 27 gauge butterfly needle. Pt received a total of 3.5 mL. Easy access. Anticipate good results. Pt tolerated very well. Pt held ASA per prescriber's approval. Post procedure care instructions given on both handout and verbally. Will follow prn.

## 2022-02-25 NOTE — Progress Notes (Signed)
Pt was put in 20-30 thigh high compression stockings at completion of sclerotherapy and is aware she is supposed to wear them for 2 days continuously and then another 12 days all day.

## 2022-03-03 NOTE — Progress Notes (Signed)
° °  Follow-Up Visit   Subjective  Marissa Burnett is a 36 y.o. female who presents for the following: Follow-up (Perioral dermatitis much better with mcn bid 100 mg and protopic. Recheck right upper lip lesion seems to be getting larger she is scheduled for micro needling.).   The following portions of the chart were reviewed this encounter and updated as appropriate:  Tobacco   Allergies   Meds   Problems   Med Hx   Surg Hx   Fam Hx       Objective  Well appearing patient in no apparent distress; mood and affect are within normal limits.  A full examination was performed including scalp, head, eyes, ears, nose, lips, neck, chest, axillae, abdomen, back, buttocks, bilateral upper extremities, bilateral lower extremities, hands, feet, fingers, toes, fingernails, and toenails. All findings within normal limits unless otherwise noted below.  Generalized, Mid Upper Vermilion Lip Erythema and slight scale  Right Upper Vermilion Lip Hyperkeratotic scale with pink base         Assessment & Plan  Perioral dermatitis Mid Upper Vermilion Lip; Generalized  minocycline (MINOCIN) 50 MG capsule - Generalized, Mid Upper Vermilion Lip Take 1 capsule (50 mg total) by mouth 2 (two) times daily.  Related Medications tacrolimus (PROTOPIC) 0.1 % ointment Apply topically at bedtime.  Neoplasm of uncertain behavior of skin Right Upper Vermilion Lip  Skin / nail biopsy Type of biopsy: tangential   Informed consent: discussed and consent obtained   Timeout: patient name, date of birth, surgical site, and procedure verified   Anesthesia: the lesion was anesthetized in a standard fashion   Anesthetic:  1% lidocaine w/ epinephrine 1-100,000 local infiltration Instrument used: flexible razor blade   Hemostasis achieved with: aluminum chloride and electrodesiccation   Outcome: patient tolerated procedure well   Post-procedure details: wound care instructions given    Specimen 1 -  Surgical pathology Differential Diagnosis: bcc vs scc  Check Margins: No    I, Shykeem Resurreccion, PA-C, have reviewed all documentation's for this visit.  The documentation on 03/03/22 for the exam, diagnosis, procedures and orders are all accurate and complete.

## 2022-03-06 ENCOUNTER — Encounter: Payer: No Typology Code available for payment source | Admitting: Neurology

## 2022-03-18 ENCOUNTER — Ambulatory Visit (INDEPENDENT_AMBULATORY_CARE_PROVIDER_SITE_OTHER): Payer: No Typology Code available for payment source | Admitting: Cardiovascular Disease

## 2022-03-18 ENCOUNTER — Encounter: Payer: Self-pay | Admitting: Cardiovascular Disease

## 2022-03-18 ENCOUNTER — Other Ambulatory Visit: Payer: Self-pay

## 2022-03-18 ENCOUNTER — Ambulatory Visit (HOSPITAL_COMMUNITY): Payer: No Typology Code available for payment source | Attending: Cardiology

## 2022-03-18 VITALS — BP 112/80 | HR 95 | Ht 64.0 in | Wt 161.4 lb

## 2022-03-18 DIAGNOSIS — Q211 Atrial septal defect, unspecified: Secondary | ICD-10-CM

## 2022-03-18 LAB — ECHOCARDIOGRAM COMPLETE
Area-P 1/2: 4.12 cm2
S' Lateral: 3 cm

## 2022-03-18 NOTE — Patient Instructions (Signed)
Medication Instructions:  ?Your physician recommends that you continue on your current medications as directed. Please refer to the Current Medication list given to you today. ? ?*If you need a refill on your cardiac medications before your next appointment, please call your pharmacy* ? ? ?Lab Work: ?NONE ?If you have labs (blood work) drawn today and your tests are completely normal, you will receive your results only by: ?MyChart Message (if you have MyChart) OR ?A paper copy in the mail ?If you have any lab test that is abnormal or we need to change your treatment, we will call you to review the results. ? ? ?Testing/Procedures: ?NONE ? ? ?Follow-Up: ?At [facility], you and your health needs are our priority.  As part of our continuing mission to provide you with exceptional heart care, we have created designated Provider Care Teams.  These Care Teams include your primary Cardiologist (physician) and Advanced Practice Providers (APPs -  Physician Assistants and Nurse Practitioners) who all work together to provide you with the care you need, when you need it. ? ?We recommend signing up for the patient portal called "MyChart".  Sign up information is provided on this After Visit Summary.  MyChart is used to connect with patients for Virtual Visits (Telemedicine).  Patients are able to view lab/test results, encounter notes, upcoming appointments, etc.  Non-urgent messages can be sent to your provider as well.   ?To learn more about what you can do with MyChart, go to [email or web address].   ? ?Your next appointment:   ?As Needed ?  ?

## 2022-03-18 NOTE — Progress Notes (Signed)
?Cardiology Office Note:   ? ?Date:  03/18/2022  ? ?ID:  Marissa Burnett, DOB [birth year], MRN [id number] ? ?PCP:  [name], MD ?  ?[facility] ?Cardiologist:  Sherren Mocha, MD    ? ?Referring MD: [name], MD  ? ?Chief Complaint  ?Patient presents with  ? PFO  ? ? ?History of Present Illness:   ? ?Marissa Burnett is a 36 y.o. female with a hx of: ?Hx of Migraine HAs w/ Aura ?Hx of L MCA territory CVA (2-3 punctate ischemic infarcts) ?Ostium Secundum ASD  ?S/p closure 08/30/2020  ?Nickel allergy  ?Incomplete RBBB ?Premature atrial contractions, symptomatic ?Briefly admitted 08/2020 after ASD closure ?Seen by EP 09/2020 >> beta-blocker Rx  ? ?The patient is here alone today.  She is doing very well with no recent symptoms of chest pain, chest pressure, palpitations, shortness of breath, lightheadedness, or syncope.  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  She and her husband have been raising their 59-year-old nephew.  Her oldest daughter is at [employer] state and her youngest daughter is a sophomore in high school. ? ?Past Medical History:  ?Diagnosis Date  ? Abnormal Papanicolaou smear of cervix 10/15/2016  ? LSIL+HPV  ? Allergy   ? Anxiety   ? ASD (atrial septal defect) 07/28/2020  ? Chronic lumbar pain 09/27/2018  ? Patient has seen Dr. Arnoldo Morale neurosurgery September 2019-they do not feel there is anything surgical that can help-they are recommending injections with pain medicine specialist  ? COVID   ? Gallstones   ? GERD (gastroesophageal reflux disease)   ? Heart murmur birth  ? Migraine   ? Stroke ([place]) 07/12/2020  ? a. L MCA stroke in 06/2020.  ? TIA (transient ischemic attack)   ? ? ?Past Surgical History:  ?Procedure Laterality Date  ? ABDOMINOPLASTY    ? ATRIAL SEPTAL DEFECT(ASD) CLOSURE N/A 08/30/2020  ? Procedure: ATRIAL SEPTAL DEFECT (ASD) CLOSURE;  Surgeon: Sherren Mocha, MD;  Location: [facility];  Service:  Cardiovascular;  Laterality: N/A;  ? BREAST ENHANCEMENT SURGERY    ? CERVICAL CONIZATION W/BX N/A 12/13/2016  ? Procedure: CONIZATION CERVIX WITH BIOPSY--cold knife conization;  Surgeon: Jonnie Kind, MD;  Location: [facility];  Service: Gynecology;  Laterality: N/A;  ? CHOLECYSTECTOMY  02/2002  ? ENDOSCOPIC RETROGRADE CHOLANGIOPANCREATOGRAPHY (ERCP) WITH PROPOFOL    ? OTHER SURGICAL HISTORY    ? stent in kidney during pregnancy,2002; removed in 2003  ? TEE WITHOUT CARDIOVERSION N/A 08/04/2020  ? Procedure: TRANSESOPHAGEAL ECHOCARDIOGRAM (TEE);  Surgeon: Larey Dresser, MD;  Location[facility];  Service: Cardiovascular;  Laterality: N/A;  ? ? ?Current Medications: ?Current Meds  ?Medication Sig  ? acetaminophen (TYLENOL) 500 MG tablet Take 1,000 mg by mouth daily as needed for headache.   ? ALPRAZolam (XANAX) 0.5 MG tablet TAKE 1 TABLET(0.5 MG) BY MOUTH TWICE DAILY AS NEEDED FOR ANXIETY  ? aspirin (ASPIRIN LOW DOSE) 81 MG EC tablet TAKE 1 TABLET (81 MG TOTAL) BY MOUTH DAILY. SWALLOW WHOLE.  ? BIOTIN PO Take 1 tablet by mouth daily.   ? EPINEPHrine 0.3 mg/0.3 mL IJ SOAJ injection Inject 0.3 mg into the muscle once as needed for up to 1 dose for anaphylaxis.  ? loratadine (CLARITIN) 10 MG tablet Take 10 mg by mouth daily.  ? Polyvinyl Alcohol-Povidone (REFRESH OP) Place 1 drop into both eyes daily as needed (dry eyes).  ? Rimegepant Sulfate (NURTEC)  75 MG TBDP Take 1 tablet (75 mg) by mouth at first sign of migraine. Do not take more than 1 every 24 hours.  ?  ? ?Allergies:   Patient has no known allergies.  ? ?Social History  ? ?Socioeconomic History  ? Marital status: Married  ?  Spouse name: Not on file  ? Number of children: 2  ? Years of education: Not on file  ? Highest education level: Not on file  ?Occupational History  ? Occupation: resp therapist  ?  Employer: [employer]  ?Tobacco Use  ? Smoking status: Never  ? Smokeless tobacco: Never  ?Vaping Use  ? Vaping Use: Never used  ?Substance and Sexual  Activity  ? Alcohol use: Not Currently  ?  Comment: social  ? Drug use: No  ? Sexual activity: Yes  ?  Birth control/protection: None  ?Other Topics Concern  ? Not on file  ?Social History Narrative  ? Married, respiratory therapist [employer] [facility]  ? 2 children  ? Occasional alcohol, never smoker no drug use  ? Right handed  ? Drinks 1-2 cups coffee daily  ? ?Social Determinants of Health  ? ?Financial Resource Strain: Not on file  ?Food Insecurity: Not on file  ?Transportation Needs: Not on file  ?Physical Activity: Not on file  ?Stress: Not on file  ?Social Connections: Not on file  ?  ? ?Family History: ?The patient's family history includes ADD / ADHD in her daughter; Anxiety disorder in her daughter; COPD in her maternal grandfather; Colon polyps in her paternal grandmother; Diabetes in her maternal grandmother and another family member; Heart disease in her paternal grandmother; Irritable bowel syndrome in her paternal grandmother; Lung cancer in her paternal grandfather. There is no history of Allergic rhinitis, Angioedema, Asthma, Atopy, Eczema, Immunodeficiency, Urticaria, Colon cancer, Esophageal cancer, Pancreatic cancer, or Liver disease. ? ?ROS:   ?Please see the history of present illness.    ?All other systems reviewed and are negative. ? ?EKGs/Labs/Other Studies Reviewed:   ? ?The following studies were reviewed today: ?Echo images are personally reviewed.  LV and RV function are normal, there is no significant valvular abnormality appreciated, and the atrial septal occluder device is in place with no residual color flow.  The formal interpretation is currently pending findings reflect my review of her study. ? ?EKG:  EKG is not ordered today.   ? ?Recent Labs: ?10/12/2021: BUN 15; Creatinine, Ser 0.66; Magnesium 2.0; Potassium 4.3; Sodium 139 ?01/08/2022: TSH 0.818  ?Recent Lipid Panel ?   ?Component Value Date/Time  ? CHOL 164 05/10/2021 0836  ? TRIG 66 05/10/2021 0836  ? HDL 71 05/10/2021 0836   ? CHOLHDL 2.3 05/10/2021 0836  ? CHOLHDL 2.4 07/28/2020 0351  ? VLDL 9 07/28/2020 0351  ? [place] 80 05/10/2021 0836  ? ? ? ?Risk Assessment/Calculations:   ?  ? ?    ? ?Physical Exam:   ? ?VS:  BP 112/80   Pulse 95   Ht '5\' 4"'$  (1.626 m)   Wt 161 lb 6.4 oz (73.2 kg)   SpO2 99%   BMI 27.70 kg/m?    ? ?Wt Readings from Last 3 Encounters:  ?03/18/22 161 lb 6.4 oz (73.2 kg)  ?01/08/22 161 lb (73 kg)  ?10/24/21 162 lb (73.5 kg)  ?  ? ?GEN:  Well nourished, well developed in no acute distress ?HEENT: Normal ?NECK: No JVD; No carotid bruits ?LYMPHATICS: No lymphadenopathy ?CARDIAC: RRR, no murmurs, rubs, gallops ?RESPIRATORY:  Clear to  auscultation without rales, wheezing or rhonchi  ?ABDOMEN: Soft, non-tender, non-distended ?MUSCULOSKELETAL:  No edema; No deformity  ?SKIN: Warm and dry ?NEUROLOGIC:  Alert and oriented x 3 ?PSYCHIATRIC:  Normal affect  ? ?ASSESSMENT:   ? ?1. ASD (atrial septal defect)   ? ?PLAN:   ? ?In order of problems listed above: ? ?The patient is status post transcatheter closure of her ASD after suffering a cryptogenic stroke.  She is doing very well at present.  She remains on aspirin 81 mg daily.  Her ASD occluder is in normal position with no color flow seen around the device.  Cardiac function appears normal.  She previously had a bubble study as well as a transcranial Doppler study which were both negative after her closure procedure.  She will follow-up on an as-needed basis.  Overall I think she is doing very well. ? ?   ? ?Medication Adjustments/Labs and Tests Ordered: ?Current medicines are reviewed at length with the patient today.  Concerns regarding medicines are outlined above.  ?No orders of the defined types were placed in this encounter. ? ?No orders of the defined types were placed in this encounter. ? ? ?Patient Instructions  ?Medication Instructions:  ?Your physician recommends that you continue on your current medications as directed. Please refer to the Current Medication  list given to you today. ? ?*If you need a refill on your cardiac medications before your next appointment, please call your pharmacy* ? ? ?Lab Work: ?NONE ?If you have labs (blood work) drawn today and your tests are

## 2022-03-19 ENCOUNTER — Other Ambulatory Visit (HOSPITAL_COMMUNITY): Payer: Self-pay

## 2022-03-20 ENCOUNTER — Telehealth: Payer: Self-pay

## 2022-03-20 NOTE — Telephone Encounter (Signed)
Called pt to f/u on sclerotherapy for reticular veins from 4 weeks ago. Pt felt initially saw tremendous improvement and then gradually the veins are coming back. We discussed another treatment but to give it a bit more time to tell how they are responding. Will follow prn. ?

## 2022-04-01 IMAGING — DX DG CHEST 1V PORT
1 series · 1 of 1 positions shown · non-contrast
Comparison: January 29, 2006

CLINICAL DATA: Arrhythmia, tachycardia.

EXAM:
PORTABLE CHEST 1 VIEW

[chest ap]
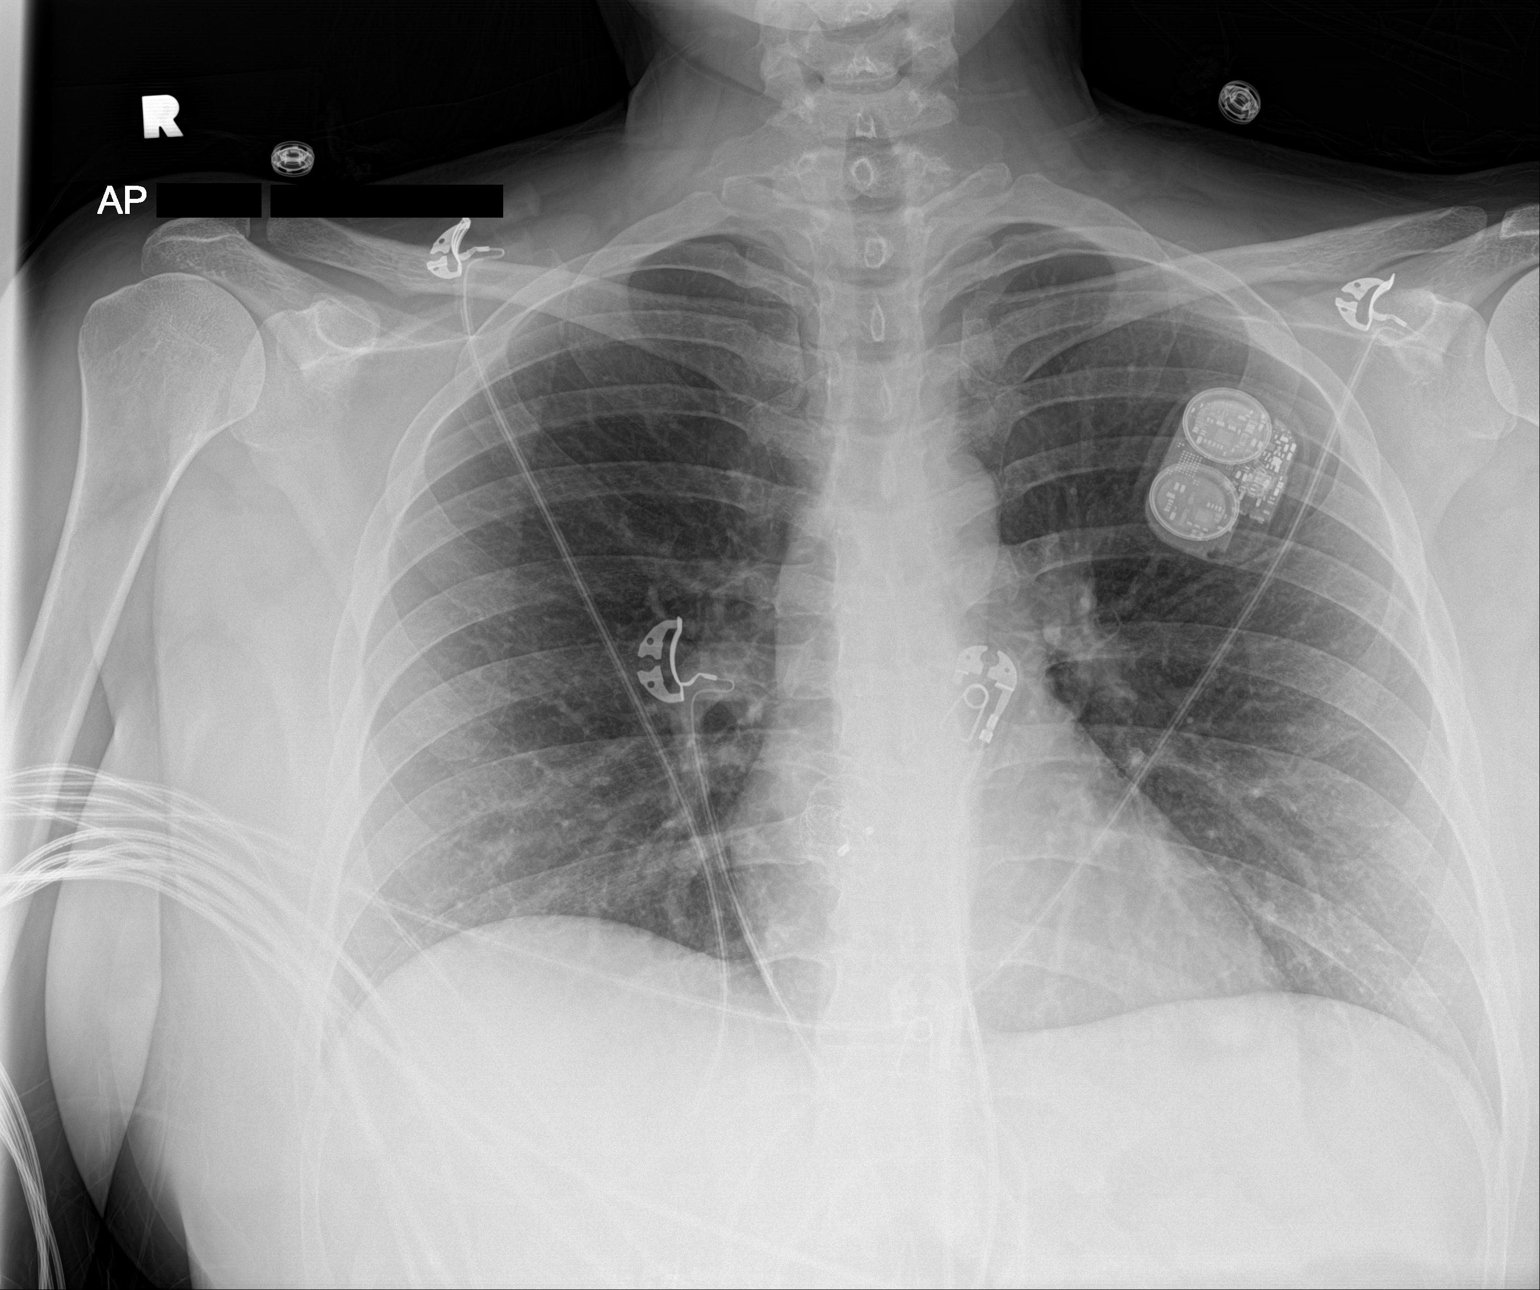

[1 of 1 positions shown; findings below may reference images not displayed]

FINDINGS: The heart size and mediastinal contours are within normal limits. A
radiopaque stimulator device is seen overlying the upper left lung.
A radiopaque patent foramina ovale closure device is also seen. Both
lungs are clear. The visualized skeletal structures are
unremarkable.
IMPRESSION: No active disease.

## 2022-04-04 ENCOUNTER — Ambulatory Visit (INDEPENDENT_AMBULATORY_CARE_PROVIDER_SITE_OTHER): Payer: No Typology Code available for payment source | Admitting: Family Medicine

## 2022-04-04 ENCOUNTER — Ambulatory Visit (HOSPITAL_COMMUNITY)
Admission: RE | Admit: 2022-04-04 | Discharge: 2022-04-04 | Disposition: A | Discharge status: Home / Self Care | Payer: No Typology Code available for payment source | Source: Ambulatory Visit | Attending: Family Medicine | Admitting: Family Medicine

## 2022-04-04 VITALS — BP 123/82 | HR 105 | Temp 98.3°F | Ht 64.0 in | Wt 156.6 lb

## 2022-04-04 DIAGNOSIS — R599 Enlarged lymph nodes, unspecified: Secondary | ICD-10-CM | POA: Diagnosis not present

## 2022-04-04 DIAGNOSIS — R221 Localized swelling, mass and lump, neck: Secondary | ICD-10-CM

## 2022-04-04 NOTE — Patient Instructions (Signed)
Lab today. ? ?We will call about [place]. ? ?Take care ? ?Dr. Lacinda Axon ?

## 2022-04-05 DIAGNOSIS — R599 Enlarged lymph nodes, unspecified: Secondary | ICD-10-CM | POA: Insufficient documentation

## 2022-04-05 LAB — CBC WITH DIFFERENTIAL/PLATELET
Basophils Absolute: 0.1 10*3/uL (ref 0.0–0.2)
Basos: 1 %
EOS (ABSOLUTE): 0.2 10*3/uL (ref 0.0–0.4)
Eos: 3 %
Hematocrit: 40.2 % (ref 34.0–46.6)
Hemoglobin: 13.4 g/dL (ref 11.1–15.9)
Immature Grans (Abs): 0 10*3/uL (ref 0.0–0.1)
Immature Granulocytes: 0 %
Lymphocytes Absolute: 1.8 10*3/uL (ref 0.7–3.1)
Lymphs: 29 %
MCH: 30.5 pg (ref 26.6–33.0)
MCHC: 33.3 g/dL (ref 31.5–35.7)
MCV: 91 fL (ref 79–97)
Monocytes Absolute: 0.4 10*3/uL (ref 0.1–0.9)
Monocytes: 6 %
Neutrophils Absolute: 3.7 10*3/uL (ref 1.4–7.0)
Neutrophils: 61 %
Platelets: 212 10*3/uL (ref 150–450)
RBC: 4.4 x10E6/uL (ref 3.77–5.28)
RDW: 11.3 % — ABNORMAL LOW (ref 11.7–15.4)
WBC: 6.1 10*3/uL (ref 3.4–10.8)

## 2022-04-05 NOTE — Assessment & Plan Note (Addendum)
CBC normal.  ?[place] confirmed sub centimeter lymph node. ?No concerning features at this time. ?Watchful waiting. Should self resolve.  ?

## 2022-04-05 NOTE — Progress Notes (Signed)
? ?Subjective:  ?Patient ID: Marissa Burnett, female    DOB: 10/15/1986  Age: 36 y.o. MRN: [id number] ? ?CC: ?Chief Complaint  ?Patient presents with  ? Cyst  ?  On left side of neck. Patient says it is a pea size. Noticed the knot a little over 2 weeks ago.  ? ? ?HPI: ? ?36 year old female presents for evaluation of the above.  ? ?Patient reports that she has recently noticed a palpable area on the lower aspect of the left side of her neck.  She noticed this approximately 2 weeks ago.  The area is not tender.  It is small.  She is concerned about this.  No breast lumps/masses. No fever or other associated symptoms. No other complaints at this time. ? ?Patient Active Problem List  ? Diagnosis Date Noted  ? Palpable lymph node 04/05/2022  ? Palpitations 09/26/2020  ? Premature atrial contractions 09/25/2020  ? Atrial septal defect 08/30/2020  ? CVA (cerebral vascular accident) ([place]) 07/27/2020  ? Abnormal MRI 07/27/2020  ? GERD (gastroesophageal reflux disease) 07/27/2020  ? Migraine 07/27/2020  ? Insomnia 04/30/2020  ? Gastroesophageal reflux disease with esophagitis 07/01/2019  ? Chronic lumbar pain 09/27/2018  ? CIN III (cervical intraepithelial neoplasia grade III) with severe dysplasia 11/18/2016  ? Abnormal Papanicolaou smear of cervix 10/15/2016  ? Urticaria 03/16/2016  ? Anxiety 06/30/2015  ? ADD (attention deficit disorder) 07/20/2014  ? ? ?Social Hx   ?Social History  ? ?Socioeconomic History  ? Marital status: Married  ?  Spouse name: Not on file  ? Number of children: 2  ? Years of education: Not on file  ? Highest education level: Not on file  ?Occupational History  ? Occupation: resp therapist  ?  Employer: [employer]  ?Tobacco Use  ? Smoking status: Never  ? Smokeless tobacco: Never  ?Vaping Use  ? Vaping Use: Never used  ?Substance and Sexual Activity  ? Alcohol use: Not Currently  ?  Comment: social  ? Drug use: No  ? Sexual activity: Yes  ?  Birth control/protection: None  ?Other Topics Concern  ?  Not on file  ?Social History Narrative  ? Married, respiratory therapist [facility] [employer]  ? 2 children  ? Occasional alcohol, never smoker no drug use  ? Right handed  ? Drinks 1-2 cups coffee daily  ? ?Social Determinants of Health  ? ?Financial Resource Strain: Not on file  ?Food Insecurity: Not on file  ?Transportation Needs: Not on file  ?Physical Activity: Not on file  ?Stress: Not on file  ?Social Connections: Not on file  ? ? ?Review of Systems ?Per HPI ? ?Objective:  ?BP 123/82   Pulse (!) 105   Temp 98.3 ?F (36.8 ?C) (Oral)   Ht '5\' 4"'$  (1.626 m)   Wt 156 lb 9.6 oz (71 kg)   SpO2 100%   BMI 26.88 kg/m?  ? ? ?  04/04/2022  ?  8:37 AM 03/18/2022  ?  3:05 PM 01/08/2022  ? 11:02 AM  ?BP/Weight  ?Systolic BP 220 254 270  ?Diastolic BP 82 80 88  ?Wt. (Lbs) 156.6 161.4 161  ?BMI 26.88 kg/m2 27.7 kg/m2 27.64 kg/m2  ? ? ?Physical Exam ?Constitutional:   ?   General: She is not in acute distress. ?   Appearance: Normal appearance.  ?HENT:  ?   Head: Normocephalic and atraumatic.  ?Neck:  ? ?   Comments: Small palpable firm area at the labelled location. Consistent  with a lymph node. ?Cardiovascular:  ?   Rate and Rhythm: Normal rate and regular rhythm.  ?Pulmonary:  ?   Effort: Pulmonary effort is normal.  ?   Breath sounds: Normal breath sounds.  ?Neurological:  ?   Mental Status: She is alert.  ?Psychiatric:     ?   Mood and Affect: Mood normal.     ?   Behavior: Behavior normal.  ? ? ?Lab Results  ?Component Value Date  ? WBC 6.1 04/04/2022  ? HGB 13.4 04/04/2022  ? HCT 40.2 04/04/2022  ? PLT 212 04/04/2022  ? GLUCOSE 88 10/12/2021  ? CHOL 164 05/10/2021  ? TRIG 66 05/10/2021  ? HDL 71 05/10/2021  ? [place] 80 05/10/2021  ? ALT 13 07/27/2020  ? AST 18 07/27/2020  ? NA 139 10/12/2021  ? K 4.3 10/12/2021  ? CL 103 10/12/2021  ? CREATININE 0.66 10/12/2021  ? BUN 15 10/12/2021  ? CO2 21 10/12/2021  ? TSH 0.818 01/08/2022  ? INR 1.0 07/27/2020  ? HGBA1C 4.7 (L) 07/28/2020  ? ? ? ?Assessment & Plan:  ? ?Problem List  Items Addressed This Visit   ? ?  ? Other  ? Palpable lymph node  ?  CBC normal.  ?[place] confirmed sub centimeter lymph node. ?No concerning features at this time. ?Watchful waiting. Should self resolve.  ?  ?  ? ?Other Visit Diagnoses   ? ? Neck mass    -  Primary  ? Relevant Orders  ? CBC with Differential (Completed)  ? US Soft Tissue Head/Neck (NON-THYROID) (Completed)  ? ?  ? ?Thersa Salt DO ?[place] ? ?

## 2022-04-16 ENCOUNTER — Other Ambulatory Visit (HOSPITAL_COMMUNITY): Payer: Self-pay

## 2022-05-08 ENCOUNTER — Encounter: Payer: No Typology Code available for payment source | Admitting: Neurology

## 2022-05-10 ENCOUNTER — Ambulatory Visit (INDEPENDENT_AMBULATORY_CARE_PROVIDER_SITE_OTHER): Payer: No Typology Code available for payment source | Admitting: Family Medicine

## 2022-05-10 VITALS — BP 137/87 | HR 95 | Temp 98.2°F | Ht 64.0 in | Wt 159.4 lb

## 2022-05-10 DIAGNOSIS — R599 Enlarged lymph nodes, unspecified: Secondary | ICD-10-CM | POA: Diagnosis not present

## 2022-05-10 NOTE — Progress Notes (Signed)
? ?  Subjective:  ? ? Patient ID: Marissa Burnett, female    DOB: 18-Jun-1986, 36 y.o.   MRN: [id number] ? ?HPI ? ?Patient here for follow up on swollen lymph nodes. Patient says still little knot there. ?The area on her neck has not gotten any smaller ?She truly does not know how long she has had this ?She relates no fevers chills sweats or weight loss ?Overall feels well ?Ultrasound showed subcentimeter lymph node ?Review of Systems ?She states her gynecologist did a breast exam and did not find any problems ?   ?Objective:  ? Physical Exam ?Persistent lymph node is noted ?No other masses are felt in her neck ?Lungs are clear hearts regular ? ?This area does not appear to be enlarging ? ? ?   ?Assessment & Plan:  ?Small lymph node noted ?No appreciable change ?I do not feel that CT scan warranted currently ?Await further follow-up in 3 months ?We will touch base with Dr.Teoh regarding this ? ?

## 2022-05-19 ENCOUNTER — Encounter: Payer: Self-pay | Admitting: Family Medicine

## 2022-05-20 ENCOUNTER — Other Ambulatory Visit: Payer: Self-pay | Admitting: Family Medicine

## 2022-05-20 MED ORDER — CEFPROZIL 500 MG PO TABS
ORAL_TABLET | ORAL | 0 refills | Status: DC
Start: 2022-05-20 — End: 2022-07-05

## 2022-05-20 NOTE — Telephone Encounter (Signed)
Nurses It is standard and customary to do 1 round of antibiotic in a situation such as this Recommend Cefzil 500 mg 1 twice daily for 10 days Dr. Benjamine Mola should connect back with me this week We will let Lamiyah know what he had to say Either myself or Dr.Teoh we will do a follow-up office visit in a few weeks. Await Dr.Teoh input first  May go forth with the antibiotics

## 2022-05-22 NOTE — Progress Notes (Signed)
Message left for Dr Benjamine Mola to contact Dr Wolfgang Phoenix on his cell number (05/22/22)

## 2022-05-23 ENCOUNTER — Encounter: Payer: Self-pay | Admitting: Family Medicine

## 2022-05-23 DIAGNOSIS — Z1329 Encounter for screening for other suspected endocrine disorder: Secondary | ICD-10-CM

## 2022-05-23 DIAGNOSIS — R590 Localized enlarged lymph nodes: Secondary | ICD-10-CM

## 2022-05-23 NOTE — Telephone Encounter (Signed)
Nurses Please go ahead with referral to Dr.Teoh regarding cervical lymphadenopathy it would be best for this appointment to be approximately 6 weeks from now as a follow-up to our evaluation  Please send notification to Marieclaire that this referral has been placed and that it is perfectly fine to follow-up with ENT regarding this matter if we can be of more help let us know thanks

## 2022-05-23 NOTE — Telephone Encounter (Signed)
Likelihood of this being related to thyroid (as in the neck nodules) Is extremely low  If Marissa Burnett would like to have TSH free T4 to check her thyroid function again because of hair loss that would be fine but I do not think the 2 are related-thanks

## 2022-05-28 NOTE — Addendum Note (Signed)
Addended by: Vicente Males on: 05/28/2022 02:18 PM   Modules accepted: Orders

## 2022-06-16 ENCOUNTER — Encounter: Payer: Self-pay | Admitting: Emergency Medicine

## 2022-06-16 ENCOUNTER — Other Ambulatory Visit: Payer: Self-pay

## 2022-06-16 ENCOUNTER — Ambulatory Visit
Admission: EM | Admit: 2022-06-16 | Discharge: 2022-06-16 | Disposition: A | Discharge status: Home / Self Care | Payer: No Typology Code available for payment source | Attending: Student | Admitting: Student

## 2022-06-16 DIAGNOSIS — S90852A Superficial foreign body, left foot, initial encounter: Secondary | ICD-10-CM

## 2022-06-16 MED ORDER — CEPHALEXIN 500 MG PO CAPS: 500.0000 mg | ORAL_CAPSULE | Freq: Four times a day (QID) | ORAL | 0 refills | Status: DC

## 2022-06-16 NOTE — ED Triage Notes (Addendum)
Pt reports stepped in mulch yesterday while retrieving a baseball for kids yesterday. Pt reports has tried to retrieve it at home but reports unable to get it out.   Small black dot noted to bottom of right foot. No redness noted.

## 2022-06-16 NOTE — Discharge Instructions (Addendum)
-  If redness, swelling, drainage - -Start the antibiotic: Keflex, 4x daily x5 days. You can take this with food if you have a sensitive stomach. -Wash with gentle soap and water -Keep covered while walking or getting feet dirty  -Soak feet twice daily

## 2022-06-16 NOTE — ED Provider Notes (Signed)
[facility]    CSN: [id number] Arrival date & time: 06/16/22  6256      History   Chief Complaint Chief Complaint  Patient presents with   Foreign Body in Skin    HPI Marissa Burnett is a 36 y.o. female presenting with splinter R foot x1 day. History CVA on daily ASA. States splinter from walking in mulch. Attempted to get it out at home without success. States going to [employer] tomorrow and so she is concerned about infection while there.    HPI  Past Medical History:  Diagnosis Date   Abnormal Papanicolaou smear of cervix 10/15/2016   LSIL+HPV   Allergy    Anxiety    ASD (atrial septal defect) 07/28/2020   Chronic lumbar pain 09/27/2018   Patient has seen Dr. Arnoldo Morale neurosurgery September 2019-they do not feel there is anything surgical that can help-they are recommending injections with pain medicine specialist   COVID    Gallstones    GERD (gastroesophageal reflux disease)    Heart murmur birth   Migraine    Stroke ([facility]) 07/12/2020   a. L MCA stroke in 06/2020.   TIA (transient ischemic attack)     Patient Active Problem List   Diagnosis Date Noted   Palpable lymph node 04/05/2022   Palpitations 09/26/2020   Premature atrial contractions 09/25/2020   Atrial septal defect 08/30/2020   CVA (cerebral vascular accident) ([place]) 07/27/2020   Abnormal MRI 07/27/2020   GERD (gastroesophageal reflux disease) 07/27/2020   Migraine 07/27/2020   Insomnia 04/30/2020   Gastroesophageal reflux disease with esophagitis 07/01/2019   Chronic lumbar pain 09/27/2018   CIN III (cervical intraepithelial neoplasia grade III) with severe dysplasia 11/18/2016   Abnormal Papanicolaou smear of cervix 10/15/2016   Urticaria 03/16/2016   Anxiety 06/30/2015   ADD (attention deficit disorder) 07/20/2014    Past Surgical History:  Procedure Laterality Date   ABDOMINOPLASTY     ATRIAL SEPTAL DEFECT(ASD) CLOSURE N/A 08/30/2020   Procedure: ATRIAL SEPTAL DEFECT (ASD)  CLOSURE;  Surgeon: Sherren Mocha, MD;  Location: [facility];  Service: Cardiovascular;  Laterality: N/A;   BREAST ENHANCEMENT SURGERY     CERVICAL CONIZATION W/BX N/A 12/13/2016   Procedure: CONIZATION CERVIX WITH BIOPSY--cold knife conization;  Surgeon: Jonnie Kind, MD;  Location: [facility];  Service: Gynecology;  Laterality: N/A;   CHOLECYSTECTOMY  02/2002   ENDOSCOPIC RETROGRADE CHOLANGIOPANCREATOGRAPHY (ERCP) WITH PROPOFOL     OTHER SURGICAL HISTORY     stent in kidney during pregnancy,2002; removed in 2003   TEE WITHOUT CARDIOVERSION N/A 08/04/2020   Procedure: TRANSESOPHAGEAL ECHOCARDIOGRAM (TEE);  Surgeon: Larey Dresser, MD;  Location: [facility];  Service: Cardiovascular;  Laterality: N/A;    OB History     Gravida  4   Para  2   Term  2   Preterm      AB  2   Living  2      SAB  2   IAB      Ectopic      Multiple      Live Births  2            Home Medications    Prior to Admission medications   Medication Sig Start Date End Date Taking? Authorizing Provider  ALPRAZolam (XANAX) 0.5 MG tablet TAKE 1 TABLET(0.5 MG) BY MOUTH TWICE DAILY AS NEEDED FOR ANXIETY 05/21/22   Luking, Elayne Snare, MD  cephALEXin (KEFLEX) 500 MG capsule Take 1  capsule (500 mg total) by mouth 4 (four) times daily. 06/16/22  Yes Hazel Sams, PA-C  acetaminophen (TYLENOL) 500 MG tablet Take 1,000 mg by mouth daily as needed for headache.     [provider]  aspirin (ASPIRIN LOW DOSE) 81 MG EC tablet TAKE 1 TABLET (81 MG TOTAL) BY MOUTH DAILY. SWALLOW WHOLE. 07/27/21   [name], MD  BIOTIN PO Take 1 tablet by mouth daily.     [provider]  cefPROZIL (CEFZIL) 500 MG tablet Take one tablet po BID for 10 days 05/20/22   [name], MD  EPINEPHrine 0.3 mg/0.3 mL IJ SOAJ injection Inject 0.3 mg into the muscle once as needed for up to 1 dose for anaphylaxis. 09/14/21   Althea Charon, FNP  famotidine (PEPCID) 20 MG tablet Take 1 tablet (20 mg  total) by mouth 2 (two) times daily as needed for heartburn or indigestion. 10/12/21   Gatha Mayer, MD  loratadine (CLARITIN) 10 MG tablet Take 10 mg by mouth daily.    [provider]  minocycline (MINOCIN) 50 MG capsule Take 1 capsule (50 mg total) by mouth 2 (two) times daily. 02/07/22   Sheffield, Ronalee Red, PA-C  Polyvinyl Alcohol-Povidone (REFRESH OP) Place 1 drop into both eyes daily as needed (dry eyes).    [provider]  Rimegepant Sulfate (NURTEC) 75 MG TBDP Take 1 tablet (75 mg) by mouth at first sign of migraine. Do not take more than 1 every 24 hours. 06/26/21   [name], MD  tacrolimus (PROTOPIC) 0.1 % ointment Apply topically at bedtime. 07/19/21   Warren Danes, PA-C  triamcinolone cream (KENALOG) 0.1 % Apply time amount twice daily to affected areas over the course of next 5-7 days. Do not use on facial area 10/12/21   [name], MD    Family History Family History  Problem Relation Age of Onset   Diabetes Other    Lung cancer Paternal Grandfather    Diabetes Maternal Grandmother    COPD Maternal Grandfather    Heart disease Paternal Grandmother    Colon polyps Paternal Grandmother    Irritable bowel syndrome Paternal Grandmother    ADD / ADHD Daughter    Anxiety disorder Daughter    Allergic rhinitis Neg Hx    Angioedema Neg Hx    Asthma Neg Hx    Atopy Neg Hx    Eczema Neg Hx    Immunodeficiency Neg Hx    Urticaria Neg Hx    Colon cancer Neg Hx    Esophageal cancer Neg Hx    Pancreatic cancer Neg Hx    Liver disease Neg Hx     Social History Social History   Tobacco Use   Smoking status: Never   Smokeless tobacco: Never  Vaping Use   Vaping Use: Never used  Substance Use Topics   Alcohol use: Not Currently    Comment: social   Drug use: No     Allergies   Patient has no known allergies.   Review of Systems Review of Systems  Skin:  Positive for wound.  All other systems reviewed and are  negative.    Physical Exam Triage Vital Signs ED Triage Vitals  Enc Vitals Group     BP 06/16/22 0834 130/84     Pulse Rate 06/16/22 0834 (!) 106     Resp 06/16/22 0834 18     Temp 06/16/22 0834 98.2 F (36.8 C)  Temp Source 06/16/22 0834 Oral     SpO2 06/16/22 0834 98 %     Weight --      Height --      Head Circumference --      Peak Flow --      Pain Score 06/16/22 0835 0     Pain Loc --      Pain Edu? --      Excl. in [place]? --    No data found.  Updated Vital Signs BP 130/84 (BP Location: Right Arm)   Pulse (!) 106   Temp 98.2 F (36.8 C) (Oral)   Resp 18   LMP 06/16/2022 (Approximate)   SpO2 98%   Visual Acuity Right Eye Distance:   Left Eye Distance:   Bilateral Distance:    Right Eye Near:   Left Eye Near:    Bilateral Near:     Physical Exam Vitals reviewed.  Constitutional:      General: She is not in acute distress.    Appearance: Normal appearance. She is not ill-appearing.  HENT:     Head: Normocephalic and atraumatic.  Pulmonary:     Effort: Pulmonary effort is normal.  Skin:    Comments: See image below R foot - ball of foot with small 38m splinter. Removed using tweezers.   Neurological:     General: No focal deficit present.     Mental Status: She is alert and oriented to person, place, and time.  Psychiatric:        Mood and Affect: Mood normal.        Behavior: Behavior normal.        Thought Content: Thought content normal.        Judgment: Judgment normal.       UC Treatments / Results  Labs (all labs ordered are listed, but only abnormal results are displayed) Labs Reviewed - No data to display  EKG   Radiology No results found.  Procedures Procedures (including critical care time)  Medications Ordered in UC Medications - No data to display  Initial Impression / Assessment and Plan / UC Course  I have reviewed the triage vital signs and the nursing notes.  Pertinent labs & imaging results that were  available during my care of the patient were reviewed by me and considered in my medical decision making (see chart for details).     This patient is a very pleasant 36y.o. year old female presenting with splinter R foot. Completely removed using tweezers, no bleeding. Patient tolerated well. Keflex sent to have on hand as she is going [place] the beach. Advised to keep the wound covered while walking and doing activities that could get it dirty. ED return precautions discussed. Patient verbalizes understanding and agreement. She works in healthcare- respiratory therapist.  Final Clinical Impressions(s) / UC Diagnoses   Final diagnoses:  Splinter of left foot, initial encounter     Discharge Instructions      -If redness, swelling, drainage - -Start the antibiotic: Keflex, 4x daily x5 days. You can take this with food if you have a sensitive stomach. -Wash with gentle soap and water -Keep covered while walking or getting feet dirty  -Soak feet twice daily    ED Prescriptions     Medication Sig Dispense Auth. Provider   cephALEXin (KEFLEX) 500 MG capsule Take 1 capsule (500 mg total) by mouth 4 (four) times daily. 20 capsule GLeonia Reader     PDMP  not reviewed this encounter.   Hazel Sams, PA-C 06/16/22 [phone]

## 2022-06-16 NOTE — Telephone Encounter (Signed)
Nurses I read over Kelsay's note Certainly sympathetic to what is going on I do think is a good idea for her to follow through with seeing Dr.Teoh by August.  Perhaps she can get on a cancellation list if there is a cancellation she could be seen sooner.  I doubt that the lymph nodes are associated with the issues with the lips but I cannot know for certain.  If she has further troubles with the lips she can send [place] pictures via MyChart for further review  May or may not will need referral to additional specialist for that issue  Thanks-Dr. Nicki Reaper

## 2022-06-26 NOTE — Telephone Encounter (Signed)
Nurses I read through her note We are more than willing to see her.  Tomorrow I believe I have a same-day slot available in the morning I also have a few on Monday and then if you later in the week next week Please assist her in getting scheduled

## 2022-07-03 NOTE — Telephone Encounter (Signed)
Nurses-please let Lynnox know-I am glad she was able to be seen by Dr.Teoh.  I think it would be fine to follow through with follow-up with them.  If we can be of further help let us know. TakeCare-Dr. Nicki Reaper

## 2022-07-05 ENCOUNTER — Ambulatory Visit (INDEPENDENT_AMBULATORY_CARE_PROVIDER_SITE_OTHER): Payer: No Typology Code available for payment source | Admitting: Family Medicine

## 2022-07-05 ENCOUNTER — Encounter: Payer: Self-pay | Admitting: Family Medicine

## 2022-07-05 VITALS — BP 130/76 | Wt 161.0 lb

## 2022-07-05 DIAGNOSIS — M898X1 Other specified disorders of bone, shoulder: Secondary | ICD-10-CM | POA: Diagnosis not present

## 2022-07-05 NOTE — Progress Notes (Unsigned)
   Subjective:    Patient ID: Marissa Burnett, female    DOB: February 28, 1986, 36 y.o.   MRN: [id number]  HPI Pt arrives for knot on right shoulder. Pt noticed knot on Tuesday night. Pt nephew jumped on shoulder Tuesday. No bruise but is hard. Pt states if she messes or presses on knot area to much she feels tingling/weakness in arm/hand area.  She does have a history of stroke   Review of Systems     Objective:   Physical Exam No tenderness or pain or discomfort Prominent lateral aspect to her clavicle that she has not noticed before No findings like that on the left side        Assessment & Plan:  More than likely this is calcium buildup at the [facility] joint X-rays ordered If x-rays are abnormal will need CAT scan But if x-rays are normal that would be reassuring that this is a calcium buildup and does not need further intervention I do not feel that this is related to the numbness going down her arm I think that is from her impingement of the nerve in the neck and if that gets worse we will need to help set up with neurosurgery

## 2022-07-08 ENCOUNTER — Ambulatory Visit (HOSPITAL_BASED_OUTPATIENT_CLINIC_OR_DEPARTMENT_OTHER)
Admission: RE | Admit: 2022-07-08 | Discharge: 2022-07-08 | Disposition: A | Discharge status: Home / Self Care | Payer: No Typology Code available for payment source | Source: Ambulatory Visit | Attending: Family Medicine | Admitting: Family Medicine

## 2022-07-08 DIAGNOSIS — M898X1 Other specified disorders of bone, shoulder: Secondary | ICD-10-CM | POA: Insufficient documentation

## 2022-07-09 ENCOUNTER — Encounter: Payer: Self-pay | Admitting: Family Medicine

## 2022-07-09 ENCOUNTER — Telehealth: Payer: Self-pay

## 2022-07-09 NOTE — Telephone Encounter (Signed)
We received a call from this patient regarding rescheduling her cancelled NCV/EMG appointment. She was originally called to r/s d/t our tech leaving, but we were never able to get her rescheduled. She is now wanting to see if she is able to schedule with [place] or if she can be referred out for the testing as the numbness is coming back/worsening

## 2022-07-18 ENCOUNTER — Ambulatory Visit: Payer: No Typology Code available for payment source | Admitting: Physician Assistant

## 2022-07-30 ENCOUNTER — Other Ambulatory Visit (HOSPITAL_BASED_OUTPATIENT_CLINIC_OR_DEPARTMENT_OTHER): Payer: Self-pay

## 2022-07-30 DIAGNOSIS — J029 Acute pharyngitis, unspecified: Secondary | ICD-10-CM | POA: Insufficient documentation

## 2022-07-30 MED ORDER — AMOXICILLIN 500 MG PO CAPS
500.0000 mg | ORAL_CAPSULE | Freq: Two times a day (BID) | ORAL | 0 refills | Status: DC
  Filled 2022-07-30: qty 20, 10d supply, fill #0

## 2022-07-31 ENCOUNTER — Ambulatory Visit: Payer: No Typology Code available for payment source | Admitting: Physician Assistant

## 2022-08-02 ENCOUNTER — Ambulatory Visit: Payer: Self-pay | Admitting: Family Medicine

## 2022-08-21 ENCOUNTER — Ambulatory Visit (INDEPENDENT_AMBULATORY_CARE_PROVIDER_SITE_OTHER): Payer: No Typology Code available for payment source | Admitting: Internal Medicine

## 2022-08-21 ENCOUNTER — Encounter: Payer: Self-pay | Admitting: Internal Medicine

## 2022-08-21 VITALS — BP 110/74 | HR 103 | Ht 64.0 in | Wt 161.0 lb

## 2022-08-21 DIAGNOSIS — K219 Gastro-esophageal reflux disease without esophagitis: Secondary | ICD-10-CM | POA: Diagnosis not present

## 2022-08-21 NOTE — Patient Instructions (Signed)
_______________________________________________________  If you are age 37 or older, your body mass index should be between 23-30. Your Body mass index is 27.64 kg/m. If this is out of the aforementioned range listed, please consider follow up with your Primary Care Provider.  If you are age 10 or younger, your body mass index should be between 19-25. Your Body mass index is 27.64 kg/m. If this is out of the aformentioned range listed, please consider follow up with your Primary Care Provider.   ________________________________________________________  The [place] providers would like to encourage you to use [facility] to communicate with providers for non-urgent requests or questions.  Due to long hold times on the telephone, sending your provider a message by [facility] may be a faster and more efficient way to get a response.  Please allow 48 business hours for a response.  Please remember that this is for non-urgent requests.  _______________________________________________________   Continue your PPI.   I appreciate the opportunity to care for you. Silvano Rusk, MD, [facility]

## 2022-08-21 NOTE — Progress Notes (Addendum)
Marissa Burnett 36 y.o. 1986-07-15 [id number]  Assessment & Plan:   Encounter Diagnoses  Name Primary?   Gastroesophageal reflux disease, unspecified whether esophagitis present Yes   Laryngopharyngeal reflux (LPR)    She has GERD and presumably LPR at least signs that could be consistent with it on laryngoscopy.  I explained how studies have shown that erythema and edema in the posterior pharynx laryngeal area are often from GERD but not necessarily so.  With respect to EGD it would be reasonable to pursue 1 but I do not think it is absolutely indicated.  She is not inclined to do so.  We reviewed the more absolute indications such as dysphagia, bleeding/anemia, unintentional weight loss.  She has responded to PPI therapy with respect to her heartburn and reflux symptoms and will continue on that and see me as needed.  We had previously discussed the availability of TIF procedure and how that would require an EGD.  She is happy with medical therapy at this time.  CC: Marissa Drown, MD Dr. Leta Burnett  Subjective:   Chief Complaint: GERD  HPI 36 year old woman with a history of GERD since the age of 75, migraine headaches, stroke and ASD who presents for follow-up after [name] suggested she return to GI due to signs of LPR suspected on laryngoscopy.  She has had some lymphadenopathy issues seen by primary care then saw ENT and those are being monitored.  Nobody thought biopsy or imaging beyond the ultrasound she had was necessary.  The lymph nodes are decreasing in size.  As part of her evaluation at ENT she had a laryngoscopy and was told she had erythema and "swelling" in there.  [name] recommended she go back on PPI therapy.  When I had seen her last year she wanted to try to come off of PPI which she did fairly successfully and used some intermittent Pepcid.  Since going on omeprazole 20 mg daily she does have less heartburn and feels overall better.  There never were any persistent  sore throat or hoarseness issues.  She has had a prior laryngoscopy as well and [name] said the most recent one was very similar to that 1.  He recommended she come see me and consider endoscopy.  She is not inclined to have an endoscopy.  Though she has had GERD since the age of 47 she does not have dysphagia and has not had bleeding or anemia or unintentional weight loss issues.  She denies any abdominal pain.  She has been wondering if some perioral dermatitis issue she has could be related to reflux or gut disturbance.  She treats that with tacrolimus ointment when it flares.  She has been seen in dermatology. No Known Allergies Current Meds  Medication Sig   acetaminophen (TYLENOL) 500 MG tablet Take 1,000 mg by mouth daily as needed for headache.    ALPRAZolam (XANAX) 0.5 MG tablet TAKE 1 TABLET(0.5 MG) BY MOUTH TWICE DAILY AS NEEDED FOR ANXIETY   aspirin (ASPIRIN LOW DOSE) 81 MG EC tablet TAKE 1 TABLET (81 MG TOTAL) BY MOUTH DAILY. SWALLOW WHOLE.   BIOTIN PO Take 1 tablet by mouth daily.    EPINEPHrine 0.3 mg/0.3 mL IJ SOAJ injection Inject 0.3 mg into the muscle once as needed for up to 1 dose for anaphylaxis.   loratadine (CLARITIN) 10 MG tablet Take 10 mg by mouth daily.   Omeprazole 20 MG TBDD Take 1 capsule by mouth daily.   Polyvinyl  Alcohol-Povidone (REFRESH OP) Place 1 drop into both eyes daily as needed (dry eyes).   Rimegepant Sulfate (NURTEC) 75 MG TBDP Take 1 tablet (75 mg) by mouth at first sign of migraine. Do not take more than 1 every 24 hours.   tacrolimus (PROTOPIC) 0.1 % ointment Apply topically at bedtime.   Past Medical History:  Diagnosis Date   Abnormal Papanicolaou smear of cervix 10/15/2016   LSIL+HPV   Allergy    Anxiety    ASD (atrial septal defect) 07/28/2020   Chronic lumbar pain 09/27/2018   Patient has seen Dr. Arnoldo Morale neurosurgery September 2019-they do not feel there is anything surgical that can help-they are recommending injections with pain medicine  specialist   COVID    Gallstones    GERD (gastroesophageal reflux disease)    Heart murmur birth   Migraine    Stroke ([place]) 07/12/2020   a. L MCA stroke in 06/2020.   TIA (transient ischemic attack)    Past Surgical History:  Procedure Laterality Date   ABDOMINOPLASTY     ATRIAL SEPTAL DEFECT(ASD) CLOSURE N/A 08/30/2020   Procedure: ATRIAL SEPTAL DEFECT (ASD) CLOSURE;  Surgeon: Sherren Mocha, MD;  Location: [facility];  Service: Cardiovascular;  Laterality: N/A;   BREAST ENHANCEMENT SURGERY     CERVICAL CONIZATION W/BX N/A 12/13/2016   Procedure: CONIZATION CERVIX WITH BIOPSY--cold knife conization;  Surgeon: Jonnie Kind, MD;  Location: [facility];  Service: Gynecology;  Laterality: N/A;   CHOLECYSTECTOMY  02/2002   ENDOSCOPIC RETROGRADE CHOLANGIOPANCREATOGRAPHY (ERCP) WITH PROPOFOL     OTHER SURGICAL HISTORY     stent in kidney during pregnancy,2002; removed in 2003   TEE WITHOUT CARDIOVERSION N/A 08/04/2020   Procedure: TRANSESOPHAGEAL ECHOCARDIOGRAM (TEE);  Surgeon: Larey Dresser, MD;  Location: [facility] END[facility];  Service: Cardiovascular;  Laterality: N/A;   Social History   Social History Narrative   Married, 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   Review of Systems As above  Objective:   Physical Exam BP 110/74   Pulse (!) 103   Ht '5\' 4"'$  (1.626 m)   Wt 161 lb (73 kg)   BMI 27.64 kg/m

## 2022-09-03 ENCOUNTER — Telehealth: Payer: Self-pay | Admitting: Cardiovascular Disease

## 2022-09-03 NOTE — Telephone Encounter (Signed)
   Pre-operative Risk Assessment    Patient Name: [name]  DOB: 07-06-1986 MRN: [id number]      Request for Surgical Clearance    Procedure:   Sclero Therapy   Date of Surgery:  Clearance 11/01/22                                 Surgeon:  Dr. Juliann Mule Group or Practice Name:  [facility]  Phone number:  [phone] Fax number:  [phone]   Type of Clearance Requested:   - Medical  - Pharmacy:  Hold Aspirin 2 days   Type of Anesthesia:  None    Additional requests/questions:      SignedMilbert Coulter   09/03/2022, 12:53 PM

## 2022-09-04 NOTE — Telephone Encounter (Signed)
Dr. Burt Knack, would you please advise regarding holding aspirin for 2 days for upcoming slero therapy? Guidelines are not as clear in regards to history of stroke ASD closure so I would value your input.  Thank you, Emmaline Life, NP-C     09/04/2022, 8:40 AM [address], [address] Office [phone] Fax [phone]

## 2022-09-04 NOTE — Telephone Encounter (Signed)
Holding ASA 2 days is low-risk/acceptable. thx

## 2022-09-04 NOTE — Telephone Encounter (Signed)
   Primary Cardiologist: Sherren Mocha, MD  Chart reviewed as part of pre-operative protocol coverage. Given past medical history and time since last visit, based on ACC/AHA guidelines, Marissa Burnett would be at acceptable risk for the planned procedure without further cardiovascular testing.   She may hold aspirin for 2 days prior to procedure and should resume as soon as possible post-procedure.   I will route this recommendation to the requesting party via Epic fax function and remove from pre-op pool.  Please call with questions.  Emmaline Life, NP-C     09/04/2022, 11:52 AM [address], [address] Office [phone] Fax [phone]

## 2022-09-25 ENCOUNTER — Encounter: Payer: No Typology Code available for payment source | Admitting: Neurology

## 2022-10-05 ENCOUNTER — Other Ambulatory Visit: Payer: Self-pay | Admitting: Family Medicine

## 2022-10-08 ENCOUNTER — Other Ambulatory Visit: Payer: Self-pay | Admitting: Family Medicine

## 2022-10-08 MED ORDER — ALPRAZOLAM 0.5 MG PO TABS: ORAL_TABLET | ORAL | 1 refills | Status: DC

## 2022-10-09 ENCOUNTER — Encounter: Payer: No Typology Code available for payment source | Admitting: Neurology

## 2022-10-09 ENCOUNTER — Other Ambulatory Visit (HOSPITAL_BASED_OUTPATIENT_CLINIC_OR_DEPARTMENT_OTHER): Payer: Self-pay

## 2022-10-09 MED ORDER — OMEPRAZOLE 20 MG PO CPDR
20.0000 mg | DELAYED_RELEASE_CAPSULE | Freq: Every day | ORAL | 0 refills | Status: DC
  Filled 2022-10-09: qty 30, 30d supply, fill #0

## 2022-10-11 ENCOUNTER — Encounter: Payer: Self-pay | Admitting: Neurology

## 2022-10-11 ENCOUNTER — Ambulatory Visit (INDEPENDENT_AMBULATORY_CARE_PROVIDER_SITE_OTHER): Payer: No Typology Code available for payment source | Admitting: Neurology

## 2022-10-11 ENCOUNTER — Ambulatory Visit: Payer: No Typology Code available for payment source | Admitting: Neurology

## 2022-10-11 VITALS — BP 146/91 | HR 104 | Ht 64.0 in | Wt 161.0 lb

## 2022-10-11 DIAGNOSIS — Z8673 Personal history of transient ischemic attack (TIA), and cerebral infarction without residual deficits: Secondary | ICD-10-CM | POA: Diagnosis not present

## 2022-10-11 DIAGNOSIS — Q2112 Patent foramen ovale: Secondary | ICD-10-CM | POA: Diagnosis not present

## 2022-10-11 DIAGNOSIS — R202 Paresthesia of skin: Secondary | ICD-10-CM | POA: Diagnosis not present

## 2022-10-11 NOTE — Progress Notes (Incomplete)
ASSESSMENT AND PLAN  Marissa Burnett is a 36 y.o. female   Hx of embolic stroke due to atrial septal defect, s/p endovascular closure  On asa '81mg'$  daily Chronic migraine with visual aura occasionally.  NSAIDs, nurtec prn New onset intermittent bilateral hands paresthesia  EMG conduction study showed normal carpal tunnel syndromes, no large fiber peripheral neuropathy     DIAGNOSTIC DATA (LABS, IMAGING, TESTING) - I reviewed patient records, labs, notes, testing and imaging myself where available.   MEDICAL HISTORY:  Marissa Burnett, is a 36 year old right-handed female seen in request by Dr. Leonie Man for  electrodiagnostic study of bilateral upper extremity paresthesia, her primary care physician is Dr. Wolfgang Phoenix, [facility] Snare,    I reviewed and summarized the referring note. 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  November 2022, no acute abnormality  MRI cervical mild degenerative changes, no significant canal and foraminal narrowing    PHYSICAL EXAM:    PHYSICAL EXAMNIATION: BP 146/91, HR 104  Gen: NAD, conversant, well nourised, well groomed                     Cardiovascular: Regular rate rhythm, no peripheral edema, warm, nontender. Eyes: Conjunctivae clear without exudates or hemorrhage Neck: Supple, no carotid bruits. Pulmonary: Clear to  auscultation bilaterally   NEUROLOGICAL EXAM:  MENTAL STATUS: Speech/cognition: Awake, alert, oriented to history taking and casual conversation CRANIAL NERVES: CN II: Visual fields are full to confrontation. Pupils are round equal and briskly reactive to light. CN III, IV, VI: extraocular movement are normal. No ptosis. CN V: Facial sensation is intact to light touch CN VII: Face is symmetric with normal eye closure  CN VIII: Hearing is normal to causal conversation. CN IX, X: Phonation is normal. CN XI: Head turning and shoulder shrug are intact  MOTOR: There is no pronator drift of out-stretched arms. Muscle bulk and tone are normal. Muscle strength is normal.  REFLEXES: Reflexes are 2+ and symmetric at the biceps, triceps, knees, and ankles. Plantar responses are flexor.  SENSORY: mild decreased pin prick at the first 3 finger pad.  COORDINATION: There is no trunk or limb dysmetria noted.  GAIT/STANCE: Posture is normal. Gait is steady with normal steps, base, arm swing, and turning. Heel and toe walking are normal. Tandem gait is normal.  Romberg is absent.  REVIEW OF SYSTEMS:  Full 14 system review of systems performed and notable only for as above All other review of systems were negative.   ALLERGIES: No Known Allergies  HOME MEDICATIONS: Current Outpatient Medications  Medication Sig Dispense Refill   acetaminophen (TYLENOL) 500 MG tablet Take 1,000 mg by mouth daily as needed for headache.      ALPRAZolam (XANAX) 0.5 MG tablet TAKE 1 TABLET(0.5 MG) BY MOUTH TWICE DAILY AS NEEDED FOR ANXIETY 30 tablet 1   aspirin (ASPIRIN LOW DOSE) 81 MG EC tablet TAKE  1 TABLET (81 MG TOTAL) BY MOUTH DAILY. SWALLOW WHOLE. 30 tablet 11   BIOTIN PO Take 1 tablet by mouth daily.      EPINEPHrine 0.3 mg/0.3 mL IJ SOAJ injection Inject 0.3 mg into the muscle once as needed for up to 1 dose for anaphylaxis. 2 each 1   famotidine (PEPCID) 20 MG tablet Take 1 tablet (20 mg total) by  mouth 2 (two) times daily as needed for heartburn or indigestion. (Patient not taking: Reported on 08/21/2022) 90 tablet 3   loratadine (CLARITIN) 10 MG tablet Take 10 mg by mouth daily.     minocycline (MINOCIN) 50 MG capsule Take 1 capsule (50 mg total) by mouth 2 (two) times daily. (Patient not taking: Reported on 08/21/2022) 60 capsule 3   omeprazole (PRILOSEC) 20 MG capsule Take 1 capsule (20 mg total) by mouth daily. 30 capsule 0   Polyvinyl Alcohol-Povidone (REFRESH OP) Place 1 drop into both eyes daily as needed (dry eyes).     Rimegepant Sulfate (NURTEC) 75 MG TBDP Take 1 tablet (75 mg) by mouth at first sign of migraine. Do not take more than 1 every 24 hours. 8 tablet 4   tacrolimus (PROTOPIC) 0.1 % ointment Apply topically at bedtime. 90 g 3   triamcinolone cream (KENALOG) 0.1 % Apply time amount twice daily to affected areas over the course of next 5-7 days. Do not use on facial area (Patient not taking: Reported on 08/21/2022) 30 g 02   No current facility-administered medications for this visit.   Facility-Administered Medications Ordered in Other Visits  Medication Dose Route Frequency Provider Last Rate Last Admin   sodium chloride flush (NS) 0.9 % injection 10 mL  10 mL Intravenous PRN Sherren Mocha, MD        PAST MEDICAL HISTORY: Past Medical History:  Diagnosis Date   Abnormal Papanicolaou smear of cervix 10/15/2016   LSIL+HPV   Allergy    Anxiety    ASD (atrial septal defect) 07/28/2020   Chronic lumbar pain 09/27/2018   Patient has seen Dr. Arnoldo Morale neurosurgery September 2019-they do not feel there is anything surgical that can help-they are recommending injections with pain medicine specialist   COVID    Gallstones    GERD (gastroesophageal reflux disease)    Heart murmur birth   Migraine    Stroke ([place]) 07/12/2020   a. L MCA stroke in 06/2020.   TIA (transient ischemic attack)     PAST SURGICAL HISTORY: Past Surgical History:  Procedure Laterality Date    ABDOMINOPLASTY     ATRIAL SEPTAL DEFECT(ASD) CLOSURE N/A 08/30/2020   Procedure: ATRIAL SEPTAL DEFECT (ASD) CLOSURE;  Surgeon: Sherren Mocha, MD;  Location: [facility];  Service: Cardiovascular;  Laterality: N/A;   BREAST ENHANCEMENT SURGERY     CERVICAL CONIZATION W/BX N/A 12/13/2016   Procedure: CONIZATION CERVIX WITH BIOPSY--cold knife conization;  Surgeon: Jonnie Kind, MD;  Location: [facility];  Service: Gynecology;  Laterality: N/A;   CHOLECYSTECTOMY  02/2002   ENDOSCOPIC RETROGRADE CHOLANGIOPANCREATOGRAPHY (ERCP) WITH PROPOFOL     OTHER SURGICAL HISTORY     stent in kidney during pregnancy,2002; removed in 2003   TEE WITHOUT CARDIOVERSION N/A 08/04/2020   Procedure: TRANSESOPHAGEAL ECHOCARDIOGRAM (TEE);  Surgeon: Larey Dresser, MD;  Location: [facility] ENDOSCOPY;  Service: Cardiovascular;  Laterality: N/A;    FAMILY HISTORY: Family History  Problem Relation Age of Onset   Diabetes Other    Lung cancer Paternal Grandfather    Diabetes Maternal Grandmother  COPD Maternal Grandfather    Heart disease Paternal Grandmother    Colon polyps Paternal Grandmother    Irritable bowel syndrome Paternal Grandmother    ADD / ADHD Daughter    Anxiety disorder Daughter    Allergic rhinitis Neg Hx    Angioedema Neg Hx    Asthma Neg Hx    Atopy Neg Hx    Eczema Neg Hx    Immunodeficiency Neg Hx    Urticaria Neg Hx    Colon cancer Neg Hx    Esophageal cancer Neg Hx    Pancreatic cancer Neg Hx    Liver disease Neg Hx     SOCIAL HISTORY: Social History   Socioeconomic History   Marital status: Married    Spouse name: Not on file   Number of children: 2   Years of education: Not on file   Highest education level: Not on file  Occupational History   Occupation: resp therapist    Employer: [employer]  Tobacco Use   Smoking status: Never   Smokeless tobacco: Never  Vaping Use   Vaping Use: Never used  Substance and Sexual Activity   Alcohol use: Not Currently    Comment:  social   Drug use: No   Sexual activity: Yes    Birth control/protection: None  Other Topics Concern   Not on file  Social History Narrative   Married, 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 Ph.D.  [facility] [address] [place], [place] [address] Ph: [phone] Fax: [phone]  CC:  [name], MD [place],  [place] [address]  [name], MD

## 2022-10-12 NOTE — Procedures (Signed)
Full Name: Marissa Burnett Gender: Female MRN #: [id number] Date of Birth: [birth year]    Visit Date: 10/11/2022 11:33 Age: 36 Years Examining Physician: Marcial Pacas, MD Referring Physician: Dr. Leonie Man Height: 5 feet 4 inch History: 36 year old female with history of embolic stroke due to atrial septal defect, presenting with intermittent bilateral hands and foot paresthesia  Summary of the test:  Nerve conduction study: Right sural, superficial peroneal sensory responses were normal.  Right tibial, peroneal to EDB motor responses were normal  Bilateral ulnar and medial sensory responses were normal  Bilateral median and right ulnar motor responses were normal.  Electromyography: Selected needle examinations of right upper extremity muscles were normal.    Conclusion: This is a normal study.  There is no electrodiagnostic evidence of bilateral carpal tunnel syndromes or large fiber peripheral neuropathy   ------------------------------- Marcial Pacas, M.D. PhD [facility] [address] [place], [place] [address] Tel: [phone] Fax: [phone]  Verbal informed consent was obtained from the patient, patient was informed of potential risk of procedure, including bruising, bleeding, hematoma formation, infection, muscle weakness, muscle pain, numbness, among others.        [place]    Nerve / Sites Muscle Latency Ref. Amplitude Ref. Rel Amp Segments Distance Velocity Ref. Area    ms ms mV mV %  cm m/s m/s mVms  R Median - APB     Wrist APB 4.0 ?4.4 10.4 ?4.0 100 Wrist - APB 7   38.1     Upper arm APB 7.4  6.2  59.4 Upper arm - Wrist 24 72 ?49 27.0  L Median - APB     Wrist APB 3.2 ?4.4 9.3 ?4.0 100 Wrist - APB 7   31.2     Upper arm APB 6.5  6.0  64.7 Upper arm - Wrist 18 55 ?49 24.1  R Ulnar - ADM     Wrist ADM 2.6 ?3.3 9.4 ?6.0 100 Wrist - ADM 7   33.5     B.Elbow ADM 4.5  8.3  87.8 B.Elbow - Wrist 14 74 ?49 32.0     A.Elbow ADM 7.1  8.1   97.8 A.Elbow - B.Elbow 10 38 ?49 32.9  R Peroneal - EDB     Ankle EDB 4.1 ?6.5 3.5 ?2.0 100 Ankle - EDB 9   10.6     Fib head EDB 10.3  3.5  99.1 Fib head - Ankle 28 46 ?44 11.5     Pop fossa EDB 14.5  2.6  76.2 Pop fossa - Fib head 13 31 ?44 8.5         Pop fossa - Ankle      R Tibial - AH     Ankle AH 3.8 ?5.8 15.2 ?4.0 100 Ankle - AH 9   34.9     Pop fossa AH 13.5  13.5  88.9 Pop fossa - Ankle 38 39 ?41 31.6               SNC    Nerve / Sites Rec. Site Peak Lat Ref.  Amp Ref. Segments Distance    ms ms V V  cm  R Sural - Ankle (Calf)     Calf Ankle 3.9 ?4.4 7 ?6 Calf - Ankle 14  R Superficial peroneal - Ankle     Lat leg Ankle 3.6 ?4.4 16 ?6 Lat leg - Ankle 14  R Median - Orthodromic (Dig II, Mid palm)  Dig II Wrist 2.9 ?3.4 14 ?10 Dig II - Wrist 13  L Median - Orthodromic (Dig II, Mid palm)     Dig II Wrist 2.7 ?3.4 26 ?10 Dig II - Wrist 13  R Ulnar - Orthodromic, (Dig V, Mid palm)     Dig V Wrist 2.4 ?3.1 9 ?5 Dig V - Wrist 11  L Ulnar - Orthodromic, (Dig V, Mid palm)     Dig V Wrist 2.5 ?3.1 12 ?5 Dig V - Wrist 46                 F  Wave    Nerve F Lat Ref.   ms ms  R Ulnar - ADM 24.8 ?32.0  R Tibial - AH 48.3 ?56.0         EMG Summary Table    Spontaneous MUAP Recruitment  Muscle IA Fib PSW Fasc Other Amp Dur. Poly Pattern  R. First dorsal interosseous Normal None None None _______ Normal Normal Normal Normal  R. Brachioradialis Normal None None None _______ Normal Normal Normal Normal  R. Pronator teres Normal None None None _______ Normal Normal Normal Normal  R. Biceps brachii Normal None None None _______ Normal Normal Normal Normal  R. Deltoid Normal None None None _______ Normal Normal Normal Normal  R. Triceps brachii Normal None None None _______ Normal Normal Normal Normal

## 2022-10-12 NOTE — Progress Notes (Signed)
EMG report is under procedure 

## 2022-10-23 NOTE — Progress Notes (Signed)
Kindly inform the patient that nerve conduction study was normal without evidence of any pinched nerve.

## 2022-10-24 ENCOUNTER — Other Ambulatory Visit (HOSPITAL_COMMUNITY): Payer: Self-pay

## 2022-10-24 MED ORDER — TRETINOIN 0.025 % EX CREA
TOPICAL_CREAM | CUTANEOUS | 3 refills | Status: AC
  Filled 2022-10-24: qty 45, 30d supply, fill #0
  Filled 2022-11-18: qty 45, 30d supply, fill #1

## 2022-10-29 ENCOUNTER — Ambulatory Visit (INDEPENDENT_AMBULATORY_CARE_PROVIDER_SITE_OTHER): Payer: Self-pay

## 2022-10-29 DIAGNOSIS — I8393 Asymptomatic varicose veins of bilateral lower extremities: Secondary | ICD-10-CM

## 2022-10-29 NOTE — Progress Notes (Signed)
Treated pt's LUE reticular veins with Asclera 1%, administered with a 27g butterfly.  Patient received a total of 4 mL. Pt tolerated very well. We had treated some of this same area in the past and she experienced a good outcome but felt it returned in the recent months. MD came in to assess pt prior to starting. We explained this is unlikely to get complete and permanent resolution of the reticular vein. She verbalized understanding and wished to proceed with treatment. She will follow up as needed. Post treatment care instructions given on both handout and verbally.   Photos: Yes.    Compression stockings applied: Yes.

## 2022-11-01 ENCOUNTER — Ambulatory Visit: Payer: No Typology Code available for payment source

## 2022-11-05 ENCOUNTER — Encounter: Payer: Self-pay | Admitting: Family Medicine

## 2022-11-05 ENCOUNTER — Ambulatory Visit (INDEPENDENT_AMBULATORY_CARE_PROVIDER_SITE_OTHER): Payer: No Typology Code available for payment source | Admitting: Family Medicine

## 2022-11-05 DIAGNOSIS — B349 Viral infection, unspecified: Secondary | ICD-10-CM | POA: Diagnosis not present

## 2022-11-05 NOTE — Progress Notes (Signed)
   Subjective:    Patient ID: Marissa Burnett, female    DOB: 1986/06/27, 36 y.o.   MRN: [id number]  HPI Viral-like illness for several days with sore throat runny nose sinus pressure coughing also some wheezing has been around positive RSV case denies high fever wheezing or difficulty breathing currently   Review of Systems     Objective:   Physical Exam Eardrums are normal throat normal neck no masses lymph node noted on the left side she states she gets this when she gets infections this is less than 1 cm Lungs are clear respiratory rate normal       Assessment & Plan:  Viral-like illness Triple swab taken Await the results Follow-up if progressive troubles Warning signs discussed

## 2022-11-06 LAB — COVID-19, FLU A+B AND RSV
Influenza A, NAA: NOT DETECTED
Influenza B, NAA: NOT DETECTED
RSV, NAA: NOT DETECTED
SARS-CoV-2, NAA: NOT DETECTED

## 2022-11-07 ENCOUNTER — Other Ambulatory Visit: Payer: Self-pay | Admitting: Family Medicine

## 2022-11-07 ENCOUNTER — Other Ambulatory Visit (HOSPITAL_COMMUNITY): Payer: Self-pay

## 2022-11-07 MED ORDER — OMEPRAZOLE 20 MG PO CPDR
20.0000 mg | DELAYED_RELEASE_CAPSULE | Freq: Every day | ORAL | 0 refills | Status: DC
  Filled 2022-11-07 – 2022-11-12 (×2): qty 30, 30d supply, fill #0

## 2022-11-11 ENCOUNTER — Encounter: Payer: Self-pay | Admitting: Family Medicine

## 2022-11-11 NOTE — Telephone Encounter (Signed)
Nurses It would be fine at this point to go ahead and send in prescription for amoxicillin 500 mg 1 taken 3 times daily for 7 days  Should actually have ongoing health issues or problems to notify us  Thanks-Dr. Nicki Reaper

## 2022-11-12 ENCOUNTER — Other Ambulatory Visit: Payer: Self-pay | Admitting: Family Medicine

## 2022-11-12 ENCOUNTER — Other Ambulatory Visit (HOSPITAL_COMMUNITY): Payer: Self-pay

## 2022-11-12 ENCOUNTER — Other Ambulatory Visit: Payer: Self-pay

## 2022-11-12 MED ORDER — AMOXICILLIN 500 MG PO CAPS
500.0000 mg | ORAL_CAPSULE | Freq: Three times a day (TID) | ORAL | 0 refills | Status: DC
  Filled 2022-11-12: qty 21, 7d supply, fill #0

## 2022-11-12 MED ORDER — HYDROCODONE BIT-HOMATROP MBR 5-1.5 MG/5ML PO SOLN: 5.0000 mL | Freq: Three times a day (TID) | ORAL | 0 refills | Status: DC | PRN

## 2022-11-14 NOTE — Telephone Encounter (Signed)
Please call [place]-she could come here at [address] we will be happy to see her thank you

## 2022-11-15 MED ORDER — CEFDINIR 300 MG PO CAPS: ORAL_CAPSULE | ORAL | 0 refills | Status: DC

## 2022-11-15 MED ORDER — PREDNISONE 20 MG PO TABS: ORAL_TABLET | ORAL | 0 refills | Status: DC

## 2022-11-15 NOTE — Telephone Encounter (Signed)
Pt contacted and verbalized understanding. Pt would like meds to go to [facility] [address]. Medications sent to [facility].

## 2022-11-15 NOTE — Telephone Encounter (Signed)
Nurses Due to logistical issues the previous message was not seen  Please reach out to Betsey it would be fine to go ahead and send in 7 more days of antibiotic Omnicef 300 mg twice daily for 7 days  Plus also for standard situations prednisone 20 mg, 2 daily for 5 days  She may continue to use albuterol as needed if she needs to be rechecked next week have her return To [place]  Unfortunately I am out of the office the rest of today  Thanks-Dr. Nicki Reaper

## 2022-11-18 ENCOUNTER — Other Ambulatory Visit (HOSPITAL_COMMUNITY): Payer: Self-pay

## 2022-11-19 ENCOUNTER — Other Ambulatory Visit (HOSPITAL_COMMUNITY): Payer: Self-pay

## 2022-11-19 DIAGNOSIS — R052 Subacute cough: Secondary | ICD-10-CM | POA: Insufficient documentation

## 2022-11-20 ENCOUNTER — Encounter: Payer: Self-pay | Admitting: Family Medicine

## 2022-11-20 ENCOUNTER — Ambulatory Visit (INDEPENDENT_AMBULATORY_CARE_PROVIDER_SITE_OTHER): Payer: No Typology Code available for payment source | Admitting: Family Medicine

## 2022-11-20 VITALS — BP 121/78 | Wt 166.2 lb

## 2022-11-20 DIAGNOSIS — R058 Other specified cough: Secondary | ICD-10-CM | POA: Diagnosis not present

## 2022-11-20 MED ORDER — BENZONATATE 100 MG PO CAPS: 100.0000 mg | ORAL_CAPSULE | Freq: Three times a day (TID) | ORAL | 0 refills | Status: DC | PRN

## 2022-11-20 MED ORDER — CLARITHROMYCIN 500 MG PO TABS
500.0000 mg | ORAL_TABLET | Freq: Two times a day (BID) | ORAL | 3 refills | Status: DC
  Filled 2022-11-27: qty 14, 7d supply, fill #0
  Filled 2022-12-13: qty 14, 7d supply, fill #1

## 2022-11-20 NOTE — Progress Notes (Signed)
   Subjective:    Patient ID: Marissa Burnett, female    DOB: March 25, 1986, 36 y.o.   MRN: [id number]  HPI Pt arrives due to ongoing cough. Cough has been gong on for several weeks. Pt has been on antibiotics and steroids.  Persistent head congestion drainage coughing been going on for several weeks RSV flu COVID-negative  Review of Systems     Objective:   Physical Exam Gen-NAD not toxic TMS-normal bilateral T- normal no redness Chest-CTA respiratory rate normal no crackles CV RRR no murmur Skin-warm dry Neuro-grossly normal        Assessment & Plan:  Persistent cough I believe this is a viral sequela Recommend Tessalon 3 times daily as needed If worsening symptoms over the next week with progressive discolored phlegm and fever may get handwritten prescription for Biaxin filled for 1 week call us if any problems or ongoing issues

## 2022-11-22 ENCOUNTER — Other Ambulatory Visit (HOSPITAL_COMMUNITY): Payer: Self-pay

## 2022-11-22 ENCOUNTER — Encounter (HOSPITAL_COMMUNITY): Payer: Self-pay

## 2022-11-27 ENCOUNTER — Other Ambulatory Visit (HOSPITAL_BASED_OUTPATIENT_CLINIC_OR_DEPARTMENT_OTHER): Payer: Self-pay

## 2022-12-13 ENCOUNTER — Other Ambulatory Visit: Payer: Self-pay | Admitting: Family Medicine

## 2022-12-13 ENCOUNTER — Other Ambulatory Visit: Payer: Self-pay

## 2022-12-13 ENCOUNTER — Other Ambulatory Visit (HOSPITAL_COMMUNITY): Payer: Self-pay

## 2022-12-13 MED ORDER — OMEPRAZOLE 20 MG PO CPDR
20.0000 mg | DELAYED_RELEASE_CAPSULE | Freq: Every day | ORAL | 0 refills | Status: AC
  Filled 2022-12-13 – 2023-01-16 (×2): qty 30, 30d supply, fill #0

## 2022-12-24 ENCOUNTER — Other Ambulatory Visit (HOSPITAL_COMMUNITY): Payer: Self-pay

## 2023-01-13 ENCOUNTER — Ambulatory Visit (INDEPENDENT_AMBULATORY_CARE_PROVIDER_SITE_OTHER): Payer: 59 | Admitting: Family Medicine

## 2023-01-13 VITALS — BP 133/88 | HR 94 | Temp 98.5°F | Ht 64.0 in | Wt 168.2 lb

## 2023-01-13 DIAGNOSIS — R590 Localized enlarged lymph nodes: Secondary | ICD-10-CM

## 2023-01-13 DIAGNOSIS — Z13220 Encounter for screening for lipoid disorders: Secondary | ICD-10-CM

## 2023-01-13 NOTE — Progress Notes (Signed)
   Subjective:    Patient ID: Marissa Burnett, female    DOB: March 02, 1986, 37 y.o.   MRN: [id number]  HPI Patient arrives today with two nodules on her neck Patient has 2 small nodules on the left side of her neck both are smooth and freely movable.  Patient denies any night sweats denies fevers chills denies weight loss   Review of Systems     Objective:   Physical Exam General-in no acute distress Eyes-no discharge Lungs-respiratory rate normal, CTA CV-no murmurs,RRR Extremities skin warm dry no edema Neuro grossly normal Behavior normal, alert Lymph nodes noted on the left side of the neck both of them are smaller than 5 mm.  Both are smooth soft and freely movable.  No other masses felt       Assessment & Plan:   Ultrasound ordered of these areas Possibility evident to CT scan if inconclusive Do not feel the patient needs any surgical referral currently She had seen ENT Dr.Teoh previously we did not feel that these were sign of any major issues  Patient also under some stress she is caring for her sister's child.  Her sister has some substance abuse problems.  Because of all this has been difficult on the patient as well as family members because it intrafamily stress-I have encouraged her to connect with work psychology for counseling

## 2023-01-14 LAB — CMP14+EGFR
ALT: 16 IU/L (ref 0–32)
AST: 16 IU/L (ref 0–40)
Albumin/Globulin Ratio: 1.9 (ref 1.2–2.2)
Albumin: 4.5 g/dL (ref 3.9–4.9)
Alkaline Phosphatase: 68 IU/L (ref 44–121)
BUN/Creatinine Ratio: 20 (ref 9–23)
BUN: 14 mg/dL (ref 6–20)
Bilirubin Total: 0.4 mg/dL (ref 0.0–1.2)
CO2: 23 mmol/L (ref 20–29)
Calcium: 9.5 mg/dL (ref 8.7–10.2)
Chloride: 102 mmol/L (ref 96–106)
Creatinine, Ser: 0.71 mg/dL (ref 0.57–1.00)
Globulin, Total: 2.4 g/dL (ref 1.5–4.5)
Glucose: 90 mg/dL (ref 70–99)
Potassium: 4 mmol/L (ref 3.5–5.2)
Sodium: 139 mmol/L (ref 134–144)
Total Protein: 6.9 g/dL (ref 6.0–8.5)
eGFR: 112 mL/min/{1.73_m2} (ref >59)

## 2023-01-14 LAB — LIPID PANEL
Chol/HDL Ratio: 2.2 ratio (ref 0.0–4.4)
Cholesterol, Total: 167 mg/dL (ref 100–199)
HDL: 76 mg/dL (ref >39)
LDL Chol Calc (NIH): 67 mg/dL (ref 0–99)
Triglycerides: 140 mg/dL (ref 0–149)
VLDL Cholesterol Cal: 24 mg/dL (ref 5–40)

## 2023-01-14 LAB — CBC WITH DIFFERENTIAL/PLATELET
Basophils Absolute: 0.1 10*3/uL (ref 0.0–0.2)
Basos: 1 %
EOS (ABSOLUTE): 0.2 10*3/uL (ref 0.0–0.4)
Eos: 3 %
Hematocrit: 41.5 % (ref 34.0–46.6)
Hemoglobin: 14 g/dL (ref 11.1–15.9)
Immature Grans (Abs): 0 10*3/uL (ref 0.0–0.1)
Immature Granulocytes: 0 %
Lymphocytes Absolute: 2.4 10*3/uL (ref 0.7–3.1)
Lymphs: 39 %
MCH: 31 pg (ref 26.6–33.0)
MCHC: 33.7 g/dL (ref 31.5–35.7)
MCV: 92 fL (ref 79–97)
Monocytes Absolute: 0.3 10*3/uL (ref 0.1–0.9)
Monocytes: 5 %
Neutrophils Absolute: 3.1 10*3/uL (ref 1.4–7.0)
Neutrophils: 52 %
Platelets: 206 10*3/uL (ref 150–450)
RBC: 4.52 x10E6/uL (ref 3.77–5.28)
RDW: 11.8 % (ref 11.7–15.4)
WBC: 6.1 10*3/uL (ref 3.4–10.8)

## 2023-01-15 ENCOUNTER — Ambulatory Visit (HOSPITAL_BASED_OUTPATIENT_CLINIC_OR_DEPARTMENT_OTHER)
Admission: RE | Admit: 2023-01-15 | Discharge: 2023-01-15 | Disposition: A | Discharge status: Home / Self Care | Payer: 59 | Source: Ambulatory Visit | Attending: Family Medicine | Admitting: Family Medicine

## 2023-01-15 DIAGNOSIS — R590 Localized enlarged lymph nodes: Secondary | ICD-10-CM | POA: Diagnosis not present

## 2023-01-16 ENCOUNTER — Ambulatory Visit (HOSPITAL_COMMUNITY): Payer: 59

## 2023-01-16 ENCOUNTER — Other Ambulatory Visit (HOSPITAL_BASED_OUTPATIENT_CLINIC_OR_DEPARTMENT_OTHER): Payer: Self-pay

## 2023-01-20 ENCOUNTER — Telehealth: Payer: Self-pay

## 2023-01-20 NOTE — Telephone Encounter (Signed)
Called pt to f/u on sclerotherapy results. Pt is overall happy with outcome. Did notice some staining on outer thigh aspect of LLE. She will let us know if any questions/concerns arise.

## 2023-01-21 ENCOUNTER — Encounter: Payer: Self-pay | Admitting: Family Medicine

## 2023-01-21 ENCOUNTER — Telehealth: Payer: Self-pay | Admitting: Family Medicine

## 2023-01-21 ENCOUNTER — Other Ambulatory Visit: Payer: Self-pay

## 2023-01-21 DIAGNOSIS — R590 Localized enlarged lymph nodes: Secondary | ICD-10-CM

## 2023-01-21 NOTE — Telephone Encounter (Signed)
Nurses Please order CT scan soft tissue of the neck with contrast Diagnosis cervical lymphadenopathy Due to the patient's work schedule this needs to be this coming Monday.  It can be at any [facility] health facility.  Very important to get this done in order to workup this issue and may end up needing to have a biopsy.  If having any difficulty scheduling this please let me know.  The last time we ordered an ultrasound patient states she did not get any message regarding when to show up-please if all possible make sure either our office for [facility] directly notifies the patient regarding what time to have a CAT scan next week-due to her work schedule it needs to be Monday thank you

## 2023-01-21 NOTE — Telephone Encounter (Signed)
Orders placed, insurance approval pending.

## 2023-01-27 ENCOUNTER — Encounter (HOSPITAL_BASED_OUTPATIENT_CLINIC_OR_DEPARTMENT_OTHER): Payer: Self-pay

## 2023-01-27 ENCOUNTER — Ambulatory Visit (HOSPITAL_BASED_OUTPATIENT_CLINIC_OR_DEPARTMENT_OTHER)
Admission: RE | Admit: 2023-01-27 | Discharge: 2023-01-27 | Disposition: A | Discharge status: Home / Self Care | Payer: 59 | Source: Ambulatory Visit | Attending: Family Medicine | Admitting: Family Medicine

## 2023-01-27 DIAGNOSIS — R590 Localized enlarged lymph nodes: Secondary | ICD-10-CM | POA: Diagnosis not present

## 2023-01-27 MED ORDER — IOHEXOL 350 MG/ML SOLN
100.0000 mL | Freq: Once | INTRAVENOUS | Status: AC | PRN
  Administered 2023-01-27: 60 mL via INTRAVENOUS

## 2023-01-27 NOTE — Telephone Encounter (Signed)
Patient will be having a [facility] today.

## 2023-01-28 ENCOUNTER — Encounter: Payer: Self-pay | Admitting: Family Medicine

## 2023-02-21 ENCOUNTER — Other Ambulatory Visit: Payer: Self-pay | Admitting: Family Medicine

## 2023-02-21 DIAGNOSIS — Z01419 Encounter for gynecological examination (general) (routine) without abnormal findings: Secondary | ICD-10-CM | POA: Diagnosis not present

## 2023-02-21 DIAGNOSIS — Z01411 Encounter for gynecological examination (general) (routine) with abnormal findings: Secondary | ICD-10-CM | POA: Diagnosis not present

## 2023-02-21 DIAGNOSIS — Z113 Encounter for screening for infections with a predominantly sexual mode of transmission: Secondary | ICD-10-CM | POA: Diagnosis not present

## 2023-02-21 DIAGNOSIS — Z124 Encounter for screening for malignant neoplasm of cervix: Secondary | ICD-10-CM | POA: Diagnosis not present

## 2023-02-21 DIAGNOSIS — Z6827 Body mass index (BMI) 27.0-27.9, adult: Secondary | ICD-10-CM | POA: Diagnosis not present

## 2023-03-19 ENCOUNTER — Other Ambulatory Visit (HOSPITAL_COMMUNITY): Payer: 59

## 2023-03-22 ENCOUNTER — Ambulatory Visit: Payer: 59

## 2023-03-22 DIAGNOSIS — B349 Viral infection, unspecified: Secondary | ICD-10-CM | POA: Insufficient documentation

## 2023-03-24 ENCOUNTER — Ambulatory Visit (HOSPITAL_BASED_OUTPATIENT_CLINIC_OR_DEPARTMENT_OTHER): Payer: 59

## 2023-04-10 ENCOUNTER — Other Ambulatory Visit (HOSPITAL_BASED_OUTPATIENT_CLINIC_OR_DEPARTMENT_OTHER): Payer: Self-pay

## 2023-04-23 ENCOUNTER — Encounter: Payer: Self-pay | Admitting: Family Medicine

## 2023-05-19 ENCOUNTER — Telehealth: Payer: Self-pay | Admitting: Cardiovascular Disease

## 2023-05-19 DIAGNOSIS — Q211 Atrial septal defect, unspecified: Secondary | ICD-10-CM

## 2023-05-19 NOTE — Telephone Encounter (Signed)
Spoke with patient and informed her Dr. Excell Seltzer is agreeable to having an echo performed prior to appt on 10/16/23.  Scheduler will call patient to setup appt date/time for echo.  Patient verbalized understanding and expressed appreciation for call.

## 2023-05-19 NOTE — Telephone Encounter (Signed)
Agree. Thanks Danford Bad.

## 2023-05-19 NOTE — Telephone Encounter (Signed)
Returned call to patient. She is requesting to have an echocardiogram performed prior to appt with Dr. Excell Seltzer in October. She states she would like to be able to stay a patient of his, and having this echocardiogram prior to her appt would give her peace of mind.  She states if Dr. Excell Seltzer does not want to do the echo now she can extend it out, but would prefer to have it done prior to October.  Will forward to Dr. Excell Seltzer to advise.

## 2023-05-19 NOTE — Telephone Encounter (Signed)
Pt would like a call back regarding wanting to know if she should have an ECHO done before coming to her appt because that's usually what happens. Please advise

## 2023-06-19 DIAGNOSIS — R599 Enlarged lymph nodes, unspecified: Secondary | ICD-10-CM | POA: Diagnosis not present

## 2023-07-14 ENCOUNTER — Telehealth: Payer: Self-pay

## 2023-07-14 NOTE — Telephone Encounter (Signed)
Pt is calling she is going on a cruse and needs nausea patches called into [facility] [address]

## 2023-07-14 NOTE — Telephone Encounter (Signed)
Nurses-scopolamine patches, 1 box, apply patch approximately 2 hours before the cruise then change the patch every 3 days, discontinue patch if severe dizziness vomiting headaches

## 2023-07-15 ENCOUNTER — Other Ambulatory Visit: Payer: Self-pay

## 2023-07-15 DIAGNOSIS — R110 Nausea: Secondary | ICD-10-CM

## 2023-07-15 MED ORDER — SCOPOLAMINE 1 MG/3DAYS TD PT72: MEDICATED_PATCH | TRANSDERMAL | 0 refills | Status: DC

## 2023-07-24 ENCOUNTER — Other Ambulatory Visit: Payer: Self-pay | Admitting: Family Medicine

## 2023-10-09 IMAGING — US US SOFT TISSUE HEAD/NECK
1 series · 10 of 10 positions shown · non-contrast
Comparison: Head and neck CTA 07/27/2020

CLINICAL DATA: Left neck mass.

EXAM:
ULTRASOUND OF HEAD/NECK SOFT TISSUES
TECHNIQUE: Ultrasound examination of the head and neck soft tissues was
performed in the area of clinical concern.

[Series 1: us soft tissue head & neck (non-thyroid) · 10 acquisitions, 10 frames shown]
[im 1/10]
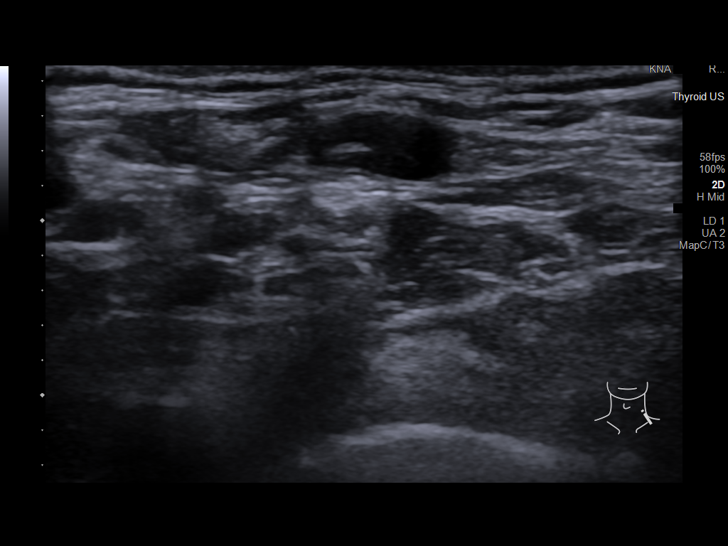
[im 2/10]
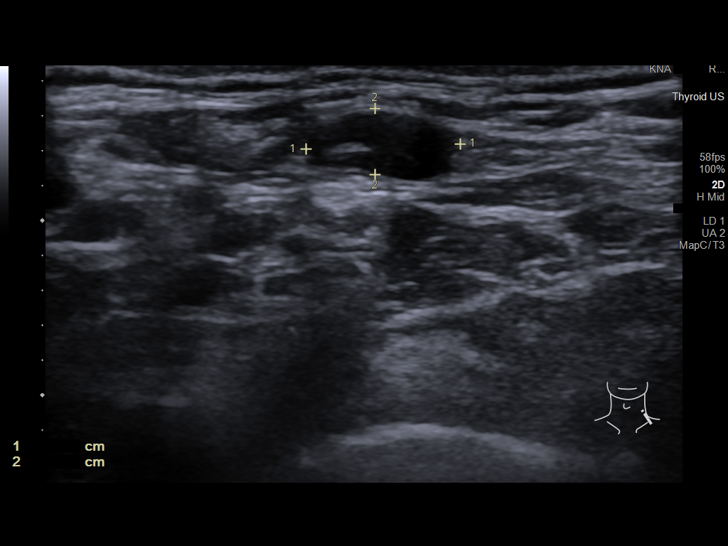
[im 3/10]
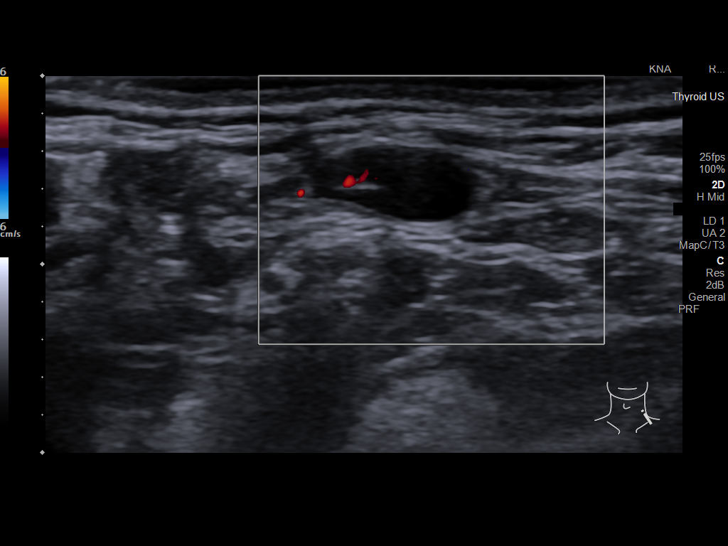
[im 4/10]
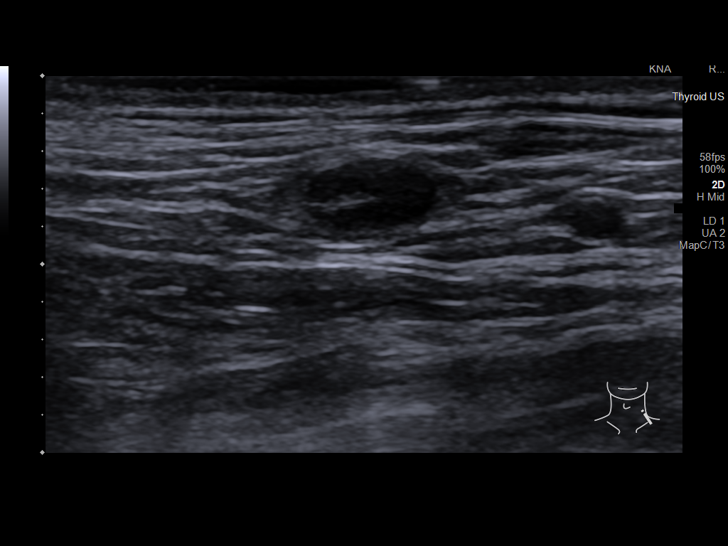
[im 5/10]
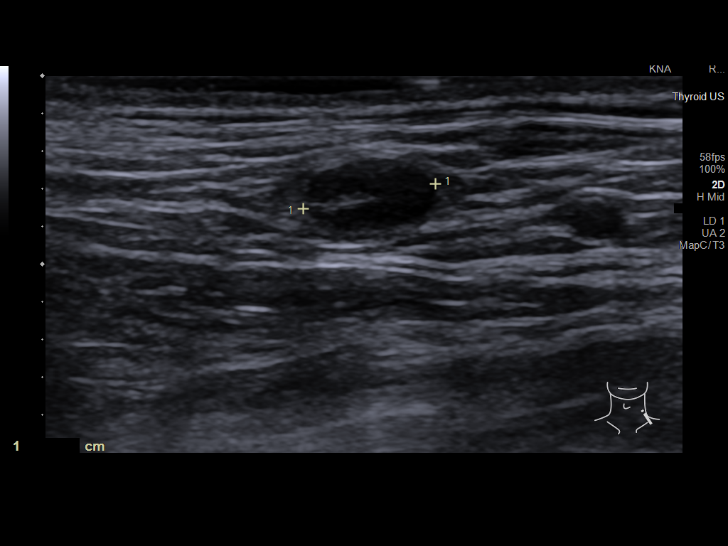
[im 6/10]
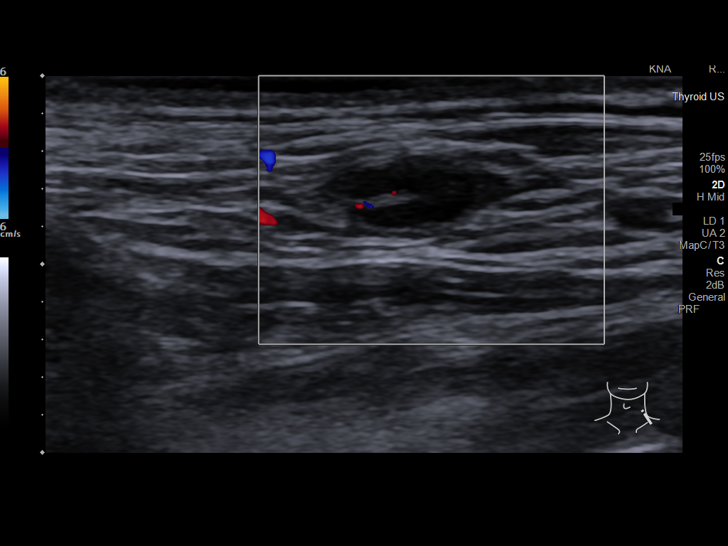
[im 7/10]
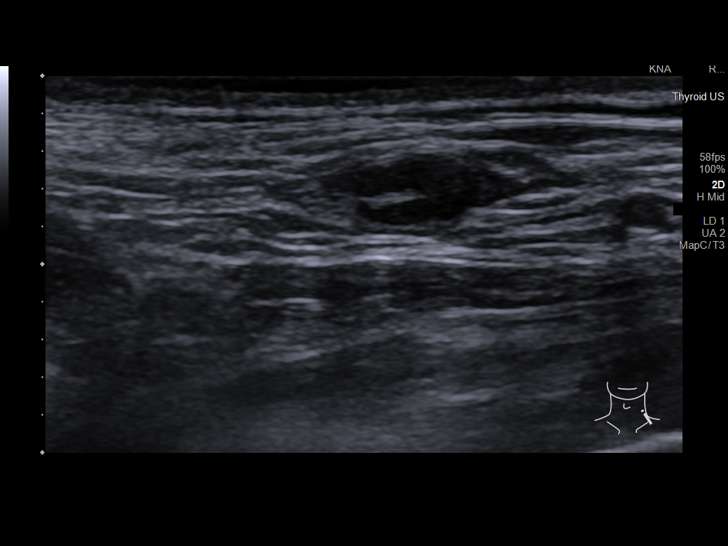
[im 8/10]
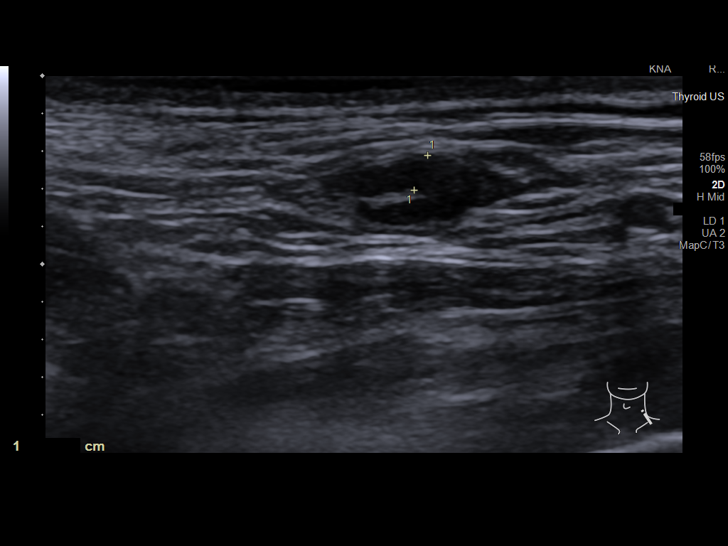
[im 9/10]
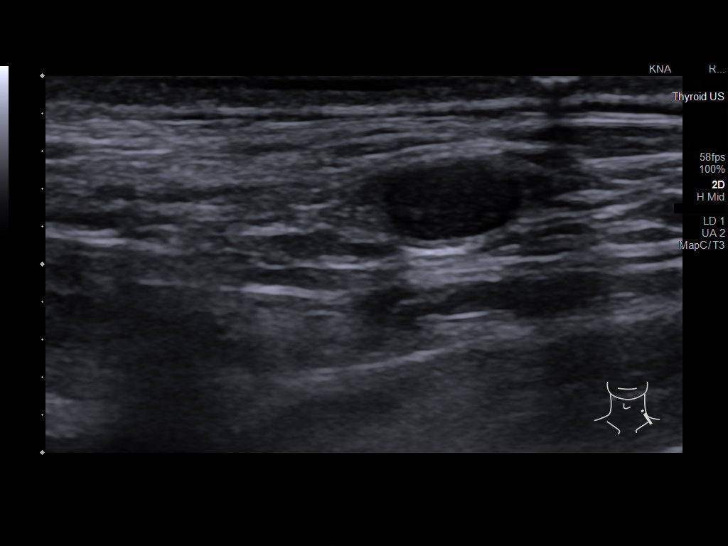
[im 10/10]
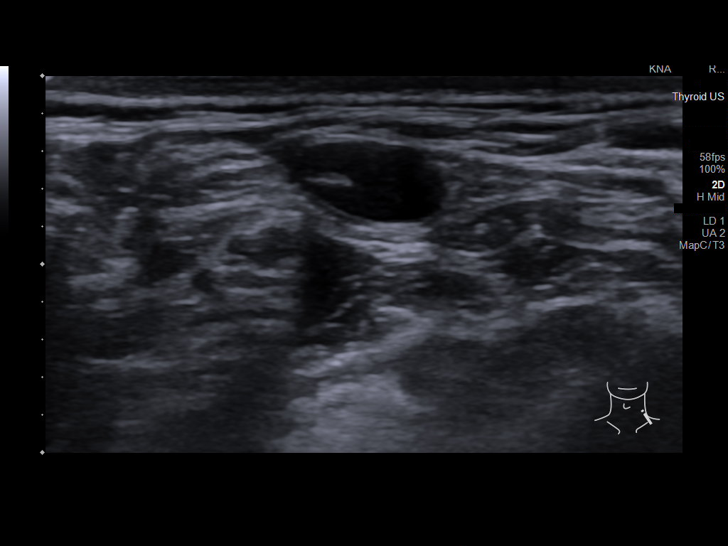

[10 of 10 positions shown; findings below may reference images not displayed]

FINDINGS: The palpable abnormality in the left lower neck corresponds to a 7 x
9 x 4 mm lymph node. There is a visible fatty hilum, although the
cortex appears mildly thickened. No cystic changes are present, and
no other abnormality is identified in this region.
IMPRESSION: Subcentimeter lymph node in the left lower neck with mild cortical
thickening but no other suspicious features, potentially reactive.
Recommend clinical follow-up with consideration for repeat
ultrasound or neck CT if the node enlarges further.

## 2023-10-16 ENCOUNTER — Encounter: Payer: Self-pay | Admitting: Cardiovascular Disease

## 2023-10-16 ENCOUNTER — Ambulatory Visit (HOSPITAL_COMMUNITY): Payer: 59 | Attending: Cardiovascular Disease | Admitting: Cardiovascular Disease

## 2023-10-16 ENCOUNTER — Telehealth: Payer: Self-pay

## 2023-10-16 ENCOUNTER — Ambulatory Visit (HOSPITAL_BASED_OUTPATIENT_CLINIC_OR_DEPARTMENT_OTHER): Payer: 59

## 2023-10-16 VITALS — BP 128/90 | HR 86 | Ht 64.0 in | Wt 163.2 lb

## 2023-10-16 DIAGNOSIS — I5189 Other ill-defined heart diseases: Secondary | ICD-10-CM | POA: Insufficient documentation

## 2023-10-16 DIAGNOSIS — Q211 Atrial septal defect, unspecified: Secondary | ICD-10-CM

## 2023-10-16 DIAGNOSIS — R002 Palpitations: Secondary | ICD-10-CM | POA: Diagnosis not present

## 2023-10-16 LAB — ECHOCARDIOGRAM LIMITED BUBBLE STUDY
Area-P 1/2: 2.73 cm2
S' Lateral: 2.9 cm

## 2023-10-16 NOTE — Patient Instructions (Signed)
Medication Instructions:  Your physician recommends that you continue on your current medications as directed. Please refer to the Current Medication list given to you today.  Follow-Up: At [facility], you and your health needs are our priority.  As part of our continuing mission to provide you with exceptional heart care, we have created designated Provider Care Teams.  These Care Teams include your primary Cardiologist (physician) and Advanced Practice Providers (APPs -  Physician Assistants and Nurse Practitioners) who all work together to provide you with the care you need, when you need it.  Your next appointment:   1 year(s)  Provider:   Tonny Bollman, MD

## 2023-10-16 NOTE — Assessment & Plan Note (Signed)
Stable, asymptomatic at this time.  EKG shows no significant ectopy.  She has been able to wean herself off of the beta-blocker and is having no problems at present.

## 2023-10-16 NOTE — Assessment & Plan Note (Signed)
The patient is status post ASD closure.  I reviewed her echo images which show appropriate device position with her atrial septal occluder.  A bubble study is positive for a small late right-to-left shunt which I suspect is more indicative of pulmonary AVMs and not clinically significant.  I do not see any bubbles crossing the interatrial septum at rest or with Valsalva.

## 2023-10-16 NOTE — Telephone Encounter (Signed)
Pt arrived for ECHO today and is requesting a bubble study instead of normal. Per Excell Seltzer in clinic, he's fine with this. Bubble study ordered at this time.

## 2023-10-16 NOTE — Progress Notes (Signed)
Cardiology Office Note:    Date:  10/16/2023   ID:  Marissa Burnett, DOB June 18, 1986, MRN [id number]  PCP:  Babs Sciara, MD   [facility] Providers Cardiologist:  Tonny Bollman, MD     Referring MD: Babs Sciara, MD   Chief Complaint  Patient presents with   Follow-up    PFO s/p closure    History of Present Illness:    Marissa Burnett is a 37 y.o. female with a hx of:  Hx of Migraine HAs w/ Aura Hx of L MCA territory CVA (2-3 punctate ischemic infarcts) Ostium Secundum ASD  S/p closure 08/30/2020  Nickel allergy  Incomplete RBBB Premature atrial contractions, symptomatic Briefly admitted 08/2020 after ASD closure Seen by EP 09/2020 >> beta-blocker Rx   The patient is here alone today.  She has been doing well.  She and her husband are raising their 44-year-old nephew.  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  She denies chest pain, heart palpitations, or shortness of breath.  Continues on low-dose aspirin.  Current Medications: Current Meds  Medication Sig   acetaminophen (TYLENOL) 500 MG tablet Take 1,000 mg by mouth daily as needed for headache.    ALPRAZolam (XANAX) 0.5 MG tablet TAKE 1 TABLET(0.5 MG) BY MOUTH TWICE DAILY AS NEEDED FOR ANXIETY   aspirin (ASPIRIN LOW DOSE) 81 MG EC tablet TAKE 1 TABLET (81 MG TOTAL) BY MOUTH DAILY. SWALLOW WHOLE.   ASPIRIN 81 PO Aspirin 81   BIOTIN PO Take 1 tablet by mouth daily.    chlorzoxazone (PARAFON) 500 MG tablet Take 500 mg by mouth as directed.   [employer] SUNSCREEN SPF 30 EX apply topically to face and body daily for 30   EPINEPHrine 0.3 mg/0.3 mL IJ SOAJ injection Inject 0.3 mg into the muscle once as needed for up to 1 dose for anaphylaxis.   famotidine (PEPCID) 20 MG tablet Take 1 tablet (20 mg total) by mouth 2 (two) times daily as needed for heartburn or indigestion.   Loratadine 10 MG CHEW Chew 10 mg by mouth daily at 6 (six) AM.   omeprazole (PRILOSEC) 20 MG capsule Take  1 capsule (20 mg total) by mouth daily.   Rimegepant Sulfate (NURTEC) 75 MG TBDP Take 1 tablet (75 mg) by mouth at first sign of migraine. Do not take more than 1 every 24 hours.   tretinoin (RETIN-A) 0.025 % cream Apply a pea size amount to the entire face. Can mix with moisturizer to avoid irritation. Start using every other night for 7-14 days, then every night thereafter   [DISCONTINUED] AMOXICILLIN PO 1 capsule oral 10 days  2 times a day   [DISCONTINUED] benzonatate (TESSALON) 100 MG capsule Take 1 capsule (100 mg total) by mouth 3 (three) times daily as needed for cough.   [DISCONTINUED] cefdinir (OMNICEF) 300 MG capsule Take 1 capsule by mouth 2 (two) times daily.   [DISCONTINUED] cefPROZIL (CEFZIL) 500 MG tablet Take 1 tablet by mouth 2 (two) times daily.   [DISCONTINUED] cephALEXin (KEFLEX) 500 MG capsule Take 500 mg by mouth.   [DISCONTINUED] diclofenac (VOLTAREN) 75 MG EC tablet Take 1 tablet by mouth 2 (two) times daily.   [DISCONTINUED] HYDROcodone bit-homatropine (HYCODAN) 5-1.5 MG/5ML syrup Take 5 mLs by mouth every 8 (eight) hours as needed for cough.   [DISCONTINUED] hydrocortisone cream 0.5 % Apply 1 Application topically 4 (four) times daily.   [DISCONTINUED] Loratadine (CLARITIN) 10 MG CAPS Claritin   [  DISCONTINUED] loratadine (CLARITIN) 10 MG tablet Take 10 mg by mouth daily.   [DISCONTINUED] minocycline (MINOCIN) 50 MG capsule Take 50 mg by mouth as directed.   [DISCONTINUED] OSELTAMIVIR PHOSPHATE PO    [DISCONTINUED] phentermine 37.5 MG capsule Take 37.5 mg by mouth every morning.   [DISCONTINUED] Polyvinyl Alcohol-Povidone (REFRESH OP) Place 1 drop into both eyes daily as needed (dry eyes).   [DISCONTINUED] predniSONE (DELTASONE) 20 MG tablet Take 2 tablets po daily for 5 days   [DISCONTINUED] scopolamine (TRANSDERM-SCOP) 1 MG/3DAYS apply 1 patch approximately 72 hours before the cruise then change the patch every 3 days, discontinue patch if severe dizziness vomiting  headaches   [DISCONTINUED] silver sulfADIAZINE (SSD) 1 % cream Apply 1 Application topically 2 (two) times daily.   [DISCONTINUED] tacrolimus (PROTOPIC) 0.1 % ointment Apply topically at bedtime.   [DISCONTINUED] triamcinolone cream (KENALOG) 0.1 % Apply time amount twice daily to affected areas over the course of next 5-7 days. Do not use on facial area     Allergies:   Peanut-containing drug products   ROS:   Please see the history of present illness.    All other systems reviewed and are negative.  EKGs/Labs/Other Studies Reviewed:    The following studies were reviewed today: Cardiac Studies & Procedures   CARDIAC CATHETERIZATION  CARDIAC CATHETERIZATION 08/30/2020  Narrative Successful transcatheter ASD closure using an 18 mm Amplatzer septal occluder device with fluoroscopic and intracardiac echo guidance     ECHOCARDIOGRAM  ECHOCARDIOGRAM LIMITED BUBBLE STUDY 10/16/2023  Narrative ECHOCARDIOGRAM LIMITED REPORT    Patient Name:   Marissa Burnett Date of Exam: 10/16/2023 Medical Rec #:  [id number]        Height:       64.0 in Accession #:    [id number]       Weight:       168.2 lb Date of Birth:  07-Dec-1986        BSA:          1.818 m Patient Age:    37 years         BP:           133/88 mmHg Patient Gender: F                HR:           107 bpm. Exam Location:  [facility]  Procedure: Limited Echo, Limited Color Doppler, Cardiac Doppler and Saline Contrast Bubble Study  Indications:    Q21.10 ASD  History:        Patient has prior history of Echocardiogram examinations, most recent 03/18/2022. TIA; ASD (post closure).  Sonographer:    Samule Ohm RDCS Referring Phys: [phone] Amair Shrout  IMPRESSIONS   1. Left ventricular ejection fraction, by estimation, is 60 to 65%. The left ventricle has normal function. Left ventricular diastolic parameters are consistent with Grade I diastolic dysfunction (impaired relaxation). 2. Right ventricular systolic  function is normal. The right ventricular size is normal. Tricuspid regurgitation signal is inadequate for assessing PA pressure. 3. The aortic valve is tricuspid. Aortic valve regurgitation is not visualized. No aortic stenosis is present. 4. The inferior vena cava is normal in size with greater than 50% respiratory variability, suggesting right atrial pressure of 3 mmHg. 5. There is an Amplatzer PFO closure device present. Bubble study shows persistent bubbles crossing the interatrial septum.  FINDINGS Left Ventricle: Left ventricular ejection fraction, by estimation, is 60 to 65%. The left ventricle has normal function. The  left ventricular internal cavity size was normal in size. There is no left ventricular hypertrophy. Left ventricular diastolic parameters are consistent with Grade I diastolic dysfunction (impaired relaxation).  Right Ventricle: The right ventricular size is normal. No increase in right ventricular wall thickness. Right ventricular systolic function is normal. Tricuspid regurgitation signal is inadequate for assessing PA pressure.  Right Atrium: Right atrial size was normal in size.  Tricuspid Valve: The tricuspid valve is normal in structure. Tricuspid valve regurgitation is not demonstrated.  Aortic Valve: The aortic valve is tricuspid. Aortic valve regurgitation is not visualized. No aortic stenosis is present.  Venous: The inferior vena cava is normal in size with greater than 50% respiratory variability, suggesting right atrial pressure of 3 mmHg.  IAS/Shunts: There is an Amplatzer PFO closure device present. Bubble study shows persistent bubbles crossing the interatrial septum. Agitated saline contrast was given intravenously to evaluate for intracardiac shunting.  LEFT VENTRICLE PLAX 2D LVIDd:         4.30 cm Diastology LVIDs:         2.90 cm LV e' medial:    12.00 cm/s LV PW:         0.90 cm LV E/e' medial:  7.5 LV IVS:        0.90 cm LV e' lateral:   22.10  cm/s LV E/e' lateral: 4.1   LEFT ATRIUM         Index       RIGHT ATRIUM LA diam:    3.80 cm 2.09 cm/m  RA Pressure: 3.00 mmHg AORTIC VALVE LVOT Vmax:   132.00 cm/s LVOT Vmean:  85.200 cm/s LVOT VTI:    0.228 m  MITRAL VALVE               TRICUSPID VALVE MV Area (PHT): 2.73 cm    Estimated RAP:  3.00 mmHg MV Decel Time: 278 msec MV E velocity: 89.60 cm/s  SHUNTS MV A velocity: 94.30 cm/s  Systemic VTI: 0.23 m MV E/A ratio:  0.95  Dalton McleanMD Electronically signed by Wilfred Lacy Signature Date/Time: 10/16/2023/1:29:40 PM    Final   TEE  ECHO TEE 08/04/2020  Narrative TRANSESOPHOGEAL ECHO REPORT    Patient Name:   SHAYA REDDICK Date of Exam: 08/04/2020 Medical Rec #:  [id number]        Height:       64.0 in Accession #:    [id number]       Weight:       159.6 lb Date of Birth:  [birth year]        BSA:          1.777 m Patient Age:    33 years         BP:           135/103 mmHg Patient Gender: F                HR:           102 bpm. Exam Location:  Inpatient  Procedure: Transesophageal Echo, Color Doppler, Cardiac Doppler and 3D Echo  Indications:     Q21.1 ASD  History:         Patient has prior history of Echocardiogram examinations, most recent 07/28/2020.  Sonographer:     Irving Burton Senior RDCS Referring Phys:  [id number] MCLEAN Diagnosing Phys: Marca Ancona MD  PROCEDURE: After discussion of the risks and benefits of a TEE, an informed consent was obtained from the patient. The transesophogeal  DISCONTINUED] loratadine (CLARITIN) 10 MG tablet Take 10 mg by mouth daily.   [DISCONTINUED] minocycline (MINOCIN) 50 MG capsule Take 50 mg by mouth as directed.   [DISCONTINUED] OSELTAMIVIR PHOSPHATE PO    [DISCONTINUED] phentermine 37.5 MG capsule Take 37.5 mg by mouth every morning.   [DISCONTINUED] Polyvinyl Alcohol-Povidone (REFRESH OP) Place 1 drop into both eyes daily as needed (dry eyes).   [DISCONTINUED] predniSONE (DELTASONE) 20 MG tablet Take 2 tablets po daily for 5 days   [DISCONTINUED] scopolamine (TRANSDERM-SCOP) 1 MG/3DAYS apply 1 patch approximately 72 hours before the cruise then change the patch every 3 days, discontinue patch if severe dizziness vomiting  headaches   [DISCONTINUED] silver sulfADIAZINE (SSD) 1 % cream Apply 1 Application topically 2 (two) times daily.   [DISCONTINUED] tacrolimus (PROTOPIC) 0.1 % ointment Apply topically at bedtime.   [DISCONTINUED] triamcinolone cream (KENALOG) 0.1 % Apply time amount twice daily to affected areas over the course of next 5-7 days. Do not use on facial area     Allergies:   Peanut-containing drug products   ROS:   Please see the history of present illness.    All other systems reviewed and are negative.  EKGs/Labs/Other Studies Reviewed:    The following studies were reviewed today: Cardiac Studies & Procedures   CARDIAC CATHETERIZATION  CARDIAC CATHETERIZATION 08/30/2020  Narrative Successful transcatheter ASD closure using an 18 mm Amplatzer septal occluder device with fluoroscopic and intracardiac echo guidance     ECHOCARDIOGRAM  ECHOCARDIOGRAM LIMITED BUBBLE STUDY 10/16/2023  Narrative ECHOCARDIOGRAM LIMITED REPORT    Patient Name:   Marissa Burnett Date of Exam: 10/16/2023 Medical Rec #:  [id number]        Height:       64.0 in Accession #:    [id number]       Weight:       168.2 lb Date of Birth:  07-Dec-1986        BSA:          1.818 m Patient Age:    37 years         BP:           133/88 mmHg Patient Gender: F                HR:           107 bpm. Exam Location:  [facility]  Procedure: Limited Echo, Limited Color Doppler, Cardiac Doppler and Saline Contrast Bubble Study  Indications:    Q21.10 ASD  History:        Patient has prior history of Echocardiogram examinations, most recent 03/18/2022. TIA; ASD (post closure).  Sonographer:    Samule Ohm RDCS Referring Phys: [phone] Amair Shrout  IMPRESSIONS   1. Left ventricular ejection fraction, by estimation, is 60 to 65%. The left ventricle has normal function. Left ventricular diastolic parameters are consistent with Grade I diastolic dysfunction (impaired relaxation). 2. Right ventricular systolic  function is normal. The right ventricular size is normal. Tricuspid regurgitation signal is inadequate for assessing PA pressure. 3. The aortic valve is tricuspid. Aortic valve regurgitation is not visualized. No aortic stenosis is present. 4. The inferior vena cava is normal in size with greater than 50% respiratory variability, suggesting right atrial pressure of 3 mmHg. 5. There is an Amplatzer PFO closure device present. Bubble study shows persistent bubbles crossing the interatrial septum.  FINDINGS Left Ventricle: Left ventricular ejection fraction, by estimation, is 60 to 65%. The left ventricle has normal function. The  DISCONTINUED] loratadine (CLARITIN) 10 MG tablet Take 10 mg by mouth daily.   [DISCONTINUED] minocycline (MINOCIN) 50 MG capsule Take 50 mg by mouth as directed.   [DISCONTINUED] OSELTAMIVIR PHOSPHATE PO    [DISCONTINUED] phentermine 37.5 MG capsule Take 37.5 mg by mouth every morning.   [DISCONTINUED] Polyvinyl Alcohol-Povidone (REFRESH OP) Place 1 drop into both eyes daily as needed (dry eyes).   [DISCONTINUED] predniSONE (DELTASONE) 20 MG tablet Take 2 tablets po daily for 5 days   [DISCONTINUED] scopolamine (TRANSDERM-SCOP) 1 MG/3DAYS apply 1 patch approximately 72 hours before the cruise then change the patch every 3 days, discontinue patch if severe dizziness vomiting  headaches   [DISCONTINUED] silver sulfADIAZINE (SSD) 1 % cream Apply 1 Application topically 2 (two) times daily.   [DISCONTINUED] tacrolimus (PROTOPIC) 0.1 % ointment Apply topically at bedtime.   [DISCONTINUED] triamcinolone cream (KENALOG) 0.1 % Apply time amount twice daily to affected areas over the course of next 5-7 days. Do not use on facial area     Allergies:   Peanut-containing drug products   ROS:   Please see the history of present illness.    All other systems reviewed and are negative.  EKGs/Labs/Other Studies Reviewed:    The following studies were reviewed today: Cardiac Studies & Procedures   CARDIAC CATHETERIZATION  CARDIAC CATHETERIZATION 08/30/2020  Narrative Successful transcatheter ASD closure using an 18 mm Amplatzer septal occluder device with fluoroscopic and intracardiac echo guidance     ECHOCARDIOGRAM  ECHOCARDIOGRAM LIMITED BUBBLE STUDY 10/16/2023  Narrative ECHOCARDIOGRAM LIMITED REPORT    Patient Name:   Marissa Burnett Date of Exam: 10/16/2023 Medical Rec #:  [id number]        Height:       64.0 in Accession #:    [id number]       Weight:       168.2 lb Date of Birth:  07-Dec-1986        BSA:          1.818 m Patient Age:    37 years         BP:           133/88 mmHg Patient Gender: F                HR:           107 bpm. Exam Location:  [facility]  Procedure: Limited Echo, Limited Color Doppler, Cardiac Doppler and Saline Contrast Bubble Study  Indications:    Q21.10 ASD  History:        Patient has prior history of Echocardiogram examinations, most recent 03/18/2022. TIA; ASD (post closure).  Sonographer:    Samule Ohm RDCS Referring Phys: [phone] Amair Shrout  IMPRESSIONS   1. Left ventricular ejection fraction, by estimation, is 60 to 65%. The left ventricle has normal function. Left ventricular diastolic parameters are consistent with Grade I diastolic dysfunction (impaired relaxation). 2. Right ventricular systolic  function is normal. The right ventricular size is normal. Tricuspid regurgitation signal is inadequate for assessing PA pressure. 3. The aortic valve is tricuspid. Aortic valve regurgitation is not visualized. No aortic stenosis is present. 4. The inferior vena cava is normal in size with greater than 50% respiratory variability, suggesting right atrial pressure of 3 mmHg. 5. There is an Amplatzer PFO closure device present. Bubble study shows persistent bubbles crossing the interatrial septum.  FINDINGS Left Ventricle: Left ventricular ejection fraction, by estimation, is 60 to 65%. The left ventricle has normal function. The

## 2023-10-17 ENCOUNTER — Encounter: Payer: Self-pay | Admitting: Cardiovascular Disease

## 2023-11-26 ENCOUNTER — Encounter: Payer: Self-pay | Admitting: *Deleted

## 2024-02-26 DIAGNOSIS — Z01411 Encounter for gynecological examination (general) (routine) with abnormal findings: Secondary | ICD-10-CM | POA: Diagnosis not present

## 2024-02-26 DIAGNOSIS — Z1331 Encounter for screening for depression: Secondary | ICD-10-CM | POA: Diagnosis not present

## 2024-02-26 DIAGNOSIS — Z113 Encounter for screening for infections with a predominantly sexual mode of transmission: Secondary | ICD-10-CM | POA: Diagnosis not present

## 2024-02-26 DIAGNOSIS — Z01419 Encounter for gynecological examination (general) (routine) without abnormal findings: Secondary | ICD-10-CM | POA: Diagnosis not present

## 2024-02-26 DIAGNOSIS — Z124 Encounter for screening for malignant neoplasm of cervix: Secondary | ICD-10-CM | POA: Diagnosis not present

## 2024-05-25 ENCOUNTER — Other Ambulatory Visit: Payer: Self-pay | Admitting: Family Medicine

## 2024-08-16 ENCOUNTER — Telehealth: Payer: Self-pay | Admitting: *Deleted

## 2024-08-16 ENCOUNTER — Other Ambulatory Visit: Payer: Self-pay | Admitting: Family Medicine

## 2024-08-16 ENCOUNTER — Encounter: Payer: Self-pay | Admitting: Family Medicine

## 2024-08-16 MED ORDER — ALPRAZOLAM 0.5 MG PO TABS: ORAL_TABLET | ORAL | 1 refills | Status: DC

## 2024-08-16 NOTE — Telephone Encounter (Unsigned)
 Copied from CRM #[id number]. Topic: Clinical - Medication Question >> Aug 16, 2024  9:59 AM Emylou G wrote: Reason for CRM: Patient.[name] anxious.[name] ordering xanax  last minute.. -- looking for rush.. leaving the country tomorrow she says.[name]

## 2024-08-16 NOTE — Telephone Encounter (Signed)
 I sent the refills in and also sent her a MyChart message which she has read thank you

## 2024-10-26 ENCOUNTER — Encounter: Payer: Self-pay | Admitting: Family Medicine

## 2025-01-03 ENCOUNTER — Other Ambulatory Visit: Payer: Self-pay

## 2025-01-03 ENCOUNTER — Telehealth: Payer: Self-pay | Admitting: Cardiovascular Disease

## 2025-01-03 DIAGNOSIS — Q211 Atrial septal defect, unspecified: Secondary | ICD-10-CM

## 2025-01-03 NOTE — Telephone Encounter (Signed)
 Spoke with Pt. Pt states she is having no issues at all just wanted to see if she needed a repeat Echo. Pt would like to wait for a appointment with Dr Wonda until after she has the next echo and stated she would be fine with waiting for appointment if she does not need Echo now. Advised pt I was send to DrCooper for review.

## 2025-01-03 NOTE — Telephone Encounter (Signed)
 Patient called stating she would like to have an order for an echo, she had on done last year.

## 2025-01-20 ENCOUNTER — Other Ambulatory Visit: Payer: Self-pay | Admitting: Family Medicine

## 2025-01-28 ENCOUNTER — Ambulatory Visit: Admitting: Nurse Practitioner
